# Patient Record
Sex: Female | Born: 1995 | Race: Black or African American | Hispanic: No | Marital: Single | Smoking: Former smoker
Health system: Southern US, Community
[De-identification: ages and names within clinical notes are randomized; demographics above are authoritative.]

## PROBLEM LIST (undated history)

## (undated) ENCOUNTER — Inpatient Hospital Stay (HOSPITAL_COMMUNITY): Payer: Self-pay

## (undated) ENCOUNTER — Ambulatory Visit

## (undated) ENCOUNTER — Emergency Department (HOSPITAL_COMMUNITY): Admission: EM | Payer: Medicaid Other

## (undated) DIAGNOSIS — G43909 Migraine, unspecified, not intractable, without status migrainosus: Secondary | ICD-10-CM

## (undated) HISTORY — PX: HERNIA REPAIR: SHX51

---
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
## 2016-01-01 LAB — GC/CHLAMYDIA PROBE AMP (~~LOC~~) NOT AT ARMC
Chlamydia: NEGATIVE
Neisseria Gonorrhea: NEGATIVE

## 2016-01-01 LAB — WET PREP, GENITAL: Trich, Wet Prep: NONE SEEN

## 2016-01-05 ENCOUNTER — Telehealth: Payer: Self-pay | Admitting: *Deleted

## 2016-01-05 NOTE — Telephone Encounter (Addendum)
Called pt and heard message stating that the person being called cannot accept calls at this time - unable to leave message. Per Dr.Anyanwu pt needs to be informed of wet prep result showing yeast. Rx has been sent to her pharmacy. She may repeat dose if symptoms persist after 3 days.   2/9  1625  Called pt and heard same message stating that the person called cannot accept calls at this time. Certified letter sent.

## 2016-01-07 ENCOUNTER — Encounter: Payer: Self-pay | Admitting: *Deleted

## 2016-06-22 ENCOUNTER — Encounter: Payer: Self-pay | Admitting: Pediatrics

## 2016-06-23 ENCOUNTER — Encounter: Payer: Self-pay | Admitting: Pediatrics

## 2016-07-06 ENCOUNTER — Ambulatory Visit (INDEPENDENT_AMBULATORY_CARE_PROVIDER_SITE_OTHER): Payer: Medicaid Other

## 2016-07-06 ENCOUNTER — Encounter: Payer: Self-pay | Admitting: Family Medicine

## 2016-07-06 DIAGNOSIS — Z3490 Encounter for supervision of normal pregnancy, unspecified, unspecified trimester: Secondary | ICD-10-CM

## 2016-07-06 DIAGNOSIS — Z3201 Encounter for pregnancy test, result positive: Secondary | ICD-10-CM

## 2016-07-06 LAB — POCT PREGNANCY, URINE: Preg Test, Ur: POSITIVE — AB

## 2016-07-06 NOTE — Progress Notes (Signed)
Patient presented to office today for a pregnancy test. Test confirms she is pregnant around five weeks. Patient has been given a pregnancy verification letter to help with applying for pregnancy medicaid. Patient is currently not taking any medications at this time.

## 2016-07-14 ENCOUNTER — Encounter (HOSPITAL_COMMUNITY): Payer: Self-pay | Admitting: *Deleted

## 2016-07-14 ENCOUNTER — Inpatient Hospital Stay (HOSPITAL_COMMUNITY)
Admission: AD | Admit: 2016-07-14 | Discharge: 2016-07-14 | Disposition: A | Discharge status: Home / Self Care | Payer: Medicaid Other | Source: Ambulatory Visit | Attending: Obstetrics & Gynecology | Admitting: Obstetrics & Gynecology

## 2016-07-14 ENCOUNTER — Inpatient Hospital Stay (HOSPITAL_COMMUNITY): Payer: Medicaid Other

## 2016-07-14 DIAGNOSIS — R1110 Vomiting, unspecified: Secondary | ICD-10-CM | POA: Diagnosis present

## 2016-07-14 DIAGNOSIS — Z87891 Personal history of nicotine dependence: Secondary | ICD-10-CM | POA: Diagnosis not present

## 2016-07-14 DIAGNOSIS — R109 Unspecified abdominal pain: Secondary | ICD-10-CM

## 2016-07-14 DIAGNOSIS — O219 Vomiting of pregnancy, unspecified: Secondary | ICD-10-CM | POA: Insufficient documentation

## 2016-07-14 DIAGNOSIS — O99281 Endocrine, nutritional and metabolic diseases complicating pregnancy, first trimester: Secondary | ICD-10-CM | POA: Insufficient documentation

## 2016-07-14 DIAGNOSIS — O26899 Other specified pregnancy related conditions, unspecified trimester: Secondary | ICD-10-CM

## 2016-07-14 DIAGNOSIS — E860 Dehydration: Secondary | ICD-10-CM | POA: Insufficient documentation

## 2016-07-14 DIAGNOSIS — R1030 Lower abdominal pain, unspecified: Secondary | ICD-10-CM | POA: Diagnosis not present

## 2016-07-14 DIAGNOSIS — Z3A01 Less than 8 weeks gestation of pregnancy: Secondary | ICD-10-CM | POA: Diagnosis not present

## 2016-07-14 LAB — URINALYSIS, ROUTINE W REFLEX MICROSCOPIC
GLUCOSE, UA: NEGATIVE mg/dL
Hgb urine dipstick: NEGATIVE
LEUKOCYTES UA: NEGATIVE
Nitrite: NEGATIVE
PROTEIN: 30 mg/dL — AB
Specific Gravity, Urine: >1.03 — ABNORMAL HIGH (ref 1.005–1.030)
pH: 6 (ref 5.0–8.0)

## 2016-07-14 LAB — WET PREP, GENITAL
Sperm: NONE SEEN
Trich, Wet Prep: NONE SEEN
Yeast Wet Prep HPF POC: NONE SEEN

## 2016-07-14 LAB — CBC
HCT: 32.2 % — ABNORMAL LOW (ref 36.0–46.0)
HEMOGLOBIN: 11.7 g/dL — AB (ref 12.0–15.0)
MCH: 33.4 pg (ref 26.0–34.0)
MCHC: 36.3 g/dL — ABNORMAL HIGH (ref 30.0–36.0)
MCV: 92 fL (ref 78.0–100.0)
PLATELETS: 268 10*3/uL (ref 150–400)
RBC: 3.5 MIL/uL — ABNORMAL LOW (ref 3.87–5.11)
RDW: 12.7 % (ref 11.5–15.5)
WBC: 4.8 10*3/uL (ref 4.0–10.5)

## 2016-07-14 LAB — URINE MICROSCOPIC-ADD ON: RBC / HPF: NONE SEEN RBC/hpf (ref 0–5)

## 2016-07-14 LAB — HCG, QUANTITATIVE, PREGNANCY: hCG, Beta Chain, Quant, S: 65953 m[IU]/mL — ABNORMAL HIGH (ref <5)

## 2016-07-14 MED ORDER — PROMETHAZINE HCL 25 MG/ML IJ SOLN
12.5000 mg | Freq: Once | INTRAMUSCULAR | Status: AC
  Administered 2016-07-14: 12.5 mg via INTRAVENOUS
  Filled 2016-07-14: qty 1

## 2016-07-14 MED ORDER — DEXTROSE IN LACTATED RINGERS 5 % IV SOLN
INTRAVENOUS | Status: DC
  Administered 2016-07-14: 20:00:00 via INTRAVENOUS

## 2016-07-14 MED ORDER — PROMETHAZINE HCL 25 MG PO TABS: 25.0000 mg | ORAL_TABLET | Freq: Four times a day (QID) | ORAL | 2 refills | Status: DC | PRN

## 2016-07-14 MED ORDER — DOXYLAMINE-PYRIDOXINE 10-10 MG PO TBEC: 2.0000 | DELAYED_RELEASE_TABLET | Freq: Every day | ORAL | 2 refills | Status: DC

## 2016-07-14 NOTE — MAU Provider Note (Signed)
Chief Complaint: Emesis During Pregnancy and Abdominal Pain   First Provider Initiated Contact with Patient 07/14/16 2016        SUBJECTIVE HPI: Elenor Quinonesubreona Rawlins is a 20 y.o. G1P0 at 6633w5d by LMP who presents to maternity admissions reporting vomiting for one week.   Also c/o lower abdominal pain for a week. . She denies vaginal bleeding, vaginal itching/burning, urinary symptoms, h/a, dizziness, n/v, or fever/chills.    Emesis   This is a new problem. The current episode started in the past 7 days. The problem occurs 5 to 10 times per day. The problem has been unchanged. There has been no fever. Associated symptoms include abdominal pain. Pertinent negatives include no chest pain, chills, coughing, diarrhea, dizziness, fever or headaches. She has tried nothing for the symptoms.  Abdominal Pain  This is a new problem. The current episode started in the past 7 days. The onset quality is gradual. The problem occurs intermittently. The problem has been waxing and waning. The pain is located in the suprapubic region. The pain is mild. The quality of the pain is cramping and dull. The abdominal pain does not radiate. Associated symptoms include vomiting. Pertinent negatives include no constipation, diarrhea, fever or headaches. Nothing aggravates the pain. The pain is relieved by nothing. She has tried nothing for the symptoms.    RN Note: Pt C/O vomiting, unable to hold anything down for the past week.  Vomiting 6-7 times a day. Denies diarrhea.   Also C/O lower abd pain for a week., no bleeding   Past Medical History:  Diagnosis Date  . Migraines    Past Surgical History:  Procedure Laterality Date  . HERNIA REPAIR     Social History   Social History  . Marital status: Single    Spouse name: N/A  . Number of children: N/A  . Years of education: N/A   Occupational History  . Not on file.   Social History Main Topics  . Smoking status: Former Smoker    Quit date: 06/30/2016  .  Smokeless tobacco: Never Used  . Alcohol use No  . Drug use: No  . Sexual activity: Yes    Birth control/ protection: None   Other Topics Concern  . Not on file   Social History Narrative  . No narrative on file   No current facility-administered medications on file prior to encounter.    No current outpatient prescriptions on file prior to encounter.   Allergies  Allergen Reactions  . Imitrex [Sumatriptan] Other (See Comments)    Chest pain, heaviness, migraine    I have reviewed patient's Past Medical Hx, Surgical Hx, Family Hx, Social Hx, medications and allergies.   ROS:  Review of Systems  Constitutional: Negative for chills and fever.  Respiratory: Negative for cough.   Cardiovascular: Negative for chest pain.  Gastrointestinal: Positive for abdominal pain and vomiting. Negative for constipation and diarrhea.  Neurological: Negative for dizziness and headaches.   Other systems negative  Physical Exam  Patient Vitals for the past 24 hrs:  BP Temp Temp src Pulse Resp Height Weight  07/14/16 1849 125/74 98.2 F (36.8 C) Oral 86 16 5\' 5"  (1.651 m) 60.3 kg (133 lb)   Physical Exam  Constitutional: Well-developed  female in no acute distress, but uncomfortable looking.  Cardiovascular: normal rate Respiratory: normal effort GI: Abd soft, non-tender, except for slight tenderness over suprapubic area. Pos BS x 4 MS: Extremities nontender, no edema, normal ROM Neurologic: Alert and oriented  x 4.  GU: Neg CVAT.  PELVIC EXAM: Cervix pink, visually closed, without lesion, scant white creamy discharge, vaginal walls and external genitalia normal Bimanual exam: Cervix 0/long/high, firm, anterior, neg CMT, uterus nontender, nonenlarged, adnexa without tenderness, enlargement, or mass    LAB RESULTS  IMAGING Preliminary US findings Single intrauterine pregnancy Fetal pole measured 5167w1d FHR 129 + yolk sac visible  MAU Management/MDM: Ordered usual first trimester  r/o ectopic labs.   Pelvic exam and cultures done Will check baseline Ultrasound to rule out ectopic.  Given 2 liters of IV fluids Given Phenergan for nausea with some relief US confirms live single IUP  This pain can represent a normal pregnancy with bleeding, spontaneous abortion or even an ectopic which can be life-threatening.  The process as listed above helps to determine which of these is present.    ASSESSMENT Single IUP at 3211w5d Nausea and vomiting of pregnancy Dehydration Lower abdominal pain of unclear etiology  PLAN Discharge home Urine to culture, doubt UTI Will Rx Diclegis and Phenergan for nausea  Encouraged to begin prenatal care Advance diet as tolerated    Pt stable at time of discharge. Encouraged to return here or to other Urgent Care/ED if she develops worsening of symptoms, increase in pain, fever, or other concerning symptoms.    Wynelle BourgeoisMarie Alphus Zeck CNM, MSN Certified Nurse-Midwife 07/14/2016  8:17 PM

## 2016-07-14 NOTE — MAU Note (Signed)
Pt C/O vomiting, unable to hold anything down for the past week.  Vomiting 6-7 times a day. Denies diarrhea.   Also C/O lower abd pain for a week., no bleeding.

## 2016-07-15 LAB — HIV ANTIBODY (ROUTINE TESTING W REFLEX): HIV Screen 4th Generation wRfx: NONREACTIVE

## 2016-07-15 LAB — GC/CHLAMYDIA PROBE AMP (~~LOC~~) NOT AT ARMC
Chlamydia: POSITIVE — AB
NEISSERIA GONORRHEA: NEGATIVE

## 2016-07-16 LAB — CULTURE, OB URINE

## 2016-07-21 ENCOUNTER — Telehealth (HOSPITAL_COMMUNITY): Payer: Self-pay | Admitting: *Deleted

## 2016-07-21 DIAGNOSIS — O98811 Other maternal infectious and parasitic diseases complicating pregnancy, first trimester: Principal | ICD-10-CM

## 2016-07-21 DIAGNOSIS — A749 Chlamydial infection, unspecified: Secondary | ICD-10-CM

## 2016-07-21 MED ORDER — AZITHROMYCIN 500 MG PO TABS: ORAL_TABLET | ORAL | 1 refills | Status: DC

## 2016-07-21 NOTE — Telephone Encounter (Signed)
Telephone call to patient regarding positive chlamydia culture, patient notified.  Rx sent to pharmacy with one refill for partners treatment per protocol.  Partner NKDA verified. Instructed patient to notify partner for treatment and to abstain from sex for seven days post treatment.  Report faxed to health department.

## 2016-07-22 ENCOUNTER — Other Ambulatory Visit: Payer: Self-pay | Admitting: *Deleted

## 2016-07-22 DIAGNOSIS — A749 Chlamydial infection, unspecified: Secondary | ICD-10-CM

## 2016-07-22 DIAGNOSIS — O98811 Other maternal infectious and parasitic diseases complicating pregnancy, first trimester: Principal | ICD-10-CM

## 2016-07-22 MED ORDER — AZITHROMYCIN 500 MG PO TABS: ORAL_TABLET | ORAL | 1 refills | Status: DC

## 2016-07-22 NOTE — Progress Notes (Signed)
Azithromycin was reordered due to provider not recognized by insurance.

## 2016-07-24 ENCOUNTER — Encounter: Payer: Self-pay | Admitting: Advanced Practice Midwife

## 2016-07-24 DIAGNOSIS — A749 Chlamydial infection, unspecified: Secondary | ICD-10-CM | POA: Insufficient documentation

## 2016-07-24 DIAGNOSIS — O98811 Other maternal infectious and parasitic diseases complicating pregnancy, first trimester: Secondary | ICD-10-CM

## 2016-08-10 ENCOUNTER — Encounter: Payer: Self-pay | Admitting: Obstetrics and Gynecology

## 2016-08-10 ENCOUNTER — Ambulatory Visit (INDEPENDENT_AMBULATORY_CARE_PROVIDER_SITE_OTHER): Payer: Medicaid Other | Admitting: Obstetrics and Gynecology

## 2016-08-10 VITALS — BP 111/74 | HR 90 | Temp 98.0°F | Wt 135.3 lb

## 2016-08-10 DIAGNOSIS — A749 Chlamydial infection, unspecified: Secondary | ICD-10-CM

## 2016-08-10 DIAGNOSIS — Z3401 Encounter for supervision of normal first pregnancy, first trimester: Secondary | ICD-10-CM | POA: Diagnosis not present

## 2016-08-10 DIAGNOSIS — O98811 Other maternal infectious and parasitic diseases complicating pregnancy, first trimester: Principal | ICD-10-CM

## 2016-08-10 DIAGNOSIS — Z3400 Encounter for supervision of normal first pregnancy, unspecified trimester: Secondary | ICD-10-CM | POA: Insufficient documentation

## 2016-08-10 DIAGNOSIS — O98311 Other infections with a predominantly sexual mode of transmission complicating pregnancy, first trimester: Secondary | ICD-10-CM | POA: Diagnosis not present

## 2016-08-10 NOTE — Patient Instructions (Signed)
First Trimester of Pregnancy The first trimester of pregnancy is from week 1 until the end of week 12 (months 1 through 3). A week after a sperm fertilizes an egg, the egg will implant on the wall of the uterus. This embryo will begin to develop into a baby. Genes from you and your partner are forming the baby. The female genes determine whether the baby is a boy or a girl. At 6-8 weeks, the eyes and face are formed, and the heartbeat can be seen on ultrasound. At the end of 12 weeks, all the baby's organs are formed.  Now that you are pregnant, you will want to do everything you can to have a healthy baby. Two of the most important things are to get good prenatal care and to follow your health care provider's instructions. Prenatal care is all the medical care you receive before the baby's birth. This care will help prevent, find, and treat any problems during the pregnancy and childbirth. BODY CHANGES Your body goes through many changes during pregnancy. The changes vary from woman to woman.   You may gain or lose a couple of pounds at first.  You may feel sick to your stomach (nauseous) and throw up (vomit). If the vomiting is uncontrollable, call your health care provider.  You may tire easily.  You may develop headaches that can be relieved by medicines approved by your health care provider.  You may urinate more often. Painful urination may mean you have a bladder infection.  You may develop heartburn as a result of your pregnancy.  You may develop constipation because certain hormones are causing the muscles that push waste through your intestines to slow down.  You may develop hemorrhoids or swollen, bulging veins (varicose veins).  Your breasts may begin to grow larger and become tender. Your nipples may stick out more, and the tissue that surrounds them (areola) may become darker.  Your gums may bleed and may be sensitive to brushing and flossing.  Dark spots or blotches  (chloasma, mask of pregnancy) may develop on your face. This will likely fade after the baby is born.  Your menstrual periods will stop.  You may have a loss of appetite.  You may develop cravings for certain kinds of food.  You may have changes in your emotions from day to day, such as being excited to be pregnant or being concerned that something may go wrong with the pregnancy and baby.  You may have more vivid and strange dreams.  You may have changes in your hair. These can include thickening of your hair, rapid growth, and changes in texture. Some women also have hair loss during or after pregnancy, or hair that feels dry or thin. Your hair will most likely return to normal after your baby is born. WHAT TO EXPECT AT YOUR PRENATAL VISITS During a routine prenatal visit:  You will be weighed to make sure you and the baby are growing normally.  Your blood pressure will be taken.  Your abdomen will be measured to track your baby's growth.  The fetal heartbeat will be listened to starting around week 10 or 12 of your pregnancy.  Test results from any previous visits will be discussed. Your health care provider may ask you:  How you are feeling.  If you are feeling the baby move.  If you have had any abnormal symptoms, such as leaking fluid, bleeding, severe headaches, or abdominal cramping.  If you are using any tobacco products,   including cigarettes, chewing tobacco, and electronic cigarettes.  If you have any questions. Other tests that may be performed during your first trimester include:  Blood tests to find your blood type and to check for the presence of any previous infections. They will also be used to check for low iron levels (anemia) and Rh antibodies. Later in the pregnancy, blood tests for diabetes will be done along with other tests if problems develop.  Urine tests to check for infections, diabetes, or protein in the urine.  An ultrasound to confirm the  proper growth and development of the baby.  An amniocentesis to check for possible genetic problems.  Fetal screens for spina bifida and Down syndrome.  You may need other tests to make sure you and the baby are doing well.  HIV (human immunodeficiency virus) testing. Routine prenatal testing includes screening for HIV, unless you choose not to have this test. HOME CARE INSTRUCTIONS  Medicines  Follow your health care provider's instructions regarding medicine use. Specific medicines may be either safe or unsafe to take during pregnancy.  Take your prenatal vitamins as directed.  If you develop constipation, try taking a stool softener if your health care provider approves. Diet  Eat regular, well-balanced meals. Choose a variety of foods, such as meat or vegetable-based protein, fish, milk and low-fat dairy products, vegetables, fruits, and whole grain breads and cereals. Your health care provider will help you determine the amount of weight gain that is right for you.  Avoid raw meat and uncooked cheese. These carry germs that can cause birth defects in the baby.  Eating four or five small meals rather than three large meals a day may help relieve nausea and vomiting. If you start to feel nauseous, eating a few soda crackers can be helpful. Drinking liquids between meals instead of during meals also seems to help nausea and vomiting.  If you develop constipation, eat more high-fiber foods, such as fresh vegetables or fruit and whole grains. Drink enough fluids to keep your urine clear or pale yellow. Activity and Exercise  Exercise only as directed by your health care provider. Exercising will help you:  Control your weight.  Stay in shape.  Be prepared for labor and delivery.  Experiencing pain or cramping in the lower abdomen or low back is a good sign that you should stop exercising. Check with your health care provider before continuing normal exercises.  Try to avoid  standing for long periods of time. Move your legs often if you must stand in one place for a long time.  Avoid heavy lifting.  Wear low-heeled shoes, and practice good posture.  You may continue to have sex unless your health care provider directs you otherwise. Relief of Pain or Discomfort  Wear a good support bra for breast tenderness.   Take warm sitz baths to soothe any pain or discomfort caused by hemorrhoids. Use hemorrhoid cream if your health care provider approves.   Rest with your legs elevated if you have leg cramps or low back pain.  If you develop varicose veins in your legs, wear support hose. Elevate your feet for 15 minutes, 3-4 times a day. Limit salt in your diet. Prenatal Care  Schedule your prenatal visits by the twelfth week of pregnancy. They are usually scheduled monthly at first, then more often in the last 2 months before delivery.  Write down your questions. Take them to your prenatal visits.  Keep all your prenatal visits as directed by your   health care provider. Safety  Wear your seat belt at all times when driving.  Make a list of emergency phone numbers, including numbers for family, friends, the hospital, and police and fire departments. General Tips  Ask your health care provider for a referral to a local prenatal education class. Begin classes no later than at the beginning of month 6 of your pregnancy.  Ask for help if you have counseling or nutritional needs during pregnancy. Your health care provider can offer advice or refer you to specialists for help with various needs.  Do not use hot tubs, steam rooms, or saunas.  Do not douche or use tampons or scented sanitary pads.  Do not cross your legs for long periods of time.  Avoid cat litter boxes and soil used by cats. These carry germs that can cause birth defects in the baby and possibly loss of the fetus by miscarriage or stillbirth.  Avoid all smoking, herbs, alcohol, and medicines  not prescribed by your health care provider. Chemicals in these affect the formation and growth of the baby.  Do not use any tobacco products, including cigarettes, chewing tobacco, and electronic cigarettes. If you need help quitting, ask your health care provider. You may receive counseling support and other resources to help you quit.  Schedule a dentist appointment. At home, brush your teeth with a soft toothbrush and be gentle when you floss. SEEK MEDICAL CARE IF:   You have dizziness.  You have mild pelvic cramps, pelvic pressure, or nagging pain in the abdominal area.  You have persistent nausea, vomiting, or diarrhea.  You have a bad smelling vaginal discharge.  You have pain with urination.  You notice increased swelling in your face, hands, legs, or ankles. SEEK IMMEDIATE MEDICAL CARE IF:   You have a fever.  You are leaking fluid from your vagina.  You have spotting or bleeding from your vagina.  You have severe abdominal cramping or pain.  You have rapid weight gain or loss.  You vomit blood or material that looks like coffee grounds.  You are exposed to German measles and have never had them.  You are exposed to fifth disease or chickenpox.  You develop a severe headache.  You have shortness of breath.  You have any kind of trauma, such as from a fall or a car accident.   This information is not intended to replace advice given to you by your health care provider. Make sure you discuss any questions you have with your health care provider.   Document Released: 11/08/2001 Document Revised: 12/05/2014 Document Reviewed: 09/24/2013 Elsevier Interactive Patient Education 2016 [employer]  Contraception Choices Contraception (birth control) is the use of any methods or devices to prevent pregnancy. Below are some methods to help avoid pregnancy. HORMONAL METHODS   Contraceptive implant. This is a thin, plastic tube containing progesterone hormone. It does  not contain estrogen hormone. Your health care provider inserts the tube in the inner part of the upper arm. The tube can remain in place for up to 3 years. After 3 years, the implant must be removed. The implant prevents the ovaries from releasing an egg (ovulation), thickens the cervical mucus to prevent sperm from entering the uterus, and thins the lining of the inside of the uterus.  Progesterone-only injections. These injections are given every 3 months by your health care provider to prevent pregnancy. This synthetic progesterone hormone stops the ovaries from releasing eggs. It also thickens cervical mucus and changes the   uterine lining. This makes it harder for sperm to survive in the uterus.  Birth control pills. These pills contain estrogen and progesterone hormone. They work by preventing the ovaries from releasing eggs (ovulation). They also cause the cervical mucus to thicken, preventing the sperm from entering the uterus. Birth control pills are prescribed by a health care provider.Birth control pills can also be used to treat heavy periods.  Minipill. This type of birth control pill contains only the progesterone hormone. They are taken every day of each month and must be prescribed by your health care provider.  Birth control patch. The patch contains hormones similar to those in birth control pills. It must be changed once a week and is prescribed by a health care provider.  Vaginal ring. The ring contains hormones similar to those in birth control pills. It is left in the vagina for 3 weeks, removed for 1 week, and then a new one is put back in place. The patient must be comfortable inserting and removing the ring from the vagina.A health care provider's prescription is necessary.  Emergency contraception. Emergency contraceptives prevent pregnancy after unprotected sexual intercourse. This pill can be taken right after sex or up to 5 days after unprotected sex. It is most effective  the sooner you take the pills after having sexual intercourse. Most emergency contraceptive pills are available without a prescription. Check with your pharmacist. Do not use emergency contraception as your only form of birth control. BARRIER METHODS   Female condom. This is a thin sheath (latex or rubber) that is worn over the penis during sexual intercourse. It can be used with spermicide to increase effectiveness.  Female condom. This is a soft, loose-fitting sheath that is put into the vagina before sexual intercourse.  Diaphragm. This is a soft, latex, dome-shaped barrier that must be fitted by a health care provider. It is inserted into the vagina, along with a spermicidal jelly. It is inserted before intercourse. The diaphragm should be left in the vagina for 6 to 8 hours after intercourse.  Cervical cap. This is a round, soft, latex or plastic cup that fits over the cervix and must be fitted by a health care provider. The cap can be left in place for up to 48 hours after intercourse.  Sponge. This is a soft, circular piece of polyurethane foam. The sponge has spermicide in it. It is inserted into the vagina after wetting it and before sexual intercourse.  Spermicides. These are chemicals that kill or block sperm from entering the cervix and uterus. They come in the form of creams, jellies, suppositories, foam, or tablets. They do not require a prescription. They are inserted into the vagina with an applicator before having sexual intercourse. The process must be repeated every time you have sexual intercourse. INTRAUTERINE CONTRACEPTION  Intrauterine device (IUD). This is a T-shaped device that is put in a woman's uterus during a menstrual period to prevent pregnancy. There are 2 types:  Copper IUD. This type of IUD is wrapped in copper wire and is placed inside the uterus. Copper makes the uterus and fallopian tubes produce a fluid that kills sperm. It can stay in place for 10  years.  Hormone IUD. This type of IUD contains the hormone progestin (synthetic progesterone). The hormone thickens the cervical mucus and prevents sperm from entering the uterus, and it also thins the uterine lining to prevent implantation of a fertilized egg. The hormone can weaken or kill the sperm that get into the   uterus. It can stay in place for 3-5 years, depending on which type of IUD is used. PERMANENT METHODS OF CONTRACEPTION  Female tubal ligation. This is when the woman's fallopian tubes are surgically sealed, tied, or blocked to prevent the egg from traveling to the uterus.  Hysteroscopic sterilization. This involves placing a small coil or insert into each fallopian tube. Your doctor uses a technique called hysteroscopy to do the procedure. The device causes scar tissue to form. This results in permanent blockage of the fallopian tubes, so the sperm cannot fertilize the egg. It takes about 3 months after the procedure for the tubes to become blocked. You must use another form of birth control for these 3 months.  Female sterilization. This is when the female has the tubes that carry sperm tied off (vasectomy).This blocks sperm from entering the vagina during sexual intercourse. After the procedure, the man can still ejaculate fluid (semen). NATURAL PLANNING METHODS  Natural family planning. This is not having sexual intercourse or using a barrier method (condom, diaphragm, cervical cap) on days the woman could become pregnant.  Calendar method. This is keeping track of the length of each menstrual cycle and identifying when you are fertile.  Ovulation method. This is avoiding sexual intercourse during ovulation.  Symptothermal method. This is avoiding sexual intercourse during ovulation, using a thermometer and ovulation symptoms.  Post-ovulation method. This is timing sexual intercourse after you have ovulated. Regardless of which type or method of contraception you choose, it is  important that you use condoms to protect against the transmission of sexually transmitted infections (STIs). Talk with your health care provider about which form of contraception is most appropriate for you.   This information is not intended to replace advice given to you by your health care provider. Make sure you discuss any questions you have with your health care provider.   Document Released: 11/14/2005 Document Revised: 11/19/2013 Document Reviewed: 05/09/2013 Elsevier Interactive Patient Education 2016 [employer]  Breastfeeding Deciding to breastfeed is one of the best choices you can make for you and your baby. A change in hormones during pregnancy causes your breast tissue to grow and increases the number and size of your milk ducts. These hormones also allow proteins, sugars, and fats from your blood supply to make breast milk in your milk-producing glands. Hormones prevent breast milk from being released before your baby is born as well as prompt milk flow after birth. Once breastfeeding has begun, thoughts of your baby, as well as his or her sucking or crying, can stimulate the release of milk from your milk-producing glands.  BENEFITS OF BREASTFEEDING For Your Baby  Your first milk (colostrum) helps your baby's digestive system function better.  There are antibodies in your milk that help your baby fight off infections.  Your baby has a lower incidence of asthma, allergies, and sudden infant death syndrome.  The nutrients in breast milk are better for your baby than infant formulas and are designed uniquely for your baby's needs.  Breast milk improves your baby's brain development.  Your baby is less likely to develop other conditions, such as childhood obesity, asthma, or type 2 diabetes mellitus. For You  Breastfeeding helps to create a very special bond between you and your baby.  Breastfeeding is convenient. Breast milk is always available at the correct temperature  and costs nothing.  Breastfeeding helps to burn calories and helps you lose the weight gained during pregnancy.  Breastfeeding makes your uterus contract to its   prepregnancy size faster and slows bleeding (lochia) after you give birth.   Breastfeeding helps to lower your risk of developing type 2 diabetes mellitus, osteoporosis, and breast or ovarian cancer later in life. SIGNS THAT YOUR BABY IS HUNGRY Early Signs of Hunger  Increased alertness or activity.  Stretching.  Movement of the head from side to side.  Movement of the head and opening of the mouth when the corner of the mouth or cheek is stroked (rooting).  Increased sucking sounds, smacking lips, cooing, sighing, or squeaking.  Hand-to-mouth movements.  Increased sucking of fingers or hands. Late Signs of Hunger  Fussing.  Intermittent crying. Extreme Signs of Hunger Signs of extreme hunger will require calming and consoling before your baby will be able to breastfeed successfully. Do not wait for the following signs of extreme hunger to occur before you initiate breastfeeding:  Restlessness.  A loud, strong cry.  Screaming. BREASTFEEDING BASICS Breastfeeding Initiation  Find a comfortable place to sit or lie down, with your neck and back well supported.  Place a pillow or rolled up blanket under your baby to bring him or her to the level of your breast (if you are seated). Nursing pillows are specially designed to help support your arms and your baby while you breastfeed.  Make sure that your baby's abdomen is facing your abdomen.  Gently massage your breast. With your fingertips, massage from your chest wall toward your nipple in a circular motion. This encourages milk flow. You may need to continue this action during the feeding if your milk flows slowly.  Support your breast with 4 fingers underneath and your thumb above your nipple. Make sure your fingers are well away from your nipple and your baby's  mouth.  Stroke your baby's lips gently with your finger or nipple.  When your baby's mouth is open wide enough, quickly bring your baby to your breast, placing your entire nipple and as much of the colored area around your nipple (areola) as possible into your baby's mouth.  More areola should be visible above your baby's upper lip than below the lower lip.  Your baby's tongue should be between his or her lower gum and your breast.  Ensure that your baby's mouth is correctly positioned around your nipple (latched). Your baby's lips should create a seal on your breast and be turned out (everted).  It is common for your baby to suck about 2-3 minutes in order to start the flow of breast milk. Latching Teaching your baby how to latch on to your breast properly is very important. An improper latch can cause nipple pain and decreased milk supply for you and poor weight gain in your baby. Also, if your baby is not latched onto your nipple properly, he or she may swallow some air during feeding. This can make your baby fussy. Burping your baby when you switch breasts during the feeding can help to get rid of the air. However, teaching your baby to latch on properly is still the best way to prevent fussiness from swallowing air while breastfeeding. Signs that your baby has successfully latched on to your nipple:  Silent tugging or silent sucking, without causing you pain.  Swallowing heard between every 3-4 sucks.  Muscle movement above and in front of his or her ears while sucking. Signs that your baby has not successfully latched on to nipple:  Sucking sounds or smacking sounds from your baby while breastfeeding.  Nipple pain. If you think your baby has   not latched on correctly, slip your finger into the corner of your baby's mouth to break the suction and place it between your baby's gums. Attempt breastfeeding initiation again. Signs of Successful Breastfeeding Signs from your baby:  A  gradual decrease in the number of sucks or complete cessation of sucking.  Falling asleep.  Relaxation of his or her body.  Retention of a small amount of milk in his or her mouth.  Letting go of your breast by himself or herself. Signs from you:  Breasts that have increased in firmness, weight, and size 1-3 hours after feeding.  Breasts that are softer immediately after breastfeeding.  Increased milk volume, as well as a change in milk consistency and color by the fifth day of breastfeeding.  Nipples that are not sore, cracked, or bleeding. Signs That Your Baby is Getting Enough Milk  Wetting at least 3 diapers in a 24-hour period. The urine should be clear and pale yellow by age 5 days.  At least 3 stools in a 24-hour period by age 5 days. The stool should be soft and yellow.  At least 3 stools in a 24-hour period by age 7 days. The stool should be seedy and yellow.  No loss of weight greater than 10% of birth weight during the first 3 days of age.  Average weight gain of 4-7 ounces (113-198 g) per week after age 4 days.  Consistent daily weight gain by age 5 days, without weight loss after the age of 2 weeks. After a feeding, your baby may spit up a small amount. This is common. BREASTFEEDING FREQUENCY AND DURATION Frequent feeding will help you make more milk and can prevent sore nipples and breast engorgement. Breastfeed when you feel the need to reduce the fullness of your breasts or when your baby shows signs of hunger. This is called "breastfeeding on demand." Avoid introducing a pacifier to your baby while you are working to establish breastfeeding (the first 4-6 weeks after your baby is born). After this time you may choose to use a pacifier. Research has shown that pacifier use during the first year of a baby's life decreases the risk of sudden infant death syndrome (SIDS). Allow your baby to feed on each breast as long as he or she wants. Breastfeed until your baby is  finished feeding. When your baby unlatches or falls asleep while feeding from the first breast, offer the second breast. Because newborns are often sleepy in the first few weeks of life, you may need to awaken your baby to get him or her to feed. Breastfeeding times will vary from baby to baby. However, the following rules can serve as a guide to help you ensure that your baby is properly fed:  Newborns (babies 4 weeks of age or younger) may breastfeed every 1-3 hours.  Newborns should not go longer than 3 hours during the day or 5 hours during the night without breastfeeding.  You should breastfeed your baby a minimum of 8 times in a 24-hour period until you begin to introduce solid foods to your baby at around 6 months of age. BREAST MILK PUMPING Pumping and storing breast milk allows you to ensure that your baby is exclusively fed your breast milk, even at times when you are unable to breastfeed. This is especially important if you are going back to work while you are still breastfeeding or when you are not able to be present during feedings. Your lactation consultant can give you guidelines on   how long it is safe to store breast milk. A breast pump is a machine that allows you to pump milk from your breast into a sterile bottle. The pumped breast milk can then be stored in a refrigerator or freezer. Some breast pumps are operated by hand, while others use electricity. Ask your lactation consultant which type will work best for you. Breast pumps can be purchased, but some hospitals and breastfeeding support groups lease breast pumps on a monthly basis. A lactation consultant can teach you how to hand express breast milk, if you prefer not to use a pump. CARING FOR YOUR BREASTS WHILE YOU BREASTFEED Nipples can become dry, cracked, and sore while breastfeeding. The following recommendations can help keep your breasts moisturized and healthy:  Avoid using soap on your nipples.  Wear a supportive bra.  Although not required, special nursing bras and tank tops are designed to allow access to your breasts for breastfeeding without taking off your entire bra or top. Avoid wearing underwire-style bras or extremely tight bras.  Air dry your nipples for 3-4minutes after each feeding.  Use only cotton bra pads to absorb leaked breast milk. Leaking of breast milk between feedings is normal.  Use lanolin on your nipples after breastfeeding. Lanolin helps to maintain your skin's normal moisture barrier. If you use pure lanolin, you do not need to wash it off before feeding your baby again. Pure lanolin is not toxic to your baby. You may also hand express a few drops of breast milk and gently massage that milk into your nipples and allow the milk to air dry. In the first few weeks after giving birth, some women experience extremely full breasts (engorgement). Engorgement can make your breasts feel heavy, warm, and tender to the touch. Engorgement peaks within 3-5 days after you give birth. The following recommendations can help ease engorgement:  Completely empty your breasts while breastfeeding or pumping. You may want to start by applying warm, moist heat (in the shower or with warm water-soaked hand towels) just before feeding or pumping. This increases circulation and helps the milk flow. If your baby does not completely empty your breasts while breastfeeding, pump any extra milk after he or she is finished.  Wear a snug bra (nursing or regular) or tank top for 1-2 days to signal your body to slightly decrease milk production.  Apply ice packs to your breasts, unless this is too uncomfortable for you.  Make sure that your baby is latched on and positioned properly while breastfeeding. If engorgement persists after 48 hours of following these recommendations, contact your health care provider or a lactation consultant. OVERALL HEALTH CARE RECOMMENDATIONS WHILE BREASTFEEDING  Eat healthy foods.  Alternate between meals and snacks, eating 3 of each per day. Because what you eat affects your breast milk, some of the foods may make your baby more irritable than usual. Avoid eating these foods if you are sure that they are negatively affecting your baby.  Drink milk, fruit juice, and water to satisfy your thirst (about 10 glasses a day).  Rest often, relax, and continue to take your prenatal vitamins to prevent fatigue, stress, and anemia.  Continue breast self-awareness checks.  Avoid chewing and smoking tobacco. Chemicals from cigarettes that pass into breast milk and exposure to secondhand smoke may harm your baby.  Avoid alcohol and drug use, including marijuana. Some medicines that may be harmful to your baby can pass through breast milk. It is important to ask your health care provider   before taking any medicine, including all over-the-counter and prescription medicine as well as vitamin and herbal supplements. It is possible to become pregnant while breastfeeding. If birth control is desired, ask your health care provider about options that will be safe for your baby. SEEK MEDICAL CARE IF:  You feel like you want to stop breastfeeding or have become frustrated with breastfeeding.  You have painful breasts or nipples.  Your nipples are cracked or bleeding.  Your breasts are red, tender, or warm.  You have a swollen area on either breast.  You have a fever or chills.  You have nausea or vomiting.  You have drainage other than breast milk from your nipples.  Your breasts do not become full before feedings by the fifth day after you give birth.  You feel sad and depressed.  Your baby is too sleepy to eat well.  Your baby is having trouble sleeping.   Your baby is wetting less than 3 diapers in a 24-hour period.  Your baby has less than 3 stools in a 24-hour period.  Your baby's skin or the white part of his or her eyes becomes yellow.   Your baby is not gaining  weight by 5 days of age. SEEK IMMEDIATE MEDICAL CARE IF:  Your baby is overly tired (lethargic) and does not want to wake up and feed.  Your baby develops an unexplained fever.   This information is not intended to replace advice given to you by your health care provider. Make sure you discuss any questions you have with your health care provider.   Document Released: 11/14/2005 Document Revised: 08/05/2015 Document Reviewed: 05/08/2013 Elsevier Interactive Patient Education 2016 [employer]  

## 2016-08-10 NOTE — Progress Notes (Signed)
  Subjective:    Adriana [name] is a G1P0 6033w4d being seen today for her first obstetrical visit.  Her obstetrical history is significant for first pregnancy and chlamydia infection in first trimester. Patient does intend to breast feed. Pregnancy history fully reviewed.  Patient reports some nausea which is becoming manageable. She has a prescription for phenergan already  Vitals:   08/10/16 0821  BP: 111/74  Pulse: 90  Temp: 98 F (36.7 C)  Weight: 135 lb 4.8 oz (61.4 kg)    HISTORY: OB History  Gravida Para Term Preterm AB Living  1            SAB TAB Ectopic Multiple Live Births               # Outcome Date GA Lbr Len/2nd Weight Sex Delivery Anes PTL Lv  1 Current              Past Medical History:  Diagnosis Date  . Migraines    Past Surgical History:  Procedure Laterality Date  . HERNIA REPAIR     No family history on file.   Exam    Uterus:   10- week size  Pelvic Exam:    Perineum: Normal Perineum   Vulva: normal   Vagina:  normal mucosa, normal discharge   pH:    Cervix: nulliparous appearance and cervix is closed and long   Adnexa: no mass, fullness, tenderness   Bony Pelvis: gynecoid  System: Breast:  normal appearance, no masses or tenderness   Skin: normal coloration and turgor, no rashes    Neurologic: oriented, no focal deficits   Extremities: normal strength, tone, and muscle mass   HEENT extra ocular movement intact   Mouth/Teeth mucous membranes moist, pharynx normal without lesions and dental hygiene good   Neck supple and no masses   Cardiovascular: regular rate and rhythm   Respiratory:  chest clear, no wheezing, crepitations, rhonchi, normal symmetric air entry   Abdomen: soft, non-tender; bowel sounds normal; no masses,  no organomegaly   Urinary:       Assessment:    Pregnancy: G1P0 Patient Active Problem List   Diagnosis Date Noted  . Supervision of normal first pregnancy, antepartum 08/10/2016  . Chlamydia infection  affecting pregnancy in first trimester, antepartum 07/24/2016  . Migraine headache with aura 04/16/2014        Plan:     Initial labs drawn. Prenatal vitamins. Problem list reviewed and updated. Genetic Screening discussed First Screen: ordered.  Ultrasound discussed; fetal survey: requested.  Follow up in 4 weeks. 50% of 30 min visit spent on counseling and coordination of care.     Adriana [name] 08/10/2016

## 2016-08-11 ENCOUNTER — Encounter: Payer: Self-pay | Admitting: Obstetrics and Gynecology

## 2016-08-11 DIAGNOSIS — O09899 Supervision of other high risk pregnancies, unspecified trimester: Secondary | ICD-10-CM | POA: Insufficient documentation

## 2016-08-11 DIAGNOSIS — Z283 Underimmunization status: Secondary | ICD-10-CM

## 2016-08-11 DIAGNOSIS — Z2839 Other underimmunization status: Secondary | ICD-10-CM | POA: Insufficient documentation

## 2016-08-11 LAB — GC/CHLAMYDIA PROBE AMP
Chlamydia trachomatis, NAA: NEGATIVE
NEISSERIA GONORRHOEAE BY PCR: NEGATIVE

## 2016-08-13 LAB — CULTURE, OB URINE

## 2016-08-13 LAB — URINE CULTURE, OB REFLEX

## 2016-08-16 ENCOUNTER — Encounter (HOSPITAL_COMMUNITY): Payer: Self-pay | Admitting: Obstetrics and Gynecology

## 2016-08-19 LAB — PRENATAL PROFILE I(LABCORP)
Antibody Screen: NEGATIVE
BASOS: 0 %
Basophils Absolute: 0 10*3/uL (ref 0.0–0.2)
EOS (ABSOLUTE): 0.1 10*3/uL (ref 0.0–0.4)
EOS: 1 %
HEMATOCRIT: 33.7 % — AB (ref 34.0–46.6)
HEP B S AG: NEGATIVE
Hemoglobin: 11.8 g/dL (ref 11.1–15.9)
Immature Grans (Abs): 0 10*3/uL (ref 0.0–0.1)
Immature Granulocytes: 0 %
Lymphocytes Absolute: 2.1 10*3/uL (ref 0.7–3.1)
Lymphs: 37 %
MCH: 33.7 pg — ABNORMAL HIGH (ref 26.6–33.0)
MCHC: 35 g/dL (ref 31.5–35.7)
MCV: 96 fL (ref 79–97)
MONOCYTES: 6 %
Monocytes Absolute: 0.4 10*3/uL (ref 0.1–0.9)
Neutrophils Absolute: 3.3 10*3/uL (ref 1.4–7.0)
Neutrophils: 56 %
Platelets: 288 10*3/uL (ref 150–379)
RBC: 3.5 x10E6/uL — ABNORMAL LOW (ref 3.77–5.28)
RDW: 13.9 % (ref 12.3–15.4)
RH TYPE: POSITIVE
RPR: NONREACTIVE
RUBELLA: 4.48 {index} (ref >0.99)
WBC: 5.8 10*3/uL (ref 3.4–10.8)

## 2016-08-19 LAB — HEMOGLOBINOPATHY EVALUATION
HGB C: 0 %
HGB S: 0 %
Hemoglobin A2 Quantitation: 2.2 % (ref 0.7–3.1)
Hemoglobin F Quantitation: 0 % (ref 0.0–2.0)
Hgb A: 97.8 % (ref 94.0–98.0)

## 2016-08-19 LAB — HIV ANTIBODY (ROUTINE TESTING W REFLEX): HIV SCREEN 4TH GENERATION: NONREACTIVE

## 2016-08-19 LAB — TOXASSURE SELECT 13 (MW), URINE

## 2016-08-19 LAB — CYSTIC FIBROSIS MUTATION 97: GENE DIS ANAL CARRIER INTERP BLD/T-IMP: NOT DETECTED

## 2016-08-19 LAB — VARICELLA ZOSTER ANTIBODY, IGG

## 2016-08-24 ENCOUNTER — Encounter (HOSPITAL_COMMUNITY): Payer: Self-pay

## 2016-08-24 ENCOUNTER — Ambulatory Visit (HOSPITAL_COMMUNITY)
Admission: RE | Admit: 2016-08-24 | Discharge: 2016-08-24 | Disposition: A | Discharge status: Home / Self Care | Payer: Medicaid Other | Source: Ambulatory Visit | Attending: Obstetrics and Gynecology | Admitting: Obstetrics and Gynecology

## 2016-08-24 ENCOUNTER — Other Ambulatory Visit: Payer: Self-pay | Admitting: Obstetrics and Gynecology

## 2016-08-24 DIAGNOSIS — Z3682 Encounter for antenatal screening for nuchal translucency: Secondary | ICD-10-CM

## 2016-08-24 DIAGNOSIS — Z36 Encounter for antenatal screening of mother: Secondary | ICD-10-CM | POA: Diagnosis not present

## 2016-08-24 DIAGNOSIS — Z3A12 12 weeks gestation of pregnancy: Secondary | ICD-10-CM | POA: Diagnosis not present

## 2016-08-24 DIAGNOSIS — Z3401 Encounter for supervision of normal first pregnancy, first trimester: Secondary | ICD-10-CM

## 2016-09-07 ENCOUNTER — Other Ambulatory Visit (HOSPITAL_COMMUNITY)
Admission: RE | Admit: 2016-09-07 | Discharge: 2016-09-07 | Disposition: A | Discharge status: Home / Self Care | Payer: Medicaid Other | Source: Ambulatory Visit | Attending: Obstetrics and Gynecology | Admitting: Obstetrics and Gynecology

## 2016-09-07 ENCOUNTER — Ambulatory Visit (INDEPENDENT_AMBULATORY_CARE_PROVIDER_SITE_OTHER): Payer: Medicaid Other | Admitting: Obstetrics and Gynecology

## 2016-09-07 VITALS — BP 122/73 | HR 101 | Wt 135.0 lb

## 2016-09-07 DIAGNOSIS — O98312 Other infections with a predominantly sexual mode of transmission complicating pregnancy, second trimester: Secondary | ICD-10-CM

## 2016-09-07 DIAGNOSIS — Z23 Encounter for immunization: Secondary | ICD-10-CM | POA: Diagnosis not present

## 2016-09-07 DIAGNOSIS — A749 Chlamydial infection, unspecified: Secondary | ICD-10-CM

## 2016-09-07 DIAGNOSIS — O98811 Other maternal infectious and parasitic diseases complicating pregnancy, first trimester: Secondary | ICD-10-CM

## 2016-09-07 DIAGNOSIS — Z3482 Encounter for supervision of other normal pregnancy, second trimester: Secondary | ICD-10-CM

## 2016-09-07 DIAGNOSIS — Z113 Encounter for screening for infections with a predominantly sexual mode of transmission: Secondary | ICD-10-CM | POA: Insufficient documentation

## 2016-09-07 DIAGNOSIS — O09899 Supervision of other high risk pregnancies, unspecified trimester: Secondary | ICD-10-CM

## 2016-09-07 DIAGNOSIS — Z283 Underimmunization status: Secondary | ICD-10-CM

## 2016-09-07 DIAGNOSIS — Z3400 Encounter for supervision of normal first pregnancy, unspecified trimester: Secondary | ICD-10-CM

## 2016-09-07 NOTE — Addendum Note (Signed)
Addended by: Hermina StaggersERVIN, MICHAEL L on: 09/07/2016 09:12 AM   Modules accepted: Orders

## 2016-09-07 NOTE — Progress Notes (Signed)
Subjective:  Adriana [name] is a 20 y.o. G1P0 at 295w4d being seen today for ongoing prenatal care.  She is currently monitored for the following issues for this low-risk pregnancy and has Migraine headache with aura; Chlamydia infection affecting pregnancy in first trimester, antepartum; Supervision of normal first pregnancy, antepartum; and Susceptible to varicella (non-immune), currently pregnant on her problem list.  Patient reports no complaints.  Contractions: Not present. Vag. Bleeding: None.  Movement: Absent. Denies leaking of fluid.   The following portions of the patient's history were reviewed and updated as appropriate: allergies, current medications, past family history, past medical history, past social history, past surgical history and problem list. Problem list updated.  Objective:   Vitals:   09/07/16 0828  BP: 122/73  Pulse: (!) 101  Weight: 135 lb (61.2 kg)    Fetal Status: Fetal Heart Rate (bpm): 158   Movement: Absent     General:  Alert, oriented and cooperative. Patient is in no acute distress.  Skin: Skin is warm and dry. No rash noted.   Cardiovascular: Normal heart rate noted  Respiratory: Normal respiratory effort, no problems with respiration noted  Abdomen: Soft, gravid, appropriate for gestational age. Pain/Pressure: Present     Pelvic:  Cervical exam deferred        Extremities: Normal range of motion.  Edema: None  Mental Status: Normal mood and affect. Normal behavior. Normal judgment and thought content.   Urinalysis:      Assessment and Plan:  Pregnancy: G1P0 at 635w4d  1. Susceptible to varicella (non-immune), currently pregnant Vaccine PP  2. Supervision of normal first pregnancy, antepartum Flu vaccine today Anatomy U/S ordered   3. Chlamydia infection affecting pregnancy in first trimester, antepartum - GC/Chlamydia Probe Amp  Preterm labor symptoms and general obstetric precautions including but not limited to vaginal bleeding,  contractions, leaking of fluid and fetal movement were reviewed in detail with the patient. Please refer to After Visit Summary for other counseling recommendations.  No Follow-up on file.   Hermina StaggersMichael L Taite Baldassari, MD

## 2016-09-08 LAB — GC/CHLAMYDIA PROBE AMP (~~LOC~~) NOT AT ARMC
Chlamydia: NEGATIVE
NEISSERIA GONORRHEA: NEGATIVE

## 2016-10-05 ENCOUNTER — Ambulatory Visit (INDEPENDENT_AMBULATORY_CARE_PROVIDER_SITE_OTHER): Payer: Medicaid Other | Admitting: Obstetrics & Gynecology

## 2016-10-05 VITALS — BP 112/76 | HR 86 | Temp 98.3°F | Wt 145.6 lb

## 2016-10-05 DIAGNOSIS — Z3402 Encounter for supervision of normal first pregnancy, second trimester: Secondary | ICD-10-CM

## 2016-10-05 DIAGNOSIS — Z3400 Encounter for supervision of normal first pregnancy, unspecified trimester: Secondary | ICD-10-CM

## 2016-10-05 DIAGNOSIS — G43101 Migraine with aura, not intractable, with status migrainosus: Secondary | ICD-10-CM

## 2016-10-05 MED ORDER — FAMOTIDINE 40 MG PO TABS: 40.0000 mg | ORAL_TABLET | Freq: Every day | ORAL | 4 refills | Status: DC

## 2016-10-05 MED ORDER — AMITRIPTYLINE HCL 10 MG PO TABS: 10.0000 mg | ORAL_TABLET | Freq: Every day | ORAL | 2 refills | Status: DC

## 2016-10-05 NOTE — Progress Notes (Signed)
   PRENATAL VISIT NOTE  Subjective:  Adriana [name] is a 20 y.o. G1P0 at 829w4d being seen today for ongoing prenatal care.  She is currently monitored for the following issues for this low-risk pregnancy and has Migraine headache with aura; Chlamydia infection affecting pregnancy in first trimester, antepartum; Supervision of normal first pregnancy, antepartum; and Susceptible to varicella (non-immune), currently pregnant on her problem list.  Patient reports headache and heartburn.  Contractions: Not present. Vag. Bleeding: None.  Movement: Present. Denies leaking of fluid.   The following portions of the patient's history were reviewed and updated as appropriate: allergies, current medications, past family history, past medical history, past social history, past surgical history and problem list. Problem list updated.  Objective:   Vitals:   10/05/16 0844  BP: 112/76  Pulse: 86  Temp: 98.3 F (36.8 C)  Weight: 145 lb 9.6 oz (66 kg)    Fetal Status: Fetal Heart Rate (bpm): 143   Movement: Present     General:  Alert, oriented and cooperative. Patient is in no acute distress.  Skin: Skin is warm and dry. No rash noted.   Cardiovascular: Normal heart rate noted  Respiratory: Normal respiratory effort, no problems with respiration noted  Abdomen: Soft, gravid, appropriate for gestational age. Pain/Pressure: Absent     Pelvic:  Cervical exam deferred        Extremities: Normal range of motion.  Edema: None  Mental Status: Normal mood and affect. Normal behavior. Normal judgment and thought content.   Assessment and Plan:  Pregnancy: G1P0 at 2429w4d  1. Supervision of normal first pregnancy, antepartum Reflux sx - famotidine (PEPCID) 40 MG tablet; Take 1 tablet (40 mg total) by mouth daily.  Dispense: 30 tablet; Refill: 4 - amitriptyline (ELAVIL) 10 MG tablet; Take 1 tablet (10 mg total) by mouth at bedtime.  Dispense: 30 tablet; Refill: 2  2. Migraine with aura , not  intractable 3/week, has photophobia, will try prevention  - amitriptyline (ELAVIL) 10 MG tablet; Take 1 tablet (10 mg total) by mouth at bedtime.  Dispense: 30 tablet; Refill: 2  Preterm labor symptoms and general obstetric precautions including but not limited to vaginal bleeding, contractions, leaking of fluid and fetal movement were reviewed in detail with the patient. Please refer to After Visit Summary for other counseling recommendations.  Return in about 4 weeks (around 11/02/2016). [facility]  Adam PhenixJames G Hopelynn Gartland, MD

## 2016-10-05 NOTE — Progress Notes (Signed)
Patient says that she feels good, pt reports feeling fetal movement.

## 2016-10-07 ENCOUNTER — Ambulatory Visit (HOSPITAL_COMMUNITY)
Admission: RE | Admit: 2016-10-07 | Discharge: 2016-10-07 | Disposition: A | Discharge status: Home / Self Care | Payer: Medicaid Other | Source: Ambulatory Visit | Attending: Obstetrics and Gynecology | Admitting: Obstetrics and Gynecology

## 2016-10-07 DIAGNOSIS — Z363 Encounter for antenatal screening for malformations: Secondary | ICD-10-CM | POA: Insufficient documentation

## 2016-10-07 DIAGNOSIS — Z3A18 18 weeks gestation of pregnancy: Secondary | ICD-10-CM | POA: Insufficient documentation

## 2016-10-07 DIAGNOSIS — Z3400 Encounter for supervision of normal first pregnancy, unspecified trimester: Secondary | ICD-10-CM

## 2016-10-10 ENCOUNTER — Other Ambulatory Visit: Payer: Self-pay | Admitting: *Deleted

## 2016-10-10 DIAGNOSIS — Z0489 Encounter for examination and observation for other specified reasons: Secondary | ICD-10-CM

## 2016-10-10 DIAGNOSIS — IMO0002 Reserved for concepts with insufficient information to code with codable children: Secondary | ICD-10-CM

## 2016-10-21 ENCOUNTER — Inpatient Hospital Stay (HOSPITAL_COMMUNITY)
Admission: AD | Admit: 2016-10-21 | Discharge: 2016-10-21 | Disposition: A | Discharge status: Home / Self Care | Payer: Medicaid Other | Source: Ambulatory Visit | Attending: Obstetrics & Gynecology | Admitting: Obstetrics & Gynecology

## 2016-10-21 ENCOUNTER — Encounter (HOSPITAL_COMMUNITY): Payer: Self-pay | Admitting: *Deleted

## 2016-10-21 DIAGNOSIS — O09899 Supervision of other high risk pregnancies, unspecified trimester: Secondary | ICD-10-CM

## 2016-10-21 DIAGNOSIS — R109 Unspecified abdominal pain: Secondary | ICD-10-CM | POA: Insufficient documentation

## 2016-10-21 DIAGNOSIS — N760 Acute vaginitis: Secondary | ICD-10-CM | POA: Diagnosis not present

## 2016-10-21 DIAGNOSIS — Z3400 Encounter for supervision of normal first pregnancy, unspecified trimester: Secondary | ICD-10-CM

## 2016-10-21 DIAGNOSIS — A749 Chlamydial infection, unspecified: Secondary | ICD-10-CM

## 2016-10-21 DIAGNOSIS — B9689 Other specified bacterial agents as the cause of diseases classified elsewhere: Secondary | ICD-10-CM | POA: Diagnosis not present

## 2016-10-21 DIAGNOSIS — G43909 Migraine, unspecified, not intractable, without status migrainosus: Secondary | ICD-10-CM | POA: Diagnosis not present

## 2016-10-21 DIAGNOSIS — Z2839 Other underimmunization status: Secondary | ICD-10-CM

## 2016-10-21 DIAGNOSIS — Z283 Underimmunization status: Secondary | ICD-10-CM

## 2016-10-21 DIAGNOSIS — O98811 Other maternal infectious and parasitic diseases complicating pregnancy, first trimester: Secondary | ICD-10-CM

## 2016-10-21 LAB — URINALYSIS, ROUTINE W REFLEX MICROSCOPIC
Bilirubin Urine: NEGATIVE
GLUCOSE, UA: NEGATIVE mg/dL
Hgb urine dipstick: NEGATIVE
Ketones, ur: NEGATIVE mg/dL
LEUKOCYTES UA: NEGATIVE
Nitrite: NEGATIVE
PH: 7 (ref 5.0–8.0)
Protein, ur: NEGATIVE mg/dL
SPECIFIC GRAVITY, URINE: 1.015 (ref 1.005–1.030)

## 2016-10-21 LAB — WET PREP, GENITAL
Sperm: NONE SEEN
TRICH WET PREP: NONE SEEN
Yeast Wet Prep HPF POC: NONE SEEN

## 2016-10-21 MED ORDER — METRONIDAZOLE 500 MG PO TABS: 500.0000 mg | ORAL_TABLET | Freq: Three times a day (TID) | ORAL | 0 refills | Status: AC

## 2016-10-21 MED ORDER — METRONIDAZOLE 500 MG PO TABS: 500.0000 mg | ORAL_TABLET | Freq: Three times a day (TID) | ORAL | 0 refills | Status: DC

## 2016-10-21 NOTE — Discharge Instructions (Signed)

## 2016-10-21 NOTE — MAU Note (Signed)
Pt C/O L mid side pain since last night, hurts for 2-3 hours at a time, unable to sleep.  Vomited yesterday but not today, denies diarrhea.  No bleeding.

## 2016-10-21 NOTE — MAU Provider Note (Signed)
History     CSN: [id number]  Arrival date and time: 10/21/16 1759   None     Chief Complaint  Patient presents with  . Abdominal Pain   Abdominal Pain  This is a new problem. The current episode started yesterday. The onset quality is sudden. The problem occurs intermittently. The most recent episode lasted 2 hours. The problem has been unchanged. The pain is located in the epigastric region. Associated symptoms include nausea. Pertinent negatives include no constipation, diarrhea or melena.    OB History    Gravida Para Term Preterm AB Living   1             SAB TAB Ectopic Multiple Live Births                  Past Medical History:  Diagnosis Date  . Migraines     Past Surgical History:  Procedure Laterality Date  . HERNIA REPAIR      History reviewed. No pertinent family history.  Social History  Substance Use Topics  . Smoking status: Former Smoker    Quit date: 06/30/2016  . Smokeless tobacco: Never Used  . Alcohol use No    Allergies:  Allergies  Allergen Reactions  . Imitrex [Sumatriptan] Other (See Comments)    Chest pain, heaviness, migraine  . Latex Rash    Prescriptions Prior to Admission  Medication Sig Dispense Refill Last Dose  . acetaminophen (TYLENOL) 325 MG tablet Take 650 mg by mouth every 6 (six) hours as needed for mild pain or headache.   10/21/2016 at Unknown time  . Prenatal Vit-Fe Fumarate-FA (PRENATAL MULTIVITAMIN) TABS tablet Take 1 tablet by mouth daily at 12 noon.   10/21/2016 at Unknown time  . amitriptyline (ELAVIL) 10 MG tablet Take 1 tablet (10 mg total) by mouth at bedtime. (Patient not taking: Reported on 10/21/2016) 30 tablet 2 Not Taking at Unknown time  . Doxylamine-Pyridoxine (DICLEGIS) 10-10 MG TBEC Take 2 tablets by mouth at bedtime. (Patient not taking: Reported on 10/05/2016) 100 tablet 2 Not Taking  . famotidine (PEPCID) 40 MG tablet Take 1 tablet (40 mg total) by mouth daily. (Patient not taking: Reported on  10/21/2016) 30 tablet 4 Not Taking at Unknown time  . promethazine (PHENERGAN) 25 MG tablet Take 1 tablet (25 mg total) by mouth every 6 (six) hours as needed for nausea or vomiting. (Patient not taking: Reported on 10/05/2016) 30 tablet 2 Not Taking    Review of Systems  Constitutional: Negative.   HENT: Negative.   Eyes: Negative.   Respiratory: Negative.   Cardiovascular: Negative.   Gastrointestinal: Positive for abdominal pain and nausea. Negative for blood in stool, constipation, diarrhea, heartburn and melena.       Patient ate a large meal yesterday and then threw up. Today she ate macaroni and cheese, mashed potatoes and a ham sandwich and has not thrown up.   Genitourinary: Negative.   Musculoskeletal: Negative.   Skin: Negative.   Neurological: Negative.   Endo/Heme/Allergies: Negative.    Physical Exam   Blood pressure 120/65, pulse 79, temperature 98 F (36.7 C), temperature source Oral, resp. rate 18, height 5\' 6"  (1.676 m), weight 67.1 kg (148 lb), last menstrual period 05/28/2016.  Physical Exam  Constitutional: She is oriented to person, place, and time. She appears well-developed and well-nourished.  Neck: Normal range of motion.  Respiratory: Effort normal. No respiratory distress.  GI: Soft. Bowel sounds are normal. She exhibits no distension and no mass.  There is no tenderness. There is no rebound and no guarding.  Genitourinary: Vagina normal and uterus normal.  Genitourinary Comments: External labia normal with no lesions, no discharge. Cervix without lesions, no erythema. No CMT.   Musculoskeletal: Normal range of motion.  Neurological: She is alert and oriented to person, place, and time.    MAU Course  Procedures  MDM -Wet prep-Clue cells present -GC CT pending -speculum and pelvic-unremarkable  Assessment and Plan   Patient Adriana [name] is a 20 year old G1P0 here with left-sided upper abdominal pain. She denies headache, blurry vision or  epigastric pain. Had two bowel movements this morning; no nausea and vomiting or lack of appetite today. Patient is resting comfortably in bed talking with [name] in no visible distress. Patient to be discharged home with comfort measures and presription for flagyl. Patient verbalized understanding of when to return to MAU (abdominla pain that gets worse, bleeding, loss of fluid). Discussed importance of eating small regular meals with  high protein and low fat.  Charlesetta GaribaldiKathryn Lorraine Jenean Escandon CNM 10/21/2016, 8:37 PM

## 2016-10-26 LAB — GC/CHLAMYDIA PROBE AMP (~~LOC~~) NOT AT ARMC
Chlamydia: NEGATIVE
NEISSERIA GONORRHEA: NEGATIVE

## 2016-11-02 ENCOUNTER — Ambulatory Visit (INDEPENDENT_AMBULATORY_CARE_PROVIDER_SITE_OTHER): Payer: Medicaid Other | Admitting: Obstetrics & Gynecology

## 2016-11-02 VITALS — BP 112/72 | HR 76 | Wt 147.8 lb

## 2016-11-02 DIAGNOSIS — Z3400 Encounter for supervision of normal first pregnancy, unspecified trimester: Secondary | ICD-10-CM

## 2016-11-02 DIAGNOSIS — Z3402 Encounter for supervision of normal first pregnancy, second trimester: Secondary | ICD-10-CM

## 2016-11-02 NOTE — Progress Notes (Signed)
   PRENATAL VISIT NOTE  Subjective:  Adriana [name] is a 20 y.o. G1P0 at 1650w4d being seen today for ongoing prenatal care.  She is currently monitored for the following issues for this low-risk pregnancy and has Migraine headache with aura; Chlamydia infection affecting pregnancy in first trimester, antepartum; Supervision of normal first pregnancy, antepartum; and Susceptible to varicella (non-immune), currently pregnant on her problem list.  Patient reports sharp pains RQ at night.  Contractions: Not present. Vag. Bleeding: None.  Movement: Present. Denies leaking of fluid.   The following portions of the patient's history were reviewed and updated as appropriate: allergies, current medications, past family history, past medical history, past social history, past surgical history and problem list. Problem list updated.  Objective:   Vitals:   11/02/16 0902  BP: 112/72  Pulse: 76  Weight: 147 lb 12.8 oz (67 kg)    Fetal Status: Fetal Heart Rate (bpm): 157   Movement: Present     General:  Alert, oriented and cooperative. Patient is in no acute distress.  Skin: Skin is warm and dry. No rash noted.   Cardiovascular: Normal heart rate noted  Respiratory: Normal respiratory effort, no problems with respiration noted  Abdomen: Soft, gravid, appropriate for gestational age. Pain/Pressure: Absent     Pelvic:  Cervical exam deferred        Extremities: Normal range of motion.  Edema: None  Mental Status: Normal mood and affect. Normal behavior. Normal judgment and thought content.   Assessment and Plan:  Pregnancy: G1P0 at 7450w4d  1. Supervision of normal first pregnancy, antepartum abd NT, c/w Rd ligament pain - US MFM OB FOLLOW UP; Future  Preterm labor symptoms and general obstetric precautions including but not limited to vaginal bleeding, contractions, leaking of fluid and fetal movement were reviewed in detail with the patient. Please refer to After Visit Summary for other  counseling recommendations.  Return in about 4 weeks (around 11/30/2016) for 2 hr GTT.   Adam PhenixJames G Monserath Neff, MD

## 2016-11-02 NOTE — Progress Notes (Signed)
Pain on R side- sharp- bad at night

## 2016-11-11 ENCOUNTER — Other Ambulatory Visit: Payer: Self-pay | Admitting: Obstetrics & Gynecology

## 2016-11-11 ENCOUNTER — Ambulatory Visit (HOSPITAL_COMMUNITY)
Admission: RE | Admit: 2016-11-11 | Discharge: 2016-11-11 | Disposition: A | Discharge status: Home / Self Care | Payer: Medicaid Other | Source: Ambulatory Visit | Attending: Obstetrics & Gynecology | Admitting: Obstetrics & Gynecology

## 2016-11-11 DIAGNOSIS — Z3A23 23 weeks gestation of pregnancy: Secondary | ICD-10-CM

## 2016-11-11 DIAGNOSIS — Z3400 Encounter for supervision of normal first pregnancy, unspecified trimester: Secondary | ICD-10-CM

## 2016-11-11 DIAGNOSIS — Z362 Encounter for other antenatal screening follow-up: Secondary | ICD-10-CM

## 2016-11-11 DIAGNOSIS — Z3402 Encounter for supervision of normal first pregnancy, second trimester: Secondary | ICD-10-CM | POA: Diagnosis not present

## 2016-11-14 ENCOUNTER — Telehealth: Payer: Self-pay

## 2016-11-14 DIAGNOSIS — B379 Candidiasis, unspecified: Secondary | ICD-10-CM

## 2016-11-14 MED ORDER — TERCONAZOLE 0.8 % VA CREA: 1.0000 | TOPICAL_CREAM | Freq: Every day | VAGINAL | 0 refills | Status: DC

## 2016-11-14 NOTE — Telephone Encounter (Signed)
Patient called in wanting rx for yeast infection, sent rx per provider approval.

## 2016-11-28 NOTE — L&D Delivery Note (Signed)
Vaginal Delivery Note  21 y.o. G1P0 at 5572w5d delivered a viable female infant at 00:53 in cephalic, ROA position. No nuchal cord but with single true knot. Left anterior shoulder delivered with ease. Sixty sec delayed cord clamping. Cord clamped x2 and cut. Placenta delivered spontaneously intact, with 3VC. Fundus firm on exam with massage and pitocin.  Anesthesia: Epidural Laceration: None Suture repair: N/A EBL: 50 mL  Baby: Apgars: 8, 9 Weight: Pending Cord pH: Not sent  Good hemostasis noted. Mom to postpartum.  Baby to Couplet care / Skin to Skin.  Wendee Beaversavid J McMullen, DO, PGY-1 02/09/2017, 1:11 AM   Patient is a G1 at 3772w5d who was admitted w/ PPROM, significant hx of 1st tri chlamydia, hx migranes, but otherwise uncomplicated prenatal course.  She progressed with augmentation via cytotec and Pit.  I was gloved and present for delivery in its entirety.  Second stage of labor progressed to SVD.  Mild decels during second stage noted.  Complications: none  Lacerations: none  EBL: 50cc  SHAW, KIMBERLY, CNM 2:26 AM  02/09/2017

## 2016-11-30 ENCOUNTER — Other Ambulatory Visit: Payer: Medicaid Other

## 2016-11-30 ENCOUNTER — Ambulatory Visit (INDEPENDENT_AMBULATORY_CARE_PROVIDER_SITE_OTHER): Payer: Medicaid Other | Admitting: Obstetrics and Gynecology

## 2016-11-30 VITALS — BP 103/72 | HR 88 | Wt 160.0 lb

## 2016-11-30 DIAGNOSIS — A749 Chlamydial infection, unspecified: Secondary | ICD-10-CM

## 2016-11-30 DIAGNOSIS — Z2839 Other underimmunization status: Secondary | ICD-10-CM

## 2016-11-30 DIAGNOSIS — Z3400 Encounter for supervision of normal first pregnancy, unspecified trimester: Secondary | ICD-10-CM

## 2016-11-30 DIAGNOSIS — O09899 Supervision of other high risk pregnancies, unspecified trimester: Secondary | ICD-10-CM

## 2016-11-30 DIAGNOSIS — Z283 Underimmunization status: Secondary | ICD-10-CM

## 2016-11-30 DIAGNOSIS — O98311 Other infections with a predominantly sexual mode of transmission complicating pregnancy, first trimester: Secondary | ICD-10-CM

## 2016-11-30 DIAGNOSIS — O98811 Other maternal infectious and parasitic diseases complicating pregnancy, first trimester: Principal | ICD-10-CM

## 2016-11-30 MED ORDER — TETANUS-DIPHTH-ACELL PERTUSSIS 5-2.5-18.5 LF-MCG/0.5 IM SUSP
0.5000 mL | Freq: Once | INTRAMUSCULAR | Status: AC
  Administered 2016-11-30: 0.5 mL via INTRAMUSCULAR

## 2016-11-30 NOTE — Patient Instructions (Signed)
Third Trimester of Pregnancy The third trimester is from week 29 through week 40 (months 7 through 9). The third trimester is a time when the unborn baby (fetus) is growing rapidly. At the end of the ninth month, the fetus is about 20 inches in length and weighs 6-10 pounds. Body changes during your third trimester Your body goes through many changes during pregnancy. The changes vary from woman to woman. During the third trimester:  Your weight will continue to increase. You can expect to gain 25-35 pounds (11-16 kg) by the end of the pregnancy.  You may begin to get stretch marks on your hips, abdomen, and breasts.  You may urinate more often because the fetus is moving lower into your pelvis and pressing on your bladder.  You may develop or continue to have heartburn. This is caused by increased hormones that slow down muscles in the digestive tract.  You may develop or continue to have constipation because increased hormones slow digestion and cause the muscles that push waste through your intestines to relax.  You may develop hemorrhoids. These are swollen veins (varicose veins) in the rectum that can itch or be painful.  You may develop swollen, bulging veins (varicose veins) in your legs.  You may have increased body aches in the pelvis, back, or thighs. This is due to weight gain and increased hormones that are relaxing your joints.  You may have changes in your hair. These can include thickening of your hair, rapid growth, and changes in texture. Some women also have hair loss during or after pregnancy, or hair that feels dry or thin. Your hair will most likely return to normal after your baby is born.  Your breasts will continue to grow and they will continue to become tender. A yellow fluid (colostrum) may leak from your breasts. This is the first milk you are producing for your baby.  Your belly button may stick out.  You may notice more swelling in your hands, face, or  ankles.  You may have increased tingling or numbness in your hands, arms, and legs. The skin on your belly may also feel numb.  You may feel short of breath because of your expanding uterus.  You may have more problems sleeping. This can be caused by the size of your belly, increased need to urinate, and an increase in your body's metabolism.  You may notice the fetus "dropping," or moving lower in your abdomen.  You may have increased vaginal discharge.  Your cervix becomes thin and soft (effaced) near your due date. What to expect at prenatal visits You will have prenatal exams every 2 weeks until week 36. Then you will have weekly prenatal exams. During a routine prenatal visit:  You will be weighed to make sure you and the fetus are growing normally.  Your blood pressure will be taken.  Your abdomen will be measured to track your baby's growth.  The fetal heartbeat will be listened to.  Any test results from the previous visit will be discussed.  You may have a cervical check near your due date to see if you have effaced. At around 36 weeks, your health care provider will check your cervix. At the same time, your health care provider will also perform a test on the secretions of the vaginal tissue. This test is to determine if a type of bacteria, Group B streptococcus, is present. Your health care provider will explain this further. Your health care provider may ask you:    What your birth plan is.  How you are feeling.  If you are feeling the baby move.  If you have had any abnormal symptoms, such as leaking fluid, bleeding, severe headaches, or abdominal cramping.  If you are using any tobacco products, including cigarettes, chewing tobacco, and electronic cigarettes.  If you have any questions. Other tests or screenings that may be performed during your third trimester include:  Blood tests that check for low iron levels (anemia).  Fetal testing to check the health,  activity level, and growth of the fetus. Testing is done if you have certain medical conditions or if there are problems during the pregnancy.  Nonstress test (NST). This test checks the health of your baby to make sure there are no signs of problems, such as the baby not getting enough oxygen. During this test, a belt is placed around your belly. The baby is made to move, and its heart rate is monitored during movement. What is false labor? False labor is a condition in which you feel small, irregular tightenings of the muscles in the womb (contractions) that eventually go away. These are called Braxton Hicks contractions. Contractions may last for hours, days, or even weeks before true labor sets in. If contractions come at regular intervals, become more frequent, increase in intensity, or become painful, you should see your health care provider. What are the signs of labor?  Abdominal cramps.  Regular contractions that start at 10 minutes apart and become stronger and more frequent with time.  Contractions that start on the top of the uterus and spread down to the lower abdomen and back.  Increased pelvic pressure and dull back pain.  A watery or bloody mucus discharge that comes from the vagina.  Leaking of amniotic fluid. This is also known as your "water breaking." It could be a slow trickle or a gush. Let your doctor know if it has a color or strange odor. If you have any of these signs, call your health care provider right away, even if it is before your due date. Follow these instructions at home: Eating and drinking  Continue to eat regular, healthy meals.  Do not eat:  Raw meat or meat spreads.  Unpasteurized milk or cheese.  Unpasteurized juice.  Store-made salad.  Refrigerated smoked seafood.  Hot dogs or deli meat, unless they are piping hot.  More than 6 ounces of albacore tuna a week.  Shark, swordfish, king mackerel, or tile fish.  Store-made salads.  Raw  sprouts, such as mung bean or alfalfa sprouts.  Take prenatal vitamins as told by your health care provider.  Take 1000 mg of calcium daily as told by your health care provider.  If you develop constipation:  Take over-the-counter or prescription medicines.  Drink enough fluid to keep your urine clear or pale yellow.  Eat foods that are high in fiber, such as fresh fruits and vegetables, whole grains, and beans.  Limit foods that are high in fat and processed sugars, such as fried and sweet foods. Activity  Exercise only as directed by your health care provider. Healthy pregnant women should aim for 2 hours and 30 minutes of moderate exercise per week. If you experience any pain or discomfort while exercising, stop.  Avoid heavy lifting.  Do not exercise in extreme heat or humidity, or at high altitudes.  Wear low-heel, comfortable shoes.  Practice good posture.  Do not travel far distances unless it is absolutely necessary and only with the approval   of your health care provider.  Wear your seat belt at all times while in a car, on a bus, or on a plane.  Take frequent breaks and rest with your legs elevated if you have leg cramps or low back pain.  Do not use hot tubs, steam rooms, or saunas.  You may continue to have sex unless your health care provider tells you otherwise. Lifestyle  Do not use any products that contain nicotine or tobacco, such as cigarettes and e-cigarettes. If you need help quitting, ask your health care provider.  Do not drink alcohol.  Do not use any medicinal herbs or unprescribed drugs. These chemicals affect the formation and growth of the baby.  If you develop varicose veins:  Wear support pantyhose or compression stockings as told by your healthcare provider.  Elevate your feet for 15 minutes, 3-4 times a day.  Wear a supportive maternity bra to help with breast tenderness. General instructions  Take over-the-counter and prescription  medicines only as told by your health care provider. There are medicines that are either safe or unsafe to take during pregnancy.  Take warm sitz baths to soothe any pain or discomfort caused by hemorrhoids. Use hemorrhoid cream or witch hazel if your health care provider approves.  Avoid cat litter boxes and soil used by cats. These carry germs that can cause birth defects in the baby. If you have a cat, ask someone to clean the litter box for you.  To prepare for the arrival of your baby:  Take prenatal classes to understand, practice, and ask questions about the labor and delivery.  Make a trial run to the hospital.  Visit the hospital and tour the maternity area.  Arrange for maternity or paternity leave through employers.  Arrange for family and friends to take care of pets while you are in the hospital.  Purchase a rear-facing car seat and make sure you know how to install it in your car.  Pack your hospital bag.  Prepare the baby's nursery. Make sure to remove all pillows and stuffed animals from the baby's crib to prevent suffocation.  Visit your dentist if you have not gone during your pregnancy. Use a soft toothbrush to brush your teeth and be gentle when you floss.  Keep all prenatal follow-up visits as told by your health care provider. This is important. Contact a health care provider if:  You are unsure if you are in labor or if your water has broken.  You become dizzy.  You have mild pelvic cramps, pelvic pressure, or nagging pain in your abdominal area.  You have lower back pain.  You have persistent nausea, vomiting, or diarrhea.  You have an unusual or bad smelling vaginal discharge.  You have pain when you urinate. Get help right away if:  You have a fever.  You are leaking fluid from your vagina.  You have spotting or bleeding from your vagina.  You have severe abdominal pain or cramping.  You have rapid weight loss or weight gain.  You have  shortness of breath with chest pain.  You notice sudden or extreme swelling of your face, hands, ankles, feet, or legs.  Your baby makes fewer than 10 movements in 2 hours.  You have severe headaches that do not go away with medicine.  You have vision changes. Summary  The third trimester is from week 29 through week 40, months 7 through 9. The third trimester is a time when the unborn baby (fetus)   is growing rapidly.  During the third trimester, your discomfort may increase as you and your baby continue to gain weight. You may have abdominal, leg, and back pain, sleeping problems, and an increased need to urinate.  During the third trimester your breasts will keep growing and they will continue to become tender. A yellow fluid (colostrum) may leak from your breasts. This is the first milk you are producing for your baby.  False labor is a condition in which you feel small, irregular tightenings of the muscles in the womb (contractions) that eventually go away. These are called Braxton Hicks contractions. Contractions may last for hours, days, or even weeks before true labor sets in.  Signs of labor can include: abdominal cramps; regular contractions that start at 10 minutes apart and become stronger and more frequent with time; watery or bloody mucus discharge that comes from the vagina; increased pelvic pressure and dull back pain; and leaking of amniotic fluid. This information is not intended to replace advice given to you by your health care provider. Make sure you discuss any questions you have with your health care provider. Document Released: 11/08/2001 Document Revised: 04/21/2016 Document Reviewed: 01/15/2013 [employer] Interactive Patient Education  2017 [employer]  

## 2016-11-30 NOTE — Progress Notes (Signed)
   PRENATAL VISIT NOTE  Subjective:  Adriana [name] is a 21 y.o. G1P0 at 2185w4d being seen today for ongoing prenatal care.  She is currently monitored for the following issues for this low-risk pregnancy and has Migraine headache with aura; Chlamydia infection affecting pregnancy in first trimester, antepartum; Supervision of normal first pregnancy, antepartum; and Susceptible to varicella (non-immune), currently pregnant on her problem list.  Patient reports no complaints.  Contractions: Not present. Vag. Bleeding: None.  Movement: Present. Denies leaking of fluid.   The following portions of the patient's history were reviewed and updated as appropriate: allergies, current medications, past family history, past medical history, past social history, past surgical history and problem list. Problem list updated.  Objective:   Vitals:   11/30/16 0816  BP: 103/72  Pulse: 88  Weight: 160 lb (72.6 kg)    Fetal Status: Fetal Heart Rate (bpm): 152 Fundal Height: 26 cm Movement: Present     General:  Alert, oriented and cooperative. Patient is in no acute distress.  Skin: Skin is warm and dry. No rash noted.   Cardiovascular: Normal heart rate noted  Respiratory: Normal respiratory effort, no problems with respiration noted  Abdomen: Soft, gravid, appropriate for gestational age. Pain/Pressure: Absent     Pelvic:  Cervical exam deferred        Extremities: Normal range of motion.  Edema: None  Mental Status: Normal mood and affect. Normal behavior. Normal judgment and thought content.   Assessment and Plan:  Pregnancy: G1P0 at 7085w4d  1. Supervision of normal first pregnancy, antepartum Patient is doing well without complaints Third trimester labs and glucola today Patient desires Tdap - CBC - RPR - HIV antibody - Glucose Tolerance, 2 Hours w/1 Hour  2. Chlamydia infection affecting pregnancy in first trimester, antepartum TOC negative  3. Susceptible to varicella (non-immune),  currently pregnant Will offer pp  Preterm labor symptoms and general obstetric precautions including but not limited to vaginal bleeding, contractions, leaking of fluid and fetal movement were reviewed in detail with the patient. Please refer to After Visit Summary for other counseling recommendations.  Return in about 2 weeks (around 12/14/2016).   Catalina AntiguaPeggy Charnice Zwilling, MD

## 2016-11-30 NOTE — Addendum Note (Signed)
Addended by: Natale MilchSTALLING, Elisabeth Strom D on: 11/30/2016 09:08 AM   Modules accepted: Orders

## 2016-12-01 LAB — CBC
Hematocrit: 31.1 % — ABNORMAL LOW (ref 34.0–46.6)
Hemoglobin: 10.3 g/dL — ABNORMAL LOW (ref 11.1–15.9)
MCH: 34.2 pg — ABNORMAL HIGH (ref 26.6–33.0)
MCHC: 33.1 g/dL (ref 31.5–35.7)
MCV: 103 fL — ABNORMAL HIGH (ref 79–97)
PLATELETS: 270 10*3/uL (ref 150–379)
RBC: 3.01 x10E6/uL — ABNORMAL LOW (ref 3.77–5.28)
RDW: 13.8 % (ref 12.3–15.4)
WBC: 7.6 10*3/uL (ref 3.4–10.8)

## 2016-12-01 LAB — GLUCOSE TOLERANCE, 2 HOURS W/ 1HR
GLUCOSE, 2 HOUR: 91 mg/dL (ref 65–152)
Glucose, 1 hour: 80 mg/dL (ref 65–179)
Glucose, Fasting: 73 mg/dL (ref 65–91)

## 2016-12-01 LAB — RPR: RPR: NONREACTIVE

## 2016-12-01 LAB — HIV ANTIBODY (ROUTINE TESTING W REFLEX): HIV SCREEN 4TH GENERATION: NONREACTIVE

## 2016-12-15 ENCOUNTER — Encounter: Payer: Medicaid Other | Admitting: Family Medicine

## 2016-12-20 ENCOUNTER — Ambulatory Visit (INDEPENDENT_AMBULATORY_CARE_PROVIDER_SITE_OTHER): Payer: Medicaid Other | Admitting: Certified Nurse Midwife

## 2016-12-20 ENCOUNTER — Other Ambulatory Visit (HOSPITAL_COMMUNITY)
Admission: RE | Admit: 2016-12-20 | Discharge: 2016-12-20 | Disposition: A | Discharge status: Home / Self Care | Payer: Medicaid Other | Source: Ambulatory Visit | Attending: Certified Nurse Midwife | Admitting: Certified Nurse Midwife

## 2016-12-20 VITALS — BP 114/67 | HR 94 | Temp 98.4°F | Wt 168.2 lb

## 2016-12-20 DIAGNOSIS — Z3A29 29 weeks gestation of pregnancy: Secondary | ICD-10-CM | POA: Insufficient documentation

## 2016-12-20 DIAGNOSIS — Z3403 Encounter for supervision of normal first pregnancy, third trimester: Secondary | ICD-10-CM

## 2016-12-20 DIAGNOSIS — N898 Other specified noninflammatory disorders of vagina: Secondary | ICD-10-CM | POA: Insufficient documentation

## 2016-12-20 DIAGNOSIS — O09899 Supervision of other high risk pregnancies, unspecified trimester: Secondary | ICD-10-CM

## 2016-12-20 DIAGNOSIS — O26893 Other specified pregnancy related conditions, third trimester: Secondary | ICD-10-CM | POA: Insufficient documentation

## 2016-12-20 DIAGNOSIS — Z3400 Encounter for supervision of normal first pregnancy, unspecified trimester: Secondary | ICD-10-CM

## 2016-12-20 DIAGNOSIS — Z283 Underimmunization status: Secondary | ICD-10-CM

## 2016-12-20 NOTE — Patient Instructions (Addendum)
Third Trimester of Pregnancy The third trimester is from week 29 through week 40 (months 7 through 9). The third trimester is a time when the unborn baby (fetus) is growing rapidly. At the end of the ninth month, the fetus is about 20 inches in length and weighs 6-10 pounds. Body changes during your third trimester Your body goes through many changes during pregnancy. The changes vary from woman to woman. During the third trimester:  Your weight will continue to increase. You can expect to gain 25-35 pounds (11-16 kg) by the end of the pregnancy.  You may begin to get stretch marks on your hips, abdomen, and breasts.  You may urinate more often because the fetus is moving lower into your pelvis and pressing on your bladder.  You may develop or continue to have heartburn. This is caused by increased hormones that slow down muscles in the digestive tract.  You may develop or continue to have constipation because increased hormones slow digestion and cause the muscles that push waste through your intestines to relax.  You may develop hemorrhoids. These are swollen veins (varicose veins) in the rectum that can itch or be painful.  You may develop swollen, bulging veins (varicose veins) in your legs.  You may have increased body aches in the pelvis, back, or thighs. This is due to weight gain and increased hormones that are relaxing your joints.  You may have changes in your hair. These can include thickening of your hair, rapid growth, and changes in texture. Some women also have hair loss during or after pregnancy, or hair that feels dry or thin. Your hair will most likely return to normal after your baby is born.  Your breasts will continue to grow and they will continue to become tender. A yellow fluid (colostrum) may leak from your breasts. This is the first milk you are producing for your baby.  Your belly button may stick out.  You may notice more swelling in your hands, face, or  ankles.  You may have increased tingling or numbness in your hands, arms, and legs. The skin on your belly may also feel numb.  You may feel short of breath because of your expanding uterus.  You may have more problems sleeping. This can be caused by the size of your belly, increased need to urinate, and an increase in your body's metabolism.  You may notice the fetus "dropping," or moving lower in your abdomen.  You may have increased vaginal discharge.  Your cervix becomes thin and soft (effaced) near your due date. What to expect at prenatal visits You will have prenatal exams every 2 weeks until week 36. Then you will have weekly prenatal exams. During a routine prenatal visit:  You will be weighed to make sure you and the fetus are growing normally.  Your blood pressure will be taken.  Your abdomen will be measured to track your baby's growth.  The fetal heartbeat will be listened to.  Any test results from the previous visit will be discussed.  You may have a cervical check near your due date to see if you have effaced. At around 36 weeks, your health care provider will check your cervix. At the same time, your health care provider will also perform a test on the secretions of the vaginal tissue. This test is to determine if a type of bacteria, Group B streptococcus, is present. Your health care provider will explain this further. Your health care provider may ask you:    What your birth plan is.  How you are feeling.  If you are feeling the baby move.  If you have had any abnormal symptoms, such as leaking fluid, bleeding, severe headaches, or abdominal cramping.  If you are using any tobacco products, including cigarettes, chewing tobacco, and electronic cigarettes.  If you have any questions. Other tests or screenings that may be performed during your third trimester include:  Blood tests that check for low iron levels (anemia).  Fetal testing to check the health,  activity level, and growth of the fetus. Testing is done if you have certain medical conditions or if there are problems during the pregnancy.  Nonstress test (NST). This test checks the health of your baby to make sure there are no signs of problems, such as the baby not getting enough oxygen. During this test, a belt is placed around your belly. The baby is made to move, and its heart rate is monitored during movement. What is false labor? False labor is a condition in which you feel small, irregular tightenings of the muscles in the womb (contractions) that eventually go away. These are called Braxton Hicks contractions. Contractions may last for hours, days, or even weeks before true labor sets in. If contractions come at regular intervals, become more frequent, increase in intensity, or become painful, you should see your health care provider. What are the signs of labor?  Abdominal cramps.  Regular contractions that start at 10 minutes apart and become stronger and more frequent with time.  Contractions that start on the top of the uterus and spread down to the lower abdomen and back.  Increased pelvic pressure and dull back pain.  A watery or bloody mucus discharge that comes from the vagina.  Leaking of amniotic fluid. This is also known as your "water breaking." It could be a slow trickle or a gush. Let your doctor know if it has a color or strange odor. If you have any of these signs, call your health care provider right away, even if it is before your due date. Follow these instructions at home: Eating and drinking  Continue to eat regular, healthy meals.  Do not eat:  Raw meat or meat spreads.  Unpasteurized milk or cheese.  Unpasteurized juice.  Store-made salad.  Refrigerated smoked seafood.  Hot dogs or deli meat, unless they are piping hot.  More than 6 ounces of albacore tuna a week.  Shark, swordfish, king mackerel, or tile fish.  Store-made salads.  Raw  sprouts, such as mung bean or alfalfa sprouts.  Take prenatal vitamins as told by your health care provider.  Take 1000 mg of calcium daily as told by your health care provider.  If you develop constipation:  Take over-the-counter or prescription medicines.  Drink enough fluid to keep your urine clear or pale yellow.  Eat foods that are high in fiber, such as fresh fruits and vegetables, whole grains, and beans.  Limit foods that are high in fat and processed sugars, such as fried and sweet foods. Activity  Exercise only as directed by your health care provider. Healthy pregnant women should aim for 2 hours and 30 minutes of moderate exercise per week. If you experience any pain or discomfort while exercising, stop.  Avoid heavy lifting.  Do not exercise in extreme heat or humidity, or at high altitudes.  Wear low-heel, comfortable shoes.  Practice good posture.  Do not travel far distances unless it is absolutely necessary and only with the approval   of your health care provider.  Wear your seat belt at all times while in a car, on a bus, or on a plane.  Take frequent breaks and rest with your legs elevated if you have leg cramps or low back pain.  Do not use hot tubs, steam rooms, or saunas.  You may continue to have sex unless your health care provider tells you otherwise. Lifestyle  Do not use any products that contain nicotine or tobacco, such as cigarettes and e-cigarettes. If you need help quitting, ask your health care provider.  Do not drink alcohol.  Do not use any medicinal herbs or unprescribed drugs. These chemicals affect the formation and growth of the baby.  If you develop varicose veins:  Wear support pantyhose or compression stockings as told by your healthcare provider.  Elevate your feet for 15 minutes, 3-4 times a day.  Wear a supportive maternity bra to help with breast tenderness. General instructions  Take over-the-counter and prescription  medicines only as told by your health care provider. There are medicines that are either safe or unsafe to take during pregnancy.  Take warm sitz baths to soothe any pain or discomfort caused by hemorrhoids. Use hemorrhoid cream or witch hazel if your health care provider approves.  Avoid cat litter boxes and soil used by cats. These carry germs that can cause birth defects in the baby. If you have a cat, ask someone to clean the litter box for you.  To prepare for the arrival of your baby:  Take prenatal classes to understand, practice, and ask questions about the labor and delivery.  Make a trial run to the hospital.  Visit the hospital and tour the maternity area.  Arrange for maternity or paternity leave through employers.  Arrange for family and friends to take care of pets while you are in the hospital.  Purchase a rear-facing car seat and make sure you know how to install it in your car.  Pack your hospital bag.  Prepare the baby's nursery. Make sure to remove all pillows and stuffed animals from the baby's crib to prevent suffocation.  Visit your dentist if you have not gone during your pregnancy. Use a soft toothbrush to brush your teeth and be gentle when you floss.  Keep all prenatal follow-up visits as told by your health care provider. This is important. Contact a health care provider if:  You are unsure if you are in labor or if your water has broken.  You become dizzy.  You have mild pelvic cramps, pelvic pressure, or nagging pain in your abdominal area.  You have lower back pain.  You have persistent nausea, vomiting, or diarrhea.  You have an unusual or bad smelling vaginal discharge.  You have pain when you urinate. Get help right away if:  You have a fever.  You are leaking fluid from your vagina.  You have spotting or bleeding from your vagina.  You have severe abdominal pain or cramping.  You have rapid weight loss or weight gain.  You have  shortness of breath with chest pain.  You notice sudden or extreme swelling of your face, hands, ankles, feet, or legs.  Your baby makes fewer than 10 movements in 2 hours.  You have severe headaches that do not go away with medicine.  You have vision changes. Summary  The third trimester is from week 29 through week 40, months 7 through 9. The third trimester is a time when the unborn baby (fetus)   is growing rapidly.  During the third trimester, your discomfort may increase as you and your baby continue to gain weight. You may have abdominal, leg, and back pain, sleeping problems, and an increased need to urinate.  During the third trimester your breasts will keep growing and they will continue to become tender. A yellow fluid (colostrum) may leak from your breasts. This is the first milk you are producing for your baby.  False labor is a condition in which you feel small, irregular tightenings of the muscles in the womb (contractions) that eventually go away. These are called Braxton Hicks contractions. Contractions may last for hours, days, or even weeks before true labor sets in.  Signs of labor can include: abdominal cramps; regular contractions that start at 10 minutes apart and become stronger and more frequent with time; watery or bloody mucus discharge that comes from the vagina; increased pelvic pressure and dull back pain; and leaking of amniotic fluid. This information is not intended to replace advice given to you by your health care provider. Make sure you discuss any questions you have with your health care provider. Document Released: 11/08/2001 Document Revised: 04/21/2016 Document Reviewed: 01/15/2013 Elsevier Interactive Patient Education  2017 [employer]  

## 2016-12-20 NOTE — Progress Notes (Signed)
Patient c/o vaginal discharge for about a week.

## 2016-12-20 NOTE — Progress Notes (Signed)
   PRENATAL VISIT NOTE  Subjective:  Adriana [name] is a 21 y.o. G1P0 at 3174w3d being seen today for ongoing prenatal care.  She is currently monitored for the following issues for this low-risk pregnancy and has Migraine headache with aura; Chlamydia infection affecting pregnancy in first trimester, antepartum; Supervision of normal first pregnancy, antepartum; and Susceptible to varicella (non-immune), currently pregnant on her problem list.  Patient reports no bleeding, no contractions, no cramping, no leaking and vaginal discharge X1 week, denies itching, odor, or color to the discharge, hx of Chlamydia earlier in pregnancy.  Contractions: Not present. Vag. Bleeding: None.  Movement: Absent. Denies leaking of fluid. [name] present for exam.   The following portions of the patient's history were reviewed and updated as appropriate: allergies, current medications, past family history, past medical history, past social history, past surgical history and problem list. Problem list updated.  Objective:   Vitals:   12/20/16 0826  BP: 114/67  Pulse: 94  Temp: 98.4 F (36.9 C)  Weight: 168 lb 3.2 oz (76.3 kg)    Fetal Status: Fetal Heart Rate (bpm): 138 Fundal Height: 29 cm Movement: Absent     General:  Alert, oriented and cooperative. Patient is in no acute distress.  Skin: Skin is warm and dry. No rash noted.   Cardiovascular: Normal heart rate noted  Respiratory: Normal respiratory effort, no problems with respiration noted  Abdomen: Soft, gravid, appropriate for gestational age. Pain/Pressure: Present     Pelvic:  Cervical exam deferred        Extremities: Normal range of motion.  Edema: Trace  Mental Status: Normal mood and affect. Normal behavior. Normal judgment and thought content.   Assessment and Plan:  Pregnancy: G1P0 at 7174w3d  1. Supervision of normal first pregnancy, antepartum      Doing well.   2. Susceptible to varicella (non-immune), currently pregnant     Varicella  PP  3. Vaginal discharge during pregnancy in third trimester - Cervicovaginal ancillary only  Preterm labor symptoms and general obstetric precautions including but not limited to vaginal bleeding, contractions, leaking of fluid and fetal movement were reviewed in detail with the patient. Please refer to After Visit Summary for other counseling recommendations.  Return in about 2 weeks (around 01/03/2017) for ROB.   Roe Coombsachelle A Venson Ferencz, CNM

## 2016-12-21 LAB — CERVICOVAGINAL ANCILLARY ONLY
BACTERIAL VAGINITIS: NEGATIVE
Candida vaginitis: NEGATIVE
Chlamydia: NEGATIVE
NEISSERIA GONORRHEA: NEGATIVE
TRICH (WINDOWPATH): NEGATIVE

## 2017-01-04 ENCOUNTER — Ambulatory Visit (INDEPENDENT_AMBULATORY_CARE_PROVIDER_SITE_OTHER): Payer: Medicaid Other | Admitting: Certified Nurse Midwife

## 2017-01-04 VITALS — BP 112/77 | HR 85 | Wt 175.0 lb

## 2017-01-04 DIAGNOSIS — O98313 Other infections with a predominantly sexual mode of transmission complicating pregnancy, third trimester: Secondary | ICD-10-CM

## 2017-01-04 DIAGNOSIS — O99013 Anemia complicating pregnancy, third trimester: Secondary | ICD-10-CM

## 2017-01-04 DIAGNOSIS — D649 Anemia, unspecified: Secondary | ICD-10-CM

## 2017-01-04 DIAGNOSIS — Z3400 Encounter for supervision of normal first pregnancy, unspecified trimester: Secondary | ICD-10-CM

## 2017-01-04 DIAGNOSIS — A749 Chlamydial infection, unspecified: Secondary | ICD-10-CM

## 2017-01-04 DIAGNOSIS — G43101 Migraine with aura, not intractable, with status migrainosus: Secondary | ICD-10-CM

## 2017-01-04 DIAGNOSIS — O98811 Other maternal infectious and parasitic diseases complicating pregnancy, first trimester: Secondary | ICD-10-CM

## 2017-01-04 DIAGNOSIS — Z283 Underimmunization status: Secondary | ICD-10-CM

## 2017-01-04 DIAGNOSIS — O09899 Supervision of other high risk pregnancies, unspecified trimester: Secondary | ICD-10-CM

## 2017-01-04 NOTE — Progress Notes (Signed)
   PRENATAL VISIT NOTE  Subjective:  Adriana [name] is a 21 y.o. G1P0 at 7034w4d being seen today for ongoing prenatal care.  She is currently monitored for the following issues for this low-risk pregnancy and has Migraine headache with aura; Chlamydia infection affecting pregnancy in first trimester, antepartum; Supervision of normal first pregnancy, antepartum; Susceptible to varicella (non-immune), currently pregnant; and Anemia affecting pregnancy in third trimester on her problem list.  Patient reports no complaints.  Contractions: Not present. Vag. Bleeding: None.  Movement: Present. Denies leaking of fluid.   The following portions of the patient's history were reviewed and updated as appropriate: allergies, current medications, past family history, past medical history, past social history, past surgical history and problem list. Problem list updated.  Objective:   Vitals:   01/04/17 1006  BP: 112/77  Pulse: 85  Weight: 175 lb (79.4 kg)    Fetal Status: Fetal Heart Rate (bpm): 145 Fundal Height: 31 cm Movement: Present     General:  Alert, oriented and cooperative. Patient is in no acute distress.  Skin: Skin is warm and dry. No rash noted.   Cardiovascular: Normal heart rate noted  Respiratory: Normal respiratory effort, no problems with respiration noted  Abdomen: Soft, gravid, appropriate for gestational age. Pain/Pressure: Present     Pelvic:  Cervical exam deferred        Extremities: Normal range of motion.     Mental Status: Normal mood and affect. Normal behavior. Normal judgment and thought content.   Assessment and Plan:  Pregnancy: G1P0 at 5234w4d  1. Supervision of normal first pregnancy, antepartum     Doing well  2. Migraine with aura and with status migrainosus, not intractable      No complaints  3. Susceptible to varicella (non-immune), currently pregnant     Varicella postpartum  4. Chlamydia infection affecting pregnancy in first trimester,  antepartum     TOC negative  5. Anemia affecting pregnancy in third trimester     OTC iron supplementation with colace.   Preterm labor symptoms and general obstetric precautions including but not limited to vaginal bleeding, contractions, leaking of fluid and fetal movement were reviewed in detail with the patient. Please refer to After Visit Summary for other counseling recommendations.  Return in about 2 weeks (around 01/18/2017) for ROB.   Roe Coombsachelle A Denney, CNM

## 2017-01-18 ENCOUNTER — Encounter: Payer: Medicaid Other | Admitting: Certified Nurse Midwife

## 2017-01-25 ENCOUNTER — Ambulatory Visit (INDEPENDENT_AMBULATORY_CARE_PROVIDER_SITE_OTHER): Payer: Medicaid Other | Admitting: Certified Nurse Midwife

## 2017-01-25 VITALS — BP 112/75 | HR 93 | Wt 181.0 lb

## 2017-01-25 DIAGNOSIS — J029 Acute pharyngitis, unspecified: Secondary | ICD-10-CM

## 2017-01-25 DIAGNOSIS — O99013 Anemia complicating pregnancy, third trimester: Secondary | ICD-10-CM

## 2017-01-25 DIAGNOSIS — Z283 Underimmunization status: Secondary | ICD-10-CM

## 2017-01-25 DIAGNOSIS — D649 Anemia, unspecified: Secondary | ICD-10-CM

## 2017-01-25 DIAGNOSIS — Z3400 Encounter for supervision of normal first pregnancy, unspecified trimester: Secondary | ICD-10-CM

## 2017-01-25 DIAGNOSIS — O09899 Supervision of other high risk pregnancies, unspecified trimester: Secondary | ICD-10-CM

## 2017-01-25 DIAGNOSIS — Z2839 Other underimmunization status: Secondary | ICD-10-CM

## 2017-01-25 LAB — POCT RAPID STREP A (OFFICE): Rapid Strep A Screen: NEGATIVE

## 2017-01-25 MED ORDER — AMOXICILLIN-POT CLAVULANATE 875-125 MG PO TABS: 1.0000 | ORAL_TABLET | Freq: Two times a day (BID) | ORAL | 0 refills | Status: DC

## 2017-01-25 NOTE — Progress Notes (Signed)
   PRENATAL VISIT NOTE  Subjective:  Adriana [name] is a 21 y.o. G1P0 at 8551w4d being seen today for ongoing prenatal care.  She is currently monitored for the following issues for this low-risk pregnancy and has Migraine headache with aura; Chlamydia infection affecting pregnancy in first trimester, antepartum; Supervision of normal first pregnancy, antepartum; Susceptible to varicella (non-immune), currently pregnant; and Anemia affecting pregnancy in third trimester on her problem list.  Patient reports no bleeding, no contractions, no cramping, no leaking and sore throat, has had a hx of tonsil pustules\.  Contractions: Not present. Vag. Bleeding: None.  Movement: Present. Denies leaking of fluid.   The following portions of the patient's history were reviewed and updated as appropriate: allergies, current medications, past family history, past medical history, past social history, past surgical history and problem list. Problem list updated.  Objective:   Vitals:   01/25/17 0957  BP: 112/75  Pulse: 93  Weight: 181 lb (82.1 kg)    Fetal Status: Fetal Heart Rate (bpm): 130 Fundal Height: 33 cm Movement: Present     General:  Alert, oriented and cooperative. Patient is in no acute distress.  Skin: Skin is warm and dry. No rash noted.   Cardiovascular: Normal heart rate noted  Respiratory: Normal respiratory effort, no problems with respiration noted  Abdomen: Soft, gravid, appropriate for gestational age. Pain/Pressure: Absent     Pelvic:  Cervical exam deferred        Extremities: Normal range of motion.     Mental Status: Normal mood and affect. Normal behavior. Normal judgment and thought content.    Rapid strep test negative.  Assessment and Plan:  Pregnancy: G1P0 at 7151w4d  1. Susceptible to varicella (non-immune), currently pregnant     Varicella postpartum  2. Supervision of normal first pregnancy, antepartum        3. Anemia affecting pregnancy in third trimester  Taking OTC iron  4. Pharyngitis, unspecified etiology      Strep test - amoxicillin-clavulanate (AUGMENTIN) 875-125 MG tablet; Take 1 tablet by mouth 2 (two) times daily.  Dispense: 14 tablet; Refill: 0  Preterm labor symptoms and general obstetric precautions including but not limited to vaginal bleeding, contractions, leaking of fluid and fetal movement were reviewed in detail with the patient. Please refer to After Visit Summary for other counseling recommendations.  Return in about 1 week (around 02/01/2017) for ROB, GBS.   Roe Coombsachelle A Cristopher Ciccarelli, CNM

## 2017-01-27 ENCOUNTER — Inpatient Hospital Stay (HOSPITAL_COMMUNITY)
Admission: AD | Admit: 2017-01-27 | Discharge: 2017-01-27 | Disposition: A | Discharge status: Home / Self Care | Payer: Medicaid Other | Source: Ambulatory Visit | Attending: Obstetrics and Gynecology | Admitting: Obstetrics and Gynecology

## 2017-01-27 ENCOUNTER — Encounter (HOSPITAL_COMMUNITY): Payer: Self-pay

## 2017-01-27 DIAGNOSIS — O4703 False labor before 37 completed weeks of gestation, third trimester: Secondary | ICD-10-CM | POA: Insufficient documentation

## 2017-01-27 DIAGNOSIS — O98811 Other maternal infectious and parasitic diseases complicating pregnancy, first trimester: Secondary | ICD-10-CM

## 2017-01-27 DIAGNOSIS — Z3A34 34 weeks gestation of pregnancy: Secondary | ICD-10-CM | POA: Diagnosis not present

## 2017-01-27 DIAGNOSIS — O479 False labor, unspecified: Secondary | ICD-10-CM

## 2017-01-27 DIAGNOSIS — Z3400 Encounter for supervision of normal first pregnancy, unspecified trimester: Secondary | ICD-10-CM

## 2017-01-27 DIAGNOSIS — A749 Chlamydial infection, unspecified: Secondary | ICD-10-CM

## 2017-01-27 DIAGNOSIS — Z87891 Personal history of nicotine dependence: Secondary | ICD-10-CM | POA: Diagnosis not present

## 2017-01-27 DIAGNOSIS — R109 Unspecified abdominal pain: Secondary | ICD-10-CM | POA: Diagnosis present

## 2017-01-27 DIAGNOSIS — Z79899 Other long term (current) drug therapy: Secondary | ICD-10-CM | POA: Diagnosis not present

## 2017-01-27 DIAGNOSIS — Z2839 Other underimmunization status: Secondary | ICD-10-CM

## 2017-01-27 DIAGNOSIS — Z283 Underimmunization status: Secondary | ICD-10-CM

## 2017-01-27 DIAGNOSIS — O09899 Supervision of other high risk pregnancies, unspecified trimester: Secondary | ICD-10-CM

## 2017-01-27 LAB — WET PREP, GENITAL
Clue Cells Wet Prep HPF POC: NONE SEEN
Sperm: NONE SEEN
TRICH WET PREP: NONE SEEN
YEAST WET PREP: NONE SEEN

## 2017-01-27 LAB — URINALYSIS, ROUTINE W REFLEX MICROSCOPIC
BILIRUBIN URINE: NEGATIVE
Glucose, UA: 50 mg/dL — AB
Hgb urine dipstick: NEGATIVE
KETONES UR: NEGATIVE mg/dL
Leukocytes, UA: NEGATIVE
NITRITE: NEGATIVE
Protein, ur: NEGATIVE mg/dL
SPECIFIC GRAVITY, URINE: 1.011 (ref 1.005–1.030)
pH: 7 (ref 5.0–8.0)

## 2017-01-27 NOTE — MAU Provider Note (Signed)
  History     CSN: [id number]  Arrival date and time: 01/27/17 1351   None     Chief Complaint  Patient presents with  . Abdominal Pain   HPI 21 yo G1P0 at 2858w6d presenting today for the evaluation of left sided pain. Patient reports onset of the pain yesterday evening and it worsened over the course of the day. She describes the pain as [place] but worsens with immobilization. She states the pain originates on the side of her abdomen and radiates towards her pelvis as pressure. She denies any dysuria. She states that walking helps relieve her pain    Past Medical History:  Diagnosis Date  . Migraines     Past Surgical History:  Procedure Laterality Date  . HERNIA REPAIR      History reviewed. No pertinent family history.  Social History  Substance Use Topics  . Smoking status: Former Smoker    Quit date: 06/30/2016  . Smokeless tobacco: Never Used  . Alcohol use No    Allergies:  Allergies  Allergen Reactions  . Imitrex [Sumatriptan] Other (See Comments)    Chest pain, heaviness, migraine  . Latex Rash    Prescriptions Prior to Admission  Medication Sig Dispense Refill Last Dose  . amoxicillin-clavulanate (AUGMENTIN) 875-125 MG tablet Take 1 tablet by mouth 2 (two) times daily. 14 tablet 0 01/27/2017 at Unknown time  . IRON PO Take 1 tablet by mouth daily. OTC, pt is not sure of strength   Past Week at Unknown time  . Prenatal Vit-Fe Fumarate-FA (PRENATAL MULTIVITAMIN) TABS tablet Take 1 tablet by mouth daily at 12 noon.   01/26/2017 at Unknown time    Review of Systems  See pertinent in HPI Physical Exam    Physical Exam GENERAL: Well-developed, well-nourished female in no acute distress.  ABDOMEN: Soft, nontender, gravid PELVIC: Normal external female genitalia. Vagina is pink and rugated.  Normal discharge. Normal appearing cervix.  Cervix: closed, thick and long FHT: baseline 130, mod variability, +accels, no decels Toco: irregular  contractions EXTREMITIES: No cyanosis, clubbing, or edema, 2+ distal pulses.  MAU Course  Procedures  MDM UA- neg Wet prep- Neg  Assessment and Plan  21 yo G1P0 at 1958w6d with braxton hicks contractions - Reassurance provided - Preterm labor precautions reviewed - Follow up as scheduled for prenatal care on 3/8 - RTC prn  Lashae Wollenberg 01/27/2017, 3:21 PM

## 2017-01-27 NOTE — MAU Note (Addendum)
Pt has been feeling abdominal pain since last night. Constant pain on her left side and also cramping in lower abdomen. No bleeding or LOF.

## 2017-01-27 NOTE — MAU Note (Signed)
Urine in lab 

## 2017-01-27 NOTE — Discharge Instructions (Signed)

## 2017-01-30 ENCOUNTER — Inpatient Hospital Stay (HOSPITAL_COMMUNITY)
Admission: AD | Admit: 2017-01-30 | Discharge: 2017-01-30 | Disposition: A | Discharge status: Home / Self Care | Payer: Medicaid Other | Source: Ambulatory Visit | Attending: Family Medicine | Admitting: Family Medicine

## 2017-01-30 ENCOUNTER — Encounter (HOSPITAL_COMMUNITY): Payer: Self-pay | Admitting: *Deleted

## 2017-01-30 DIAGNOSIS — Z3A35 35 weeks gestation of pregnancy: Secondary | ICD-10-CM | POA: Diagnosis not present

## 2017-01-30 DIAGNOSIS — M545 Low back pain, unspecified: Secondary | ICD-10-CM

## 2017-01-30 DIAGNOSIS — Z87891 Personal history of nicotine dependence: Secondary | ICD-10-CM | POA: Insufficient documentation

## 2017-01-30 DIAGNOSIS — O26893 Other specified pregnancy related conditions, third trimester: Secondary | ICD-10-CM | POA: Diagnosis not present

## 2017-01-30 LAB — URINALYSIS, ROUTINE W REFLEX MICROSCOPIC
BILIRUBIN URINE: NEGATIVE
GLUCOSE, UA: NEGATIVE mg/dL
Hgb urine dipstick: NEGATIVE
KETONES UR: 5 mg/dL — AB
Leukocytes, UA: NEGATIVE
Nitrite: NEGATIVE
PH: 6 (ref 5.0–8.0)
Protein, ur: NEGATIVE mg/dL
SPECIFIC GRAVITY, URINE: 1.017 (ref 1.005–1.030)

## 2017-01-30 LAB — GC/CHLAMYDIA PROBE AMP (~~LOC~~) NOT AT ARMC
CHLAMYDIA, DNA PROBE: NEGATIVE
Neisseria Gonorrhea: NEGATIVE

## 2017-01-30 MED ORDER — CYCLOBENZAPRINE HCL 5 MG PO TABS: 5.0000 mg | ORAL_TABLET | Freq: Three times a day (TID) | ORAL | 0 refills | Status: DC | PRN

## 2017-01-30 MED ORDER — CYCLOBENZAPRINE HCL 5 MG PO TABS
5.0000 mg | ORAL_TABLET | Freq: Once | ORAL | Status: AC
  Administered 2017-01-30: 5 mg via ORAL
  Filled 2017-01-30: qty 1

## 2017-01-30 NOTE — MAU Provider Note (Signed)
Chief Complaint: Back Pain (Left sided)   First Provider Initiated Contact with Patient 01/30/17 2101     SUBJECTIVE HPI: Adriana [name] is a [age] y.o. G1P0 at 718w2d who presents to Maternity Admissions reporting left sided back pain of 4 days durations. Denies h/o kidney stones, change in urine color or polyuria, denies fevers, chills, nausea, vomiting, SOB.  Location: Left lumbar Quality: sharp Severity: 5/10 in pain scale Duration: constant Context: pregnant Timing: onset 4 days ago Modifying factors: none Associated signs and symptoms: none  Denies contractions, leakage of fluid or vaginal bleeding. Good fetal movement.   Pregnancy Course:   Past Medical History:  Diagnosis Date  . Migraines    OB History  Gravida Para Term Preterm AB Living  1            SAB TAB Ectopic Multiple Live Births               # Outcome Date GA Lbr Len/2nd Weight Sex Delivery Anes PTL Lv  1 Current              Past Surgical History:  Procedure Laterality Date  . HERNIA REPAIR     History reviewed. No pertinent family history. Social History  Substance Use Topics  . Smoking status: Former Smoker    Quit date: 06/30/2016  . Smokeless tobacco: Never Used  . Alcohol use No   Allergies  Allergen Reactions  . Imitrex [Sumatriptan] Other (See Comments)    Chest pain, heaviness, migraine  . Latex Rash   Prescriptions Prior to Admission  Medication Sig Dispense Refill Last Dose  . acetaminophen (TYLENOL) 500 MG chewable tablet Chew 1,000 mg by mouth every 6 (six) hours as needed for pain.   01/30/2017 at 1700  . IRON PO Take 1 tablet by mouth daily.    Past Week at Unknown time  . Prenatal Vit-Fe Fumarate-FA (PRENATAL MULTIVITAMIN) TABS tablet Take 1 tablet by mouth daily at 12 noon.   01/29/2017 at Unknown time  . amoxicillin-clavulanate (AUGMENTIN) 875-125 MG tablet Take 1 tablet by mouth 2 (two) times daily. (Patient not taking: Reported on 01/30/2017) 14 tablet 0 Not Taking at Unknown time     I have reviewed patient's Past Medical Hx, Surgical Hx, Family Hx, Social Hx, medications and allergies.   ROS:  Review of Systems  Constitutional: Negative for appetite change, chills, diaphoresis, fatigue and fever.  HENT: Negative for congestion, rhinorrhea, sinus pain and sore throat.   Respiratory: Negative for cough, shortness of breath and wheezing.   Cardiovascular: Negative for chest pain, palpitations and leg swelling.  Gastrointestinal: Negative for abdominal pain, constipation, diarrhea, nausea and vomiting.  Genitourinary: Negative for dysuria, frequency, hematuria, urgency, vaginal bleeding, vaginal discharge and vaginal pain.  Musculoskeletal: Positive for back pain. Negative for arthralgias, myalgias, neck pain and neck stiffness.  Neurological: Negative for weakness and headaches.    Physical Exam   Patient Vitals for the past 24 hrs:  BP Temp Temp src Pulse Resp Height Weight  01/30/17 2228 119/68 - - 80 16 - -  01/30/17 2039 118/74 98 F (36.7 C) Oral 94 16 5\' 6"  (1.676 m) 182 lb (82.6 kg)   Constitutional: Well-developed, well-nourished female in no acute distress.  Cardiovascular: normal rate Respiratory: normal effort GI: Abd soft, non-tender, gravid appropriate for gestational age. Pos BS x 4 MS: Extremities nontender, no edema, normal ROM Back: left lumbar pain Neurologic: Alert and oriented x 4.  GU: Neg CVAT.  Pelvic: NEFG, physiologic  discharge, no blood, cervix clean. No CMT  Dilation: Closed Effacement (%): 40 Cervical Position: Posterior Exam by:: K. WEissRN  FHT:  Baseline 135 , moderate variability, accelerations present, no decelerations Contractions: none   Labs: Results for orders placed or performed during the hospital encounter of 01/30/17 (from the past 24 hour(s))  Urinalysis, Routine w reflex microscopic     Status: Abnormal   Collection Time: 01/30/17  8:18 PM  Result Value Ref Range   Color, Urine YELLOW YELLOW   APPearance  HAZY (A) CLEAR   Specific Gravity, Urine 1.017 1.005 - 1.030   pH 6.0 5.0 - 8.0   Glucose, UA NEGATIVE NEGATIVE mg/dL   Hgb urine dipstick NEGATIVE NEGATIVE   Bilirubin Urine NEGATIVE NEGATIVE   Ketones, ur 5 (A) NEGATIVE mg/dL   Protein, ur NEGATIVE NEGATIVE mg/dL   Nitrite NEGATIVE NEGATIVE   Leukocytes, UA NEGATIVE NEGATIVE    Imaging:  No results found.  MAU Course: Orders Placed This Encounter  Procedures  . Urinalysis, Routine w reflex microscopic  . Discharge patient   Meds ordered this encounter  Medications  . acetaminophen (TYLENOL) 500 MG chewable tablet    Sig: Chew 1,000 mg by mouth every 6 (six) hours as needed for pain.  . cyclobenzaprine (FLEXERIL) tablet 5 mg  . cyclobenzaprine (FLEXERIL) 5 MG tablet    Sig: Take 1 tablet (5 mg total) by mouth every 8 (eight) hours as needed for muscle spasms.    Dispense:  20 tablet    Refill:  0    Order Specific Question:   Supervising Provider    Answer:   Reva Bores [[id number]]    Assessment: 1. Acute left-sided low back pain without sciatica   Given flexeril w/ relief of pain. UA unremarkable.  Plan: Pain c/w MSK etiology. No signs of nephrolithiasis. --Discharge home in stable condition --Labor precautions and fetal kick counts reviewed Follow-up Information    [facility] Follow up.   Contact information: [address] [place] [address] [phone]          Allergies as of 01/30/2017      Reactions   Imitrex [sumatriptan] Other (See Comments)   Chest pain, heaviness, migraine   Latex Rash      Medication List    TAKE these medications   acetaminophen 500 MG chewable tablet Commonly known as:  TYLENOL Chew 1,000 mg by mouth every 6 (six) hours as needed for pain.   amoxicillin-clavulanate 875-125 MG tablet Commonly known as:  AUGMENTIN Take 1 tablet by mouth 2 (two) times daily.   cyclobenzaprine 5 MG tablet Commonly known as:  FLEXERIL Take 1  tablet (5 mg total) by mouth every 8 (eight) hours as needed for muscle spasms.   IRON PO Take 1 tablet by mouth daily.   prenatal multivitamin Tabs tablet Take 1 tablet by mouth daily at 12 noon.       Wendee Beavers, DO, PGY-1 01/30/2017, 10:33 PM  The patient was seen and examined by me also Agree with note NST reactive and reassuring UCs as listed Cervical exams as listed in note Ready for discharge Will have her followup in clinic as scheduled Aviva Signs, CNM

## 2017-01-30 NOTE — Discharge Instructions (Signed)
Back Pain, Adult  Back pain is very common. The pain often gets better over time. The cause of back pain is usually not dangerous. Most people can learn to manage their back pain on their own.  Follow these instructions at home:  Watch your back pain for any changes. The following actions may help to lessen any pain you are feeling:  · Stay active. Start with short walks on flat ground if you can. Try to walk farther each day.  · Exercise regularly as told by your doctor. Exercise helps your back heal faster. It also helps avoid future injury by keeping your muscles strong and flexible.  · Do not sit, drive, or stand in one place for more than 30 minutes.  · Do not stay in bed. Resting more than 1-2 days can slow down your recovery.  · Be careful when you bend or lift an object. Use good form when lifting:  ? Bend at your knees.  ? Keep the object close to your body.  ? Do not twist.  · Sleep on a firm mattress. Lie on your side, and bend your knees. If you lie on your back, put a pillow under your knees.  · Take medicines only as told by your doctor.  · Put ice on the injured area.  ? Put ice in a plastic bag.  ? Place a towel between your skin and the bag.  ? Leave the ice on for 20 minutes, 2-3 times a day for the first 2-3 days. After that, you can switch between ice and heat packs.  · Avoid feeling anxious or stressed. Find good ways to deal with stress, such as exercise.  · Maintain a healthy weight. Extra weight puts stress on your back.    Contact a doctor if:  · You have pain that does not go away with rest or medicine.  · You have worsening pain that goes down into your legs or buttocks.  · You have pain that does not get better in one week.  · You have pain at night.  · You lose weight.  · You have a fever or chills.  Get help right away if:  · You cannot control when you poop (bowel movement) or pee (urinate).  · Your arms or legs feel weak.  · Your arms or legs lose feeling (numbness).  · You feel sick  to your stomach (nauseous) or throw up (vomit).  · You have belly (abdominal) pain.  · You feel like you may pass out (faint).  This information is not intended to replace advice given to you by your health care provider. Make sure you discuss any questions you have with your health care provider.  Document Released: 05/02/2008 Document Revised: 04/21/2016 Document Reviewed: 03/18/2014  Elsevier Interactive Patient Education © 2017 [employer]

## 2017-01-30 NOTE — MAU Note (Signed)
Pt was here this past Friday with c/o left flank pain, pt was sent home and states the pain in left flank remains the same. Reports taking amoxicillin for a tonsil stone and stopped taking antibiotic before the Rx was finished. Pt has been taking 1gm of tylenol as needed since Saturday. She says it provides some relief then the pain returns about one hr later.  Pos fetal movements.

## 2017-02-01 ENCOUNTER — Inpatient Hospital Stay (HOSPITAL_COMMUNITY)
Admission: AD | Admit: 2017-02-01 | Discharge: 2017-02-01 | Disposition: A | Discharge status: Home / Self Care | Payer: Medicaid Other | Source: Ambulatory Visit | Attending: Obstetrics & Gynecology | Admitting: Obstetrics & Gynecology

## 2017-02-01 ENCOUNTER — Encounter (HOSPITAL_COMMUNITY): Payer: Self-pay | Admitting: *Deleted

## 2017-02-01 DIAGNOSIS — Z87891 Personal history of nicotine dependence: Secondary | ICD-10-CM | POA: Insufficient documentation

## 2017-02-01 DIAGNOSIS — K5900 Constipation, unspecified: Secondary | ICD-10-CM | POA: Insufficient documentation

## 2017-02-01 DIAGNOSIS — Z3A35 35 weeks gestation of pregnancy: Secondary | ICD-10-CM | POA: Diagnosis not present

## 2017-02-01 DIAGNOSIS — R109 Unspecified abdominal pain: Secondary | ICD-10-CM | POA: Diagnosis present

## 2017-02-01 DIAGNOSIS — O26893 Other specified pregnancy related conditions, third trimester: Secondary | ICD-10-CM | POA: Insufficient documentation

## 2017-02-01 DIAGNOSIS — O99613 Diseases of the digestive system complicating pregnancy, third trimester: Secondary | ICD-10-CM

## 2017-02-01 DIAGNOSIS — O9989 Other specified diseases and conditions complicating pregnancy, childbirth and the puerperium: Secondary | ICD-10-CM | POA: Diagnosis not present

## 2017-02-01 LAB — CBC
HCT: 31.6 % — ABNORMAL LOW (ref 36.0–46.0)
Hemoglobin: 10.9 g/dL — ABNORMAL LOW (ref 12.0–15.0)
MCH: 34 pg (ref 26.0–34.0)
MCHC: 34.5 g/dL (ref 30.0–36.0)
MCV: 98.4 fL (ref 78.0–100.0)
PLATELETS: 249 10*3/uL (ref 150–400)
RBC: 3.21 MIL/uL — AB (ref 3.87–5.11)
RDW: 13.9 % (ref 11.5–15.5)
WBC: 7.1 10*3/uL (ref 4.0–10.5)

## 2017-02-01 LAB — URINALYSIS, ROUTINE W REFLEX MICROSCOPIC
BILIRUBIN URINE: NEGATIVE
Glucose, UA: NEGATIVE mg/dL
Hgb urine dipstick: NEGATIVE
Ketones, ur: NEGATIVE mg/dL
Leukocytes, UA: NEGATIVE
NITRITE: NEGATIVE
PROTEIN: NEGATIVE mg/dL
SPECIFIC GRAVITY, URINE: 1.005 (ref 1.005–1.030)
pH: 7 (ref 5.0–8.0)

## 2017-02-01 MED ORDER — DOCUSATE SODIUM 100 MG PO CAPS: 100.0000 mg | ORAL_CAPSULE | Freq: Two times a day (BID) | ORAL | 0 refills | Status: DC

## 2017-02-01 NOTE — Discharge Instructions (Signed)

## 2017-02-01 NOTE — MAU Provider Note (Signed)
History     CSN: [id number]  Arrival date and time: 02/01/17 [id number]   First Provider Initiated Contact with Patient 02/01/17 1915      Chief Complaint  Patient presents with  . Abdominal Pain   HPI Adriana [name] is a 21 y.o. G1P0 at 3942w4d who presents with abdominal pain. Symptoms began last Thursday. This is her third visit to MAU for same complaints. Reports constant pain in her left flank & LLQ. Describes as sharp & aching pain. Rates pain 5/10. Has not taken pain medication. Last BM was 1 week ago. Took miralax & smooth move yesterday which resulted in minimal BM. Denies dysuria, hematuria, fever/chills, n/v, vaginal bleeding, or LOF. Positive fetal movement.   OB History    Gravida Para Term Preterm AB Living   1             SAB TAB Ectopic Multiple Live Births                  Past Medical History:  Diagnosis Date  . Migraines     Past Surgical History:  Procedure Laterality Date  . HERNIA REPAIR      History reviewed. No pertinent family history.  Social History  Substance Use Topics  . Smoking status: Former Smoker    Quit date: 06/30/2016  . Smokeless tobacco: Never Used  . Alcohol use No    Allergies:  Allergies  Allergen Reactions  . Imitrex [Sumatriptan] Other (See Comments)    Chest pain, heaviness, migraine  . Latex Rash    Prescriptions Prior to Admission  Medication Sig Dispense Refill Last Dose  . acetaminophen (TYLENOL) 500 MG chewable tablet Chew 1,000 mg by mouth every 6 (six) hours as needed for pain.   01/30/2017 at 1700  . amoxicillin-clavulanate (AUGMENTIN) 875-125 MG tablet Take 1 tablet by mouth 2 (two) times daily. (Patient not taking: Reported on 01/30/2017) 14 tablet 0 Not Taking at Unknown time  . cyclobenzaprine (FLEXERIL) 5 MG tablet Take 1 tablet (5 mg total) by mouth every 8 (eight) hours as needed for muscle spasms. 20 tablet 0   . IRON PO Take 1 tablet by mouth daily.    Past Week at Unknown time  . Prenatal Vit-Fe Fumarate-FA  (PRENATAL MULTIVITAMIN) TABS tablet Take 1 tablet by mouth daily at 12 noon.   01/29/2017 at Unknown time    Review of Systems  Constitutional: Negative for chills and fever.  Gastrointestinal: Positive for abdominal pain and constipation. Negative for abdominal distention, blood in stool, diarrhea, nausea, rectal pain and vomiting.  Genitourinary: Positive for flank pain. Negative for dysuria, hematuria, vaginal bleeding and vaginal discharge.   Physical Exam   Blood pressure 121/72, pulse 92, temperature 98.2 F (36.8 C), temperature source Oral, resp. rate 17, weight 183 lb (83 kg), last menstrual period 05/28/2016, SpO2 100 %.  Physical Exam  Nursing note and vitals reviewed. Constitutional: She is oriented to person, place, and time. She appears well-developed and well-nourished. No distress.  HENT:  Head: Normocephalic and atraumatic.  Eyes: Conjunctivae are normal. Right eye exhibits no discharge. Left eye exhibits no discharge. No scleral icterus.  Neck: Normal range of motion.  Cardiovascular: Normal rate, regular rhythm and normal heart sounds.   No murmur heard. Respiratory: Effort normal and breath sounds normal. No respiratory distress. She has no wheezes.  GI: Soft. Bowel sounds are normal. There is no tenderness. There is no rebound, no guarding and no CVA tenderness.  Neurological: She is alert  and oriented to person, place, and time.  Skin: Skin is warm and dry. She is not diaphoretic.  Psychiatric: She has a normal mood and affect. Her behavior is normal. Judgment and thought content normal.   Dilation: Closed Effacement (%): 50 Cervical Position: Posterior Station: -3 Exam by:: Judeth Horn NP  Fetal Tracing:  Baseline: 140 Variability: moderate Accelerations: 15x15 Decelerations: none  Toco: irr ctx MAU Course  Procedures Results for orders placed or performed during the hospital encounter of 02/01/17 (from the past 24 hour(s))  Urinalysis, Routine w  reflex microscopic     Status: Abnormal   Collection Time: 02/01/17  6:24 PM  Result Value Ref Range   Color, Urine STRAW (A) YELLOW   APPearance CLEAR CLEAR   Specific Gravity, Urine 1.005 1.005 - 1.030   pH 7.0 5.0 - 8.0   Glucose, UA NEGATIVE NEGATIVE mg/dL   Hgb urine dipstick NEGATIVE NEGATIVE   Bilirubin Urine NEGATIVE NEGATIVE   Ketones, ur NEGATIVE NEGATIVE mg/dL   Protein, ur NEGATIVE NEGATIVE mg/dL   Nitrite NEGATIVE NEGATIVE   Leukocytes, UA NEGATIVE NEGATIVE  CBC     Status: Abnormal   Collection Time: 02/01/17  6:50 PM  Result Value Ref Range   WBC 7.1 4.0 - 10.5 K/uL   RBC 3.21 (L) 3.87 - 5.11 MIL/uL   Hemoglobin 10.9 (L) 12.0 - 15.0 g/dL   HCT 21.3 (L) 08.6 - 57.8 %   MCV 98.4 78.0 - 100.0 fL   MCH 34.0 26.0 - 34.0 pg   MCHC 34.5 30.0 - 36.0 g/dL   RDW 46.9 62.9 - 52.8 %   Platelets 249 150 - 400 K/uL    MDM Category 1 tracing Cervix closed VSS CBC -- no leukocytosis U/a -- no evidence of infection or hematuria Soap suds enema given which resulted in large bowel movement & improvement of symptoms Assessment and Plan  A: 1. Constipation during pregnancy in third trimester    P: Discharge home Rx colace Discussed reasons to return to MAU Keep follow up appointment with OB/PCP --tomorrow Discussed management of constipation  Judeth Horn 02/01/2017, 7:15 PM

## 2017-02-01 NOTE — MAU Note (Signed)
Having severe abd pains, lower abd and  On left side.is the same pain she has been having, but it is getting worse.  Denies urinary complaints.  Has been constipated.  Went yesterday, after she took something

## 2017-02-02 ENCOUNTER — Encounter: Payer: Self-pay | Admitting: Certified Nurse Midwife

## 2017-02-02 ENCOUNTER — Other Ambulatory Visit (HOSPITAL_COMMUNITY)
Admission: RE | Admit: 2017-02-02 | Discharge: 2017-02-02 | Disposition: A | Discharge status: Home / Self Care | Payer: Medicaid Other | Source: Ambulatory Visit | Attending: Certified Nurse Midwife | Admitting: Certified Nurse Midwife

## 2017-02-02 ENCOUNTER — Ambulatory Visit (INDEPENDENT_AMBULATORY_CARE_PROVIDER_SITE_OTHER): Payer: Medicaid Other | Admitting: Certified Nurse Midwife

## 2017-02-02 VITALS — BP 127/88 | HR 105 | Wt 183.2 lb

## 2017-02-02 DIAGNOSIS — O26899 Other specified pregnancy related conditions, unspecified trimester: Secondary | ICD-10-CM

## 2017-02-02 DIAGNOSIS — Z3403 Encounter for supervision of normal first pregnancy, third trimester: Secondary | ICD-10-CM | POA: Insufficient documentation

## 2017-02-02 DIAGNOSIS — O26893 Other specified pregnancy related conditions, third trimester: Secondary | ICD-10-CM

## 2017-02-02 DIAGNOSIS — Z3400 Encounter for supervision of normal first pregnancy, unspecified trimester: Secondary | ICD-10-CM

## 2017-02-02 DIAGNOSIS — Z3A35 35 weeks gestation of pregnancy: Secondary | ICD-10-CM | POA: Insufficient documentation

## 2017-02-02 DIAGNOSIS — R102 Pelvic and perineal pain: Secondary | ICD-10-CM

## 2017-02-02 LAB — POCT URINALYSIS DIPSTICK
Bilirubin, UA: NEGATIVE
Glucose, UA: NEGATIVE
Ketones, UA: NEGATIVE
Leukocytes, UA: NEGATIVE
NITRITE UA: NEGATIVE
PH UA: 6.5
PROTEIN UA: NEGATIVE
RBC UA: NEGATIVE
Spec Grav, UA: <=1.005
Urobilinogen, UA: NEGATIVE

## 2017-02-02 LAB — OB RESULTS CONSOLE GBS: GBS: NEGATIVE

## 2017-02-02 NOTE — Progress Notes (Signed)
Patient reports mild contractions. She has been to the hospital for side pain on the Left.

## 2017-02-02 NOTE — Progress Notes (Signed)
   PRENATAL VISIT NOTE  Subjective:  Adriana [name] is a 21 y.o. G1P0 at 2668w5d being seen today for ongoing prenatal care.  She is currently monitored for the following issues for this low-risk pregnancy and has Migraine headache with aura; Chlamydia infection affecting pregnancy in first trimester, antepartum; Supervision of normal first pregnancy, antepartum; Susceptible to varicella (non-immune), currently pregnant; and Anemia affecting pregnancy in third trimester on her problem list.  Patient reports backache, no bleeding, no cramping, no leaking, occasional contractions and left flank pain noted, has been seen 3 times in MAU for this problem, denies any fever, cough, congestion, sore throat, states that tylenol does not help\.  Contractions: Irregular. Vag. Bleeding: None.  Movement: Present. Denies leaking of fluid. Here for exam with grandmother.   The following portions of the patient's history were reviewed and updated as appropriate: allergies, current medications, past family history, past medical history, past social history, past surgical history and problem list. Problem list updated.  Objective:   Vitals:   02/02/17 1432  BP: 127/88  Pulse: (!) 105  Weight: 183 lb 3.2 oz (83.1 kg)  Temp: 98.0 oral.   Fetal Status: Fetal Heart Rate (bpm): 147 Fundal Height: 35 cm Movement: Present  Presentation: Vertex  General:  Alert, oriented and cooperative. Patient is in no acute distress.  Skin: Skin is warm and dry. No rash noted.   Cardiovascular: Normal heart rate noted  Respiratory: Normal respiratory effort, no problems with respiration noted  Abdomen: Soft, gravid, appropriate for gestational age. Pain/Pressure: Absent     Pelvic:  Cervical exam performed Dilation: Closed Effacement (%): 50 Station: -3  Extremities: Normal range of motion.  Edema: None  Mental Status: Normal mood and affect. Normal behavior. Normal judgment and thought content.   Assessment and Plan:    Pregnancy: G1P0 at 3168w5d  1. Supervision of normal first pregnancy, antepartum      Left flank back pain, CVA tenderness noted.   - Strep Gp B NAA - Cervicovaginal ancillary only  2. Pelvic pain affecting pregnancy, antepartum     +CVA tenderness with no known cause.   - POCT urinalysis dipstick - Urine culture  Preterm labor symptoms and general obstetric precautions including but not limited to vaginal bleeding, contractions, leaking of fluid and fetal movement were reviewed in detail with the patient. Please refer to After Visit Summary for other counseling recommendations.  Return in about 1 week (around 02/09/2017) for ROB.   Roe Coombsachelle A Coren Crownover, CNM

## 2017-02-03 LAB — CERVICOVAGINAL ANCILLARY ONLY
Bacterial vaginitis: NEGATIVE
CHLAMYDIA, DNA PROBE: NEGATIVE
Candida vaginitis: NEGATIVE
NEISSERIA GONORRHEA: NEGATIVE
TRICH (WINDOWPATH): NEGATIVE

## 2017-02-04 LAB — STREP GP B NAA: Strep Gp B NAA: NEGATIVE

## 2017-02-04 LAB — URINE CULTURE: Organism ID, Bacteria: NO GROWTH

## 2017-02-06 ENCOUNTER — Other Ambulatory Visit: Payer: Self-pay | Admitting: Certified Nurse Midwife

## 2017-02-06 DIAGNOSIS — Z3400 Encounter for supervision of normal first pregnancy, unspecified trimester: Secondary | ICD-10-CM

## 2017-02-08 ENCOUNTER — Inpatient Hospital Stay (HOSPITAL_COMMUNITY)
Admission: AD | Admit: 2017-02-08 | Discharge: 2017-02-11 | DRG: 775 | Disposition: A | Discharge status: Home / Self Care | Payer: Medicaid Other | Source: Ambulatory Visit | Attending: Obstetrics & Gynecology | Admitting: Obstetrics & Gynecology

## 2017-02-08 ENCOUNTER — Encounter (HOSPITAL_COMMUNITY): Payer: Self-pay | Admitting: *Deleted

## 2017-02-08 ENCOUNTER — Inpatient Hospital Stay (HOSPITAL_COMMUNITY): Payer: Medicaid Other | Admitting: Anesthesiology

## 2017-02-08 DIAGNOSIS — Z3400 Encounter for supervision of normal first pregnancy, unspecified trimester: Secondary | ICD-10-CM

## 2017-02-08 DIAGNOSIS — O9902 Anemia complicating childbirth: Secondary | ICD-10-CM | POA: Diagnosis present

## 2017-02-08 DIAGNOSIS — O42013 Preterm premature rupture of membranes, onset of labor within 24 hours of rupture, third trimester: Principal | ICD-10-CM | POA: Diagnosis present

## 2017-02-08 DIAGNOSIS — Z3A36 36 weeks gestation of pregnancy: Secondary | ICD-10-CM | POA: Diagnosis not present

## 2017-02-08 DIAGNOSIS — D649 Anemia, unspecified: Secondary | ICD-10-CM | POA: Diagnosis present

## 2017-02-08 DIAGNOSIS — Z2839 Other underimmunization status: Secondary | ICD-10-CM

## 2017-02-08 DIAGNOSIS — O98811 Other maternal infectious and parasitic diseases complicating pregnancy, first trimester: Secondary | ICD-10-CM

## 2017-02-08 DIAGNOSIS — O09899 Supervision of other high risk pregnancies, unspecified trimester: Secondary | ICD-10-CM

## 2017-02-08 DIAGNOSIS — Z87891 Personal history of nicotine dependence: Secondary | ICD-10-CM | POA: Diagnosis not present

## 2017-02-08 DIAGNOSIS — O42919 Preterm premature rupture of membranes, unspecified as to length of time between rupture and onset of labor, unspecified trimester: Secondary | ICD-10-CM | POA: Diagnosis present

## 2017-02-08 DIAGNOSIS — Z283 Underimmunization status: Secondary | ICD-10-CM

## 2017-02-08 DIAGNOSIS — A749 Chlamydial infection, unspecified: Secondary | ICD-10-CM

## 2017-02-08 LAB — CBC
HEMATOCRIT: 30.1 % — AB (ref 36.0–46.0)
Hemoglobin: 10.4 g/dL — ABNORMAL LOW (ref 12.0–15.0)
MCH: 33.7 pg (ref 26.0–34.0)
MCHC: 34.6 g/dL (ref 30.0–36.0)
MCV: 97.4 fL (ref 78.0–100.0)
Platelets: 281 10*3/uL (ref 150–400)
RBC: 3.09 MIL/uL — AB (ref 3.87–5.11)
RDW: 13.7 % (ref 11.5–15.5)
WBC: 8.6 10*3/uL (ref 4.0–10.5)

## 2017-02-08 LAB — TYPE AND SCREEN
ABO/RH(D): A POS
Antibody Screen: NEGATIVE

## 2017-02-08 LAB — ABO/RH: ABO/RH(D): A POS

## 2017-02-08 LAB — POCT FERN TEST: POCT Fern Test: POSITIVE

## 2017-02-08 MED ORDER — FLEET ENEMA 7-19 GM/118ML RE ENEM
1.0000 | ENEMA | RECTAL | Status: DC | PRN
Start: 2017-02-08 — End: 2017-02-09

## 2017-02-08 MED ORDER — LIDOCAINE HCL (PF) 1 % IJ SOLN
INTRAMUSCULAR | Status: DC | PRN
  Administered 2017-02-08 (×2): 6 mL via EPIDURAL

## 2017-02-08 MED ORDER — OXYCODONE-ACETAMINOPHEN 5-325 MG PO TABS: 2.0000 | ORAL_TABLET | ORAL | Status: DC | PRN

## 2017-02-08 MED ORDER — PENICILLIN G POTASSIUM 5000000 UNITS IJ SOLR
5.0000 10*6.[IU] | Freq: Once | INTRAVENOUS | Status: AC
  Administered 2017-02-08: 5 10*6.[IU] via INTRAVENOUS
  Filled 2017-02-08: qty 5

## 2017-02-08 MED ORDER — PHENYLEPHRINE 40 MCG/ML (10ML) SYRINGE FOR IV PUSH (FOR BLOOD PRESSURE SUPPORT)
80.0000 ug | PREFILLED_SYRINGE | INTRAVENOUS | Status: DC | PRN
  Filled 2017-02-08: qty 5
  Filled 2017-02-08: qty 10

## 2017-02-08 MED ORDER — PENICILLIN G POT IN DEXTROSE 60000 UNIT/ML IV SOLN
3.0000 10*6.[IU] | INTRAVENOUS | Status: DC
  Administered 2017-02-08: 3 10*6.[IU] via INTRAVENOUS
  Filled 2017-02-08 (×3): qty 50

## 2017-02-08 MED ORDER — LIDOCAINE HCL (PF) 1 % IJ SOLN
30.0000 mL | INTRAMUSCULAR | Status: DC | PRN
  Filled 2017-02-08: qty 30

## 2017-02-08 MED ORDER — TERBUTALINE SULFATE 1 MG/ML IJ SOLN
0.2500 mg | Freq: Once | INTRAMUSCULAR | Status: DC | PRN
  Filled 2017-02-08: qty 1

## 2017-02-08 MED ORDER — DIPHENHYDRAMINE HCL 50 MG/ML IJ SOLN: 12.5000 mg | INTRAMUSCULAR | Status: DC | PRN

## 2017-02-08 MED ORDER — LACTATED RINGERS IV SOLN: 500.0000 mL | INTRAVENOUS | Status: DC | PRN

## 2017-02-08 MED ORDER — OXYTOCIN 40 UNITS IN LACTATED RINGERS INFUSION - SIMPLE MED
1.0000 m[IU]/min | INTRAVENOUS | Status: DC
  Administered 2017-02-08: 2 m[IU]/min via INTRAVENOUS
  Filled 2017-02-08: qty 1000

## 2017-02-08 MED ORDER — OXYTOCIN BOLUS FROM INFUSION
500.0000 mL | Freq: Once | INTRAVENOUS | Status: AC
  Administered 2017-02-09: 500 mL via INTRAVENOUS

## 2017-02-08 MED ORDER — FENTANYL CITRATE (PF) 100 MCG/2ML IJ SOLN
50.0000 ug | INTRAMUSCULAR | Status: DC | PRN
  Administered 2017-02-08: 50 ug via INTRAVENOUS
  Administered 2017-02-08: 100 ug via INTRAVENOUS
  Filled 2017-02-08 (×2): qty 2

## 2017-02-08 MED ORDER — LACTATED RINGERS IV SOLN: 500.0000 mL | Freq: Once | INTRAVENOUS | Status: DC

## 2017-02-08 MED ORDER — OXYTOCIN 40 UNITS IN LACTATED RINGERS INFUSION - SIMPLE MED: 2.5000 [IU]/h | INTRAVENOUS | Status: DC

## 2017-02-08 MED ORDER — ONDANSETRON HCL 4 MG/2ML IJ SOLN: 4.0000 mg | Freq: Four times a day (QID) | INTRAMUSCULAR | Status: DC | PRN

## 2017-02-08 MED ORDER — EPHEDRINE 5 MG/ML INJ
10.0000 mg | INTRAVENOUS | Status: DC | PRN
  Filled 2017-02-08: qty 2

## 2017-02-08 MED ORDER — LACTATED RINGERS IV SOLN
INTRAVENOUS | Status: DC
  Administered 2017-02-08 (×3): via INTRAVENOUS

## 2017-02-08 MED ORDER — PHENYLEPHRINE 40 MCG/ML (10ML) SYRINGE FOR IV PUSH (FOR BLOOD PRESSURE SUPPORT)
80.0000 ug | PREFILLED_SYRINGE | INTRAVENOUS | Status: DC | PRN
  Filled 2017-02-08: qty 5

## 2017-02-08 MED ORDER — SOD CITRATE-CITRIC ACID 500-334 MG/5ML PO SOLN: 30.0000 mL | ORAL | Status: DC | PRN

## 2017-02-08 MED ORDER — BETAMETHASONE SOD PHOS & ACET 6 (3-3) MG/ML IJ SUSP
12.0000 mg | INTRAMUSCULAR | Status: DC
  Administered 2017-02-08: 12 mg via INTRAMUSCULAR
  Filled 2017-02-08 (×2): qty 2

## 2017-02-08 MED ORDER — OXYCODONE-ACETAMINOPHEN 5-325 MG PO TABS: 1.0000 | ORAL_TABLET | ORAL | Status: DC | PRN

## 2017-02-08 MED ORDER — FENTANYL 2.5 MCG/ML BUPIVACAINE 1/10 % EPIDURAL INFUSION (WH - ANES)
14.0000 mL/h | INTRAMUSCULAR | Status: DC | PRN
  Administered 2017-02-08: 14 mL/h via EPIDURAL
  Filled 2017-02-08: qty 100

## 2017-02-08 MED ORDER — MISOPROSTOL 200 MCG PO TABS
50.0000 ug | ORAL_TABLET | ORAL | Status: DC | PRN
  Administered 2017-02-08 (×2): 50 ug via ORAL
  Filled 2017-02-08 (×2): qty 0.5

## 2017-02-08 MED ORDER — ACETAMINOPHEN 325 MG PO TABS: 650.0000 mg | ORAL_TABLET | ORAL | Status: DC | PRN

## 2017-02-08 NOTE — Progress Notes (Signed)
Adriana Aweubreona Brownis a 21 y.o.G1P0 at 8441w4d here for IOL for PPROM  S Starting to feel contractions  O BP 120/71   Pulse 89   Temp 98.6 F (37 C) (Oral)   Resp 18   Ht 5\' 7"  (1.702 m)   Wt 182 lb (82.6 kg)   LMP 05/28/2016 (Approximate)   BMI 28.51 kg/m  Fetal monitoring 130 baseline, moderate variability, +acels and no decels  A tolerating IOL well  P Expect SVD  Adair [name] MD PGY1

## 2017-02-08 NOTE — H&P (Signed)
LABOR AND DELIVERY ADMISSION HISTORY AND PHYSICAL NOTE  Adriana [name] is a 21 y.o. female G1P0 with IUP at [redacted]w[redacted]d by LMP and [place] presenting for PPROM. Pt states she went to lay down in her bed around 01:30 and felt urge to use urinate and noticed her mucous plug cam out. She then began to have leakage of fluid which persisted through the early morning prompting visit to MAU. She denies h/o contractions, or vaginal bleeding. She reports positive fetal movement.   Prenatal History/Complications: Chlamydia affecting first trimester (TOC 09/07/2016) Migraine headache with aura Non-immune varicella zoster Anemia affecting third trimester  Past Medical History: Past Medical History:  Diagnosis Date  . Migraines     Past Surgical History: Past Surgical History:  Procedure Laterality Date  . HERNIA REPAIR      Obstetrical History: OB History    Gravida Para Term Preterm AB Living   1             SAB TAB Ectopic Multiple Live Births                  Social History: Social History   Social History  . Marital status: Single    Spouse name: N/A  . Number of children: N/A  . Years of education: N/A   Social History Main Topics  . Smoking status: Former Smoker    Quit date: 06/30/2016  . Smokeless tobacco: Never Used  . Alcohol use No  . Drug use: No  . Sexual activity: Yes    Birth control/ protection: None   Other Topics Concern  . None   Social History Narrative  . None    Family History: History reviewed. No pertinent family history.  Allergies: Allergies  Allergen Reactions  . Imitrex [Sumatriptan] Other (See Comments)    Chest pain, heaviness, migraine  . Latex Rash    Prescriptions Prior to Admission  Medication Sig Dispense Refill Last Dose  . cyclobenzaprine (FLEXERIL) 5 MG tablet Take 1 tablet (5 mg total) by mouth every 8 (eight) hours as needed for muscle spasms. 20 tablet 0 Past Week at Unknown time  . docusate sodium (COLACE) 100 MG capsule Take 1  capsule (100 mg total) by mouth 2 (two) times daily. 30 capsule 0 02/07/2017 at Unknown time  . Prenatal Vit-Fe Fumarate-FA (PRENATAL MULTIVITAMIN) TABS tablet Take 1 tablet by mouth daily at 12 noon.   02/07/2017 at Unknown time  . acetaminophen (TYLENOL) 500 MG chewable tablet Chew 1,000 mg by mouth every 6 (six) hours as needed for pain.   Taking  . IRON PO Take 1 tablet by mouth daily.    Taking     Review of Systems   All systems reviewed and negative except as stated in HPI  Blood pressure 132/79, pulse 109, temperature 98 F (36.7 C), temperature source Oral, resp. rate 20, last menstrual period 05/28/2016. General appearance: alert, cooperative and no distress Lungs: clear to auscultation bilaterally Heart: regular rate and rhythm Abdomen: soft, non-tender; bowel sounds normal Extremities: No calf swelling or tenderness Presentation: cephalic Fetal monitoring: FHR 135 bpm, moderate variability, accelerations present, no decelerations Uterine activity: none Dilation: 1 Effacement (%): Thick   Prenatal labs: ABO, Rh: A/Positive/-- (09/13 1344) Antibody: Negative (09/13 1344) Rubella: !Error! RPR: Non Reactive (01/03 1028)  HBsAg: Negative (09/13 1344)  HIV: Non Reactive (01/03 1028)  GBS: Negative (03/08 1517)  2 hr Glucola: Normal (73/80/91) Genetic screening:  Declined Anatomy US: Normal  Prenatal Transfer Tool  Maternal  Diabetes: No Genetic Screening: Declined Maternal Ultrasounds/Referrals: Normal Fetal Ultrasounds or other Referrals:  None Maternal Substance Abuse:  No Significant Maternal Medications:  None Significant Maternal Lab Results: None  No results found for this or any previous visit (from the past 24 hour(s)).  Patient Active Problem List   Diagnosis Date Noted  . Anemia affecting pregnancy in third trimester 01/04/2017  . Susceptible to varicella (non-immune), currently pregnant 08/11/2016  . Supervision of normal first pregnancy, antepartum  08/10/2016  . Chlamydia infection affecting pregnancy in first trimester, antepartum 07/24/2016  . Migraine headache with aura 04/16/2014    Assessment: Adriana [name] is a 21 y.o. G1P0 at 7714w4d here for PPROM.  #Labor: expectant management for now #Pain: No pain meds at present #FWB: Category I #ID:  GBS negative #MOF: Breast #MOC:Depo #Circ:  N/A  Wendee Beaversavid J McMullen, DO, PGY-1 02/08/2017, 2:46 AM   I spoke with and examined patient and agree with resident/PA/SNM's note and plan of care.  G1P0 6914w4d admitted for PPROM clear fluid @ 0130, not feeling any uc's, although she is having them irregularly. No vb. Good fm.  Cat 1 FHR Will see if she begins to labor on her own, if not, will induce BMZ q24hr x 2 d/t <37wks Cheral MarkerKimberly R. Jesseka Drinkard, CNM, [facility]NP-BC 02/08/2017 3:57 AM

## 2017-02-08 NOTE — Progress Notes (Signed)
Adriana [name] is a 21 y.o. G1P0 at 340w4d   Subjective: Patient comfortable. No contractions still.  Objective: BP (!) 112/55   Pulse 89   Temp 98 F (36.7 C) (Oral)   Resp 16   Ht 5\' 7"  (1.702 m)   Wt 182 lb (82.6 kg)   LMP 05/28/2016 (Approximate)   BMI 28.51 kg/m  No intake/output data recorded. No intake/output data recorded.  FHT:  FHR: 130 bpm, variability: moderate,  accelerations:  Present,  decelerations:  Absent UC:   none SVE:   Dilation: 1 Effacement (%): Thick Exam by:: Dr. Abelardo DieselMcmullen  Labs: Lab Results  Component Value Date   WBC 8.6 02/08/2017   HGB 10.4 (L) 02/08/2017   HCT 30.1 (L) 02/08/2017   MCV 97.4 02/08/2017   PLT 281 02/08/2017    Assessment / Plan: Induction of labor due to PPROM, will need to start cytotec.  Labor: Initiating cytotec Preeclampsia:  None Fetal Wellbeing:  Category I Pain Control:  Labor support without medications I/D:  GBS negative Anticipated MOD:  NSVD  Wendee Beaversavid J Johan Antonacci, DO, PGY-1 02/08/2017, 7:32 AM

## 2017-02-08 NOTE — MAU Note (Signed)
PT  SAYS  AT 0130- WENT  TO B-ROOM-   SHE  SAW   CLEAR  MUCUS   COME  OUT -  FLUID   KEEPS  COMING  OUT  . PNC-  FAMINA-  VE  - CLOSED    02-02-2017.   DENIES HSV AND  MRSA.  GBS- UNSURE

## 2017-02-08 NOTE — Progress Notes (Signed)
Adriana Aweubreona Brownis a 21 y.o.G1P0 at 4941w4d here for IOL for PPROM  S: Uncomfortable. Feeling pain.   O: BP (!) 144/87   Pulse (!) 103   Temp 98.3 F (36.8 C) (Oral)   Resp 17   Ht 5\' 7"  (1.702 m)   Wt 182 lb (82.6 kg)   LMP 05/28/2016 (Approximate)   BMI 28.51 kg/m  Fetal monitoring: baseline 140, moderate variability, + acels, no decels Contractions more regular, q3-574min SVE 2/80/0  A: Progressing  P: Expect SVD  Adair [name] MD PGY1

## 2017-02-08 NOTE — Anesthesia Preprocedure Evaluation (Signed)
Anesthesia Evaluation  Patient identified by MRN, date of birth, ID band Patient awake    Reviewed: Allergy & Precautions, H&P , NPO status , Patient's Chart, lab work & pertinent test results  Airway Mallampati: I  TM Distance: >3 FB Neck ROM: full    Dental no notable dental hx.    Pulmonary former smoker,    Pulmonary exam normal       Cardiovascular negative cardio ROS Normal cardiovascular exam    Neuro/Psych negative psych ROS   GI/Hepatic negative GI ROS, Neg liver ROS,   Endo/Other  negative endocrine ROS  Renal/GU negative Renal ROS     Musculoskeletal   Abdominal Normal abdominal exam  (+)   Peds  Hematology   Anesthesia Other Findings   Reproductive/Obstetrics (+) Pregnancy                             Anesthesia Physical Anesthesia Plan  ASA: II  Anesthesia Plan: Epidural   Post-op Pain Management:    Induction:   Airway Management Planned:   Additional Equipment:   Intra-op Plan:   Post-operative Plan:   Informed Consent: I have reviewed the patients History and Physical, chart, labs and discussed the procedure including the risks, benefits and alternatives for the proposed anesthesia with the patient or authorized representative who has indicated his/her understanding and acceptance.     Plan Discussed with:   Anesthesia Plan Comments:         Anesthesia Quick Evaluation  

## 2017-02-08 NOTE — Progress Notes (Signed)
Adriana [name] is a 21 y.o. G1P0 at 10949w4d   Subjective: Patient received epidural. Pain is now better controlled.  Objective: BP 133/80   Pulse (!) 101   Temp 98.3 F (36.8 C) (Oral)   Resp 16   Ht 5\' 7"  (1.702 m)   Wt 182 lb (82.6 kg)   LMP 05/28/2016 (Approximate)   BMI 28.51 kg/m  No intake/output data recorded. No intake/output data recorded.  FHT:  FHR: 135 bpm, variability: moderate,  accelerations:  Present,  decelerations:  Absent UC:   regular, every 2 minutes SVE:   Dilation: 2 Effacement (%): 80 Station: -1, 0 Exam by:: Dr. Silvio ClaymanAmaral  Labs: Lab Results  Component Value Date   WBC 8.6 02/08/2017   HGB 10.4 (L) 02/08/2017   HCT 30.1 (L) 02/08/2017   MCV 97.4 02/08/2017   PLT 281 02/08/2017    Assessment / Plan: Induction of labor due to PPROM, progressing well on pitocin, will titrate to achieve adequate labor  Labor: Progressing normally Preeclampsia:  None Fetal Wellbeing:  Category I Pain Control:  Epidural I/D:  GBS negative Anticipated MOD:  NSVD  Wendee Beaversavid J Richmond Coldren, DO, PGY-1 02/08/2017, 8:35 PM

## 2017-02-08 NOTE — Anesthesia Procedure Notes (Signed)
Epidural Patient location during procedure: OB Start time: 02/08/2017 8:16 PM End time: 02/08/2017 8:18 PM  Staffing Anesthesiologist: Leilani AbleHATCHETT, Barkley Kratochvil Performed: anesthesiologist   Preanesthetic Checklist Completed: patient identified, surgical consent, pre-op evaluation, timeout performed, IV checked, risks and benefits discussed and monitors and equipment checked  Epidural Patient position: sitting Prep: site prepped and draped and DuraPrep Patient monitoring: continuous pulse ox and blood pressure Approach: midline Location: L3-L4 Injection technique: LOR air  Needle:  Needle type: Tuohy  Needle gauge: 17 G Needle length: 9 cm and 9 Needle insertion depth: 5 cm cm Catheter type: closed end flexible Catheter size: 19 Gauge Catheter at skin depth: 10 cm Test dose: negative and Other  Assessment Sensory level: T9 Events: blood not aspirated, injection not painful, no injection resistance, negative IV test and no paresthesia  Additional Notes Reason for block:procedure for pain

## 2017-02-08 NOTE — Anesthesia Pain Management Evaluation Note (Signed)
  CRNA Pain Management Visit Note  Patient: Adriana [name], 21 y.o., female  "Hello I am a [facility] the [facility]. We have an anesthesia team available at all times to provide care throughout the hospital, including epidural management and anesthesia for C-section. I don't know your plan for the delivery whether it a natural birth, water birth, IV sedation, nitrous supplementation, doula or epidural, but we want to meet your pain goals."   1.Was your pain managed to your expectations on prior hospitalizations?   No prior hospitalizations  2.What is your expectation for pain management during this hospitalization?     Epidural  3.How can we help you reach that goal? Epidural when I am at a point in labor to get one.  Record the patient's initial score and the patient's pain goal.   Pain: 1  Pain Goal: 5 [facility] wants you to be able to say your pain was always managed very well.  Ashlin Hidalgo 02/08/2017

## 2017-02-08 NOTE — Progress Notes (Signed)
Adriana Aweubreona Brownis a 21 y.o.G1P0 at 3880w4d here for IOL for PPROM S: Comfortable, sitting at the ball  O: BP 122/74   Pulse 92   Temp 97.6 F (36.4 C) (Axillary)   Resp 18   Ht 5\' 7"  (1.702 m)   Wt 182 lb (82.6 kg)   LMP 05/28/2016 (Approximate)   BMI 28.51 kg/m  SVE: due in one hour. Previous was 1/thick/posterior Fetal monitoring baseline 130, mod variability, +acels, no decels  A expecting next cervical check  P expect SVD  Adriana [name] MD PGY1

## 2017-02-08 NOTE — Progress Notes (Signed)
Adriana [name] is a 21 y.o. G1P0 at 5925w4d here for IOL for PPROM  S: Comfortable, just had breakfast.  O BP 123/77   Pulse 84   Temp 97.9 F (36.6 C) (Oral)   Resp 17   Ht 5\' 7"  (1.702 m)   Wt 182 lb (82.6 kg)   LMP 05/28/2016 (Approximate)   BMI 28.51 kg/m  Fetal monitoring: baseline 130, moderate variability, acels +, no decels SVE: 1/thick/posterior Uc: none at the moment  A: IOL for PPROM  P Cytotec 50mcg PO now Expect SVD  Adair [name] MD PGY1

## 2017-02-08 NOTE — Progress Notes (Signed)
Adriana Aweubreona Brownis a 21 y.o.G1P0 at 1973w4d here for IOL for PPROM S: Comfortable  O: BP 122/74   Pulse 92   Temp 97.6 F (36.4 C) (Axillary)   Resp 18   Ht 5\' 7"  (1.702 m)   Wt 182 lb (82.6 kg)   LMP 05/28/2016 (Approximate)   BMI 28.51 kg/m  SVE: 1/80/-1, vertex, head well placed Fetal monitoring baseline 130, mod variability, +acels, no decels  A progressing  P cytotec 2nd dose, expect SVD  Adair [name] MD PGY1

## 2017-02-09 ENCOUNTER — Encounter (HOSPITAL_COMMUNITY): Payer: Self-pay

## 2017-02-09 DIAGNOSIS — Z3A36 36 weeks gestation of pregnancy: Secondary | ICD-10-CM

## 2017-02-09 DIAGNOSIS — O42013 Preterm premature rupture of membranes, onset of labor within 24 hours of rupture, third trimester: Secondary | ICD-10-CM

## 2017-02-09 LAB — RPR: RPR: NONREACTIVE

## 2017-02-09 MED ORDER — COCONUT OIL OIL: 1.0000 "application " | TOPICAL_OIL | Status: DC | PRN

## 2017-02-09 MED ORDER — ACETAMINOPHEN 325 MG PO TABS: 650.0000 mg | ORAL_TABLET | ORAL | Status: DC | PRN

## 2017-02-09 MED ORDER — DIBUCAINE 1 % RE OINT
1.0000 "application " | TOPICAL_OINTMENT | RECTAL | Status: DC | PRN
  Administered 2017-02-09: 1 via RECTAL
  Filled 2017-02-09: qty 28

## 2017-02-09 MED ORDER — ONDANSETRON HCL 4 MG/2ML IJ SOLN: 4.0000 mg | INTRAMUSCULAR | Status: DC | PRN

## 2017-02-09 MED ORDER — WITCH HAZEL-GLYCERIN EX PADS
1.0000 "application " | MEDICATED_PAD | CUTANEOUS | Status: DC | PRN
  Administered 2017-02-09: 1 via TOPICAL

## 2017-02-09 MED ORDER — SIMETHICONE 80 MG PO CHEW: 80.0000 mg | CHEWABLE_TABLET | ORAL | Status: DC | PRN

## 2017-02-09 MED ORDER — IBUPROFEN 600 MG PO TABS
600.0000 mg | ORAL_TABLET | Freq: Four times a day (QID) | ORAL | Status: DC
  Administered 2017-02-09 – 2017-02-11 (×10): 600 mg via ORAL
  Filled 2017-02-09 (×10): qty 1

## 2017-02-09 MED ORDER — DIPHENHYDRAMINE HCL 25 MG PO CAPS: 25.0000 mg | ORAL_CAPSULE | Freq: Four times a day (QID) | ORAL | Status: DC | PRN

## 2017-02-09 MED ORDER — PRENATAL MULTIVITAMIN CH
1.0000 | ORAL_TABLET | Freq: Every day | ORAL | Status: DC
  Administered 2017-02-09 – 2017-02-11 (×3): 1 via ORAL
  Filled 2017-02-09 (×3): qty 1

## 2017-02-09 MED ORDER — SENNOSIDES-DOCUSATE SODIUM 8.6-50 MG PO TABS
2.0000 | ORAL_TABLET | ORAL | Status: DC
  Administered 2017-02-09 – 2017-02-11 (×2): 2 via ORAL
  Filled 2017-02-09 (×2): qty 2

## 2017-02-09 MED ORDER — OXYCODONE HCL 5 MG PO TABS: 5.0000 mg | ORAL_TABLET | ORAL | Status: DC | PRN

## 2017-02-09 MED ORDER — TETANUS-DIPHTH-ACELL PERTUSSIS 5-2.5-18.5 LF-MCG/0.5 IM SUSP: 0.5000 mL | Freq: Once | INTRAMUSCULAR | Status: DC

## 2017-02-09 MED ORDER — ZOLPIDEM TARTRATE 5 MG PO TABS: 5.0000 mg | ORAL_TABLET | Freq: Every evening | ORAL | Status: DC | PRN

## 2017-02-09 MED ORDER — BENZOCAINE-MENTHOL 20-0.5 % EX AERO
1.0000 "application " | INHALATION_SPRAY | CUTANEOUS | Status: DC | PRN
  Administered 2017-02-09: 1 via TOPICAL
  Filled 2017-02-09: qty 56

## 2017-02-09 MED ORDER — ONDANSETRON HCL 4 MG PO TABS: 4.0000 mg | ORAL_TABLET | ORAL | Status: DC | PRN

## 2017-02-09 NOTE — Lactation Note (Signed)
This note was copied from a baby's chart. Lactation Consultation Note: Mother paged for latch assistance. Mother has infant skin to skin not showing any feeding cue. Attempt wake up exercises, suck training, with mothers finger and feeding infant 0.5 ml of colostrum by spoon. Infant placed in cross cradle hold. Infant still not showing cues. Advised mother to continue to do skin to skin. Encouragement given to mother. Advised to practice breast massage and hand expression. Mother to page for latch assist the next feeding attempt.   Patient Name: [name] BJYNW'GToday's Date: 02/09/2017 Reason for consult: Follow-up assessment   Maternal Data Has patient been taught Hand Expression?: Yes Does the patient have breastfeeding experience prior to this delivery?: No  Feeding Feeding Type: Breast Fed Length of feed: 20 min  LATCH Score/Interventions Latch: Grasps breast easily, tongue down, lips flanged, rhythmical sucking. (Simultaneous filing. User may not have seen previous data.) Intervention(s): Breast compression;Breast massage;Assist with latch;Adjust position (Simultaneous filing. User may not have seen previous data.)  Audible Swallowing: Spontaneous and intermittent (Simultaneous filing. User may not have seen previous data.) Intervention(s): Hand expression;Skin to skin (Simultaneous filing. User may not have seen previous data.) Intervention(s): Skin to skin;Hand expression;Alternate breast massage (Simultaneous filing. User may not have seen previous data.)  Type of Nipple: Everted at rest and after stimulation (Simultaneous filing. User may not have seen previous data.)  Comfort (Breast/Nipple): Soft / non-tender (Simultaneous filing. User may not have seen previous data.)     Hold (Positioning): Assistance needed to correctly position infant at breast and maintain latch. (Simultaneous filing. User may not have seen previous data.) Intervention(s): Breastfeeding basics  reviewed;Support Pillows;Position options;Skin to skin (Simultaneous filing. User may not have seen previous data.)  LATCH Score: 9 (Simultaneous filing. User may not have seen previous data.)  Lactation Tools Discussed/Used     Consult Status Consult Status: Follow-up Date: 02/10/17 Follow-up type: In-patient    Stevan BornKendrick, Eward Rutigliano [facility] 02/09/2017, 2:32 PM

## 2017-02-09 NOTE — Lactation Note (Addendum)
This note was copied from a baby's chart. Lactation Consultation Note  Baby 16 hours old.  4192w5d. Latched upon entering on L side.  Sucks and swallows observed for more than 20 min. Demonstrated how to re-latch for depth and compress breast during feeding. R side has blister on tip.  Demonstrated how to hand express and gave baby drops on spoon between sides. Suggest applying ebm for soreness. Baby re-latched on R side after approx 15 min on L side. Encouraged feeding baby q 3hr and pumping after feeding with DEBP. Suggest mother hand express as much as possible and give to baby on spoon.  Mother was able to independently express drops. Discussed milk storage and LPI feeding behavior.  Patient Name: Adriana [name] Today's Date: 02/09/2017 Reason for consult: Late preterm infant   Maternal Data    Feeding Feeding Type: Breast Fed  LATCH Score/Interventions Latch: Grasps breast easily, tongue down, lips flanged, rhythmical sucking. Intervention(s): Skin to skin Intervention(s): Adjust position;Assist with latch;Breast massage;Breast compression  Audible Swallowing: A few with stimulation Intervention(s): Skin to skin;Hand expression Intervention(s): Skin to skin;Hand expression  Type of Nipple: Everted at rest and after stimulation Intervention(s): Double electric pump  Comfort (Breast/Nipple): Filling, red/small blisters or bruises, mild/mod discomfort  Problem noted: Mild/Moderate discomfort Interventions (Mild/moderate discomfort): Hand expression (depth and ebm)  Hold (Positioning): Assistance needed to correctly position infant at breast and maintain latch. Intervention(s): Breastfeeding basics reviewed;Support Pillows;Skin to skin;Position options  LATCH Score: 7  Lactation Tools Discussed/Used Pump Review: Milk Storage   Consult Status Consult Status: Follow-up Date: 02/10/17 Follow-up type: In-patient    Dahlia ByesBerkelhammer, Earley Grobe [facility] 02/09/2017, 5:00  PM

## 2017-02-09 NOTE — Anesthesia Postprocedure Evaluation (Signed)
Anesthesia Post Note  Patient: Adriana [name]  Procedure(s) Performed: * No procedures listed *  Patient location during evaluation: Mother Baby Anesthesia Type: Epidural Level of consciousness: awake and alert and oriented Pain management: satisfactory to patient Vital Signs Assessment: post-procedure vital signs reviewed and stable Respiratory status: spontaneous breathing and nonlabored ventilation Cardiovascular status: stable Postop Assessment: no headache, no backache, no signs of nausea or vomiting, adequate PO intake and patient able to bend at knees (patient up walking) Anesthetic complications: no        Last Vitals:  Vitals:   02/09/17 0240 02/09/17 0340  BP: 122/63 120/73  Pulse: 88 95  Resp: 18 18  Temp: 36.7 C 36.6 C    Last Pain:  Vitals:   02/09/17 0605  TempSrc:   PainSc: 0-No pain   Pain Goal:                 Madison HickmanGREGORY,Shekia Kuper

## 2017-02-09 NOTE — Progress Notes (Signed)
Post Partum Day #0 Subjective: no complaints, up ad lib, voiding, tolerating PO and reports breast feeding difficulty, lactation consult ordered.  Objective: Blood pressure 120/73, pulse 95, temperature 97.8 F (36.6 C), temperature source Oral, resp. rate 18, height 5\' 7"  (1.702 m), weight 182 lb (82.6 kg), last menstrual period 05/28/2016, unknown if currently breastfeeding.  Physical Exam:  General: alert, cooperative and no distress Lochia: appropriate Uterine Fundus: firm Incision: none DVT Evaluation: No evidence of DVT seen on physical exam. No cords or calf tenderness. No significant calf/ankle edema.   Recent Labs  02/08/17 0255  HGB 10.4*  HCT 30.1*    Assessment/Plan: Breastfeeding, Lactation consult and Contraception depo injections   LOS: 1 day   Roe CoombsRachelle A Lidya Mccalister, CNM 02/09/2017, 7:29 AM

## 2017-02-09 NOTE — Lactation Note (Signed)
This note was copied from a baby's chart. Lactation Consultation Note  Patient Name: Girl Elenor Quinonesubreona Manring ZOXWR'UToday's Date: 02/09/2017 Reason for consult: Initial assessment;Late preterm infant   4143w5d first baby, 6211 hrs old.   Mom had baby in cradle hold, pulling breast away from baby's nose.  Assisted with repositioning baby to cross cradle hold, chin and cheeks in closer than nose.  Baby latched deeply. Taught Mom to use alternate breast compression, and swallowing identified.  Encouraged STS and cue based feedings, goal is 8-12 feedings per 24 hrs.   Later Preterm Infant handout given, encouraged Mom to read.  Explained to expect baby to possibly become more sleepy on day 2 and 3.   DEBP set up at bedside.  Mom post BF pumping.  Encouraged hand expression, Mom wanting review.  Demonstration done, and encouraged going from breast to breast to remove milk between feedings.  Talked about breast massage as well. To call for assistance as needed, and Lactation to follow up daily.   Consult Status Consult Status: Follow-up Date: 02/10/17 Follow-up type: In-patient    Judee ClaraSmith, Marlean Mortell E 02/09/2017, 11:58 AM

## 2017-02-10 ENCOUNTER — Encounter: Payer: Medicaid Other | Admitting: Certified Nurse Midwife

## 2017-02-10 MED ORDER — BREAST PUMP MISC: 0 refills | Status: DC

## 2017-02-10 NOTE — Progress Notes (Signed)
Post Partum Day#1 s/p NSVD  Subjective: no complaints, up ad lib, voiding and tolerating PO  Objective: Blood pressure 117/65, pulse 72, temperature 98.2 F (36.8 C), temperature source Oral, resp. rate 20, height 5\' 7"  (1.702 m), weight 82.6 kg (182 lb), last menstrual period 05/28/2016, SpO2 99 %, unknown if currently breastfeeding.  Physical Exam:  General: alert Lochia: appropriate Uterine Fundus: firm and NT at U-2 DVT Evaluation: No evidence of DVT seen on physical exam.   Recent Labs  02/08/17 0255  HGB 10.4*  HCT 30.1*    Assessment/Plan: Plan for discharge tomorrow   LOS: 2 days   Allie BossierMyra C Tanai Bouler 02/10/2017, 6:45 AM

## 2017-02-10 NOTE — Lactation Note (Signed)
This note was copied from a baby's chart. Lactation Consultation Note  Patient Name: Girl Elenor Quinonesubreona Strahm EAVWU'JToday's Date: 02/10/2017 Reason for consult: Follow-up assessment Baby at 41 hr of life. Upon entry mom reports she bottle fed 10 ml of expressed milk. She reports bilateral nipple soreness. The L nipple appears normal, the R nipple has what appears to be a healing blister. Given comfort gels. Mom stated the pump is pinching. She is using the #24 flanges and pumping on level 3. Moved her up to #27 flange and instructed on coconut oil. Discussed LPT baby behavior, feeding frequency, pumping, supplementing volume guidelines, baby belly size, voids, wt loss, breast changes, and nipple care. Parents are aware of lactation services.  Mom will offer the breast q3hr on demand, post pump, and offer expressed milk per volume guidelines. If there is not enough expressed milk parents will offer formula.     Maternal Data    Feeding Feeding Type: Breast Fed Length of feed: 30 min  LATCH Score/Interventions Latch: Grasps breast easily, tongue down, lips flanged, rhythmical sucking. Intervention(s): Waking techniques;Teach feeding cues Intervention(s): Adjust position;Assist with latch  Audible Swallowing: Spontaneous and intermittent Intervention(s): Hand expression Intervention(s): Skin to skin  Type of Nipple: Everted at rest and after stimulation  Comfort (Breast/Nipple): Filling, red/small blisters or bruises, mild/mod discomfort  Problem noted: Mild/Moderate discomfort;Cracked, bleeding, blisters, bruises Interventions  (Cracked/bleeding/bruising/blister): Expressed breast milk to nipple Interventions (Mild/moderate discomfort): Comfort gels (coconut oil)  Hold (Positioning): Assistance needed to correctly position infant at breast and maintain latch. Intervention(s): Position options;Support Pillows  LATCH Score: 8  Lactation Tools Discussed/Used     Consult Status Consult  Status: Follow-up Date: 02/11/17 Follow-up type: In-patient    Rulon Eisenmengerlizabeth E Rowyn Spilde 02/10/2017, 5:58 PM

## 2017-02-11 MED ORDER — IBUPROFEN 600 MG PO TABS: 600.0000 mg | ORAL_TABLET | Freq: Four times a day (QID) | ORAL | 0 refills | Status: DC

## 2017-02-11 NOTE — Discharge Instructions (Signed)

## 2017-02-11 NOTE — Lactation Note (Addendum)
This note was copied from a baby's chart. Lactation Consultation Note  Baby 3057 hours old.  Mother has been breastfeeding, pumping and supplementing. Nipple blisters have mostly healed. Mother easily expressed flow of breastmilk. She the latched baby in football hold.   Sucks and swallows observed. Discussed post pumping approx 4-5 times a day and give volume back to baby. Mother pumped approx 9 ml this morning which she will give to baby after feeding. Discussed milk storage. Mom encouraged to feed baby 8-12 times/24 hours and with feeding cues.  Reviewed engorgement care and monitoring voids/stools. Provided mother w/ DEBP rental pump  Patient Name: [name] [id number]'BToday's Date: 02/11/2017     Maternal Data    Feeding    LATCH Score/Interventions                      Lactation Tools Discussed/Used     Consult Status      Adriana [name], Adriana [name] 02/11/2017, 10:14 AM

## 2017-02-11 NOTE — Discharge Summary (Signed)
OB Discharge Summary  Patient Name: Adriana [name] DOB: 2004/08/27 MRN: [id number]  Date of admission: 02/08/2017 Delivering MD: Wendee Beavers   Date of discharge: 02/11/2017  Admitting diagnosis: 36 WEEKS ROM Intrauterine pregnancy: [redacted]w[redacted]d     Secondary diagnosis:Active Problems:   Preterm premature rupture of membranes (PPROM) with unknown onset of labor     Discharge diagnosis: Term Pregnancy Delivered, anemia Hbg 10.3                                                                Post partum procedures:none  Augmentation: Cytotec and BMZ due to PPROM  Complications: None  Hospital course:  21 y.o. yo G1P0101 at [redacted]w[redacted]d was admitted with PPROM on 02/08/2017. Patient had an uncomplicated labor course as follows:  Membrane Rupture Time/Date: 1:30 AM ,02/08/2017   Intrapartum Procedures: Episiotomy: None [1]                                         Lacerations:  None [1]  Patient had a delivery of a Viable infant. 02/09/2017  Information for the patient's newborn:  Yevonne, [employer]ok[employer] Girl Reginae [[id number]]       Pateint had an uncomplicated postpartum course.  She is ambulating, tolerating a regular diet, passing flatus, and urinating well. Patient is discharged home in stable condition on 02/11/17.   Physical exam  Vitals:   02/09/17 1740 02/10/17 0642 02/10/17 1836 02/11/17 0542  BP: 111/63 117/65 133/78 123/65  Pulse: 80 72 78 74  Resp: 18 20 18 18   Temp: 98 F (36.7 C) 98.2 F (36.8 C) 97.6 F (36.4 C) 98 F (36.7 C)  TempSrc: Oral Oral Oral Oral  SpO2:      Weight:      Height:       General: alert Lochia: appropriate Uterine Fundus: firm and NT at U Incision: N/A DVT Evaluation: No evidence of DVT seen on physical exam. Labs: Lab Results  Component Value Date   WBC 8.6 02/08/2017   HGB 10.4 (L) 02/08/2017   HCT 30.1 (L) 02/08/2017   MCV 97.4 02/08/2017   PLT 281 02/08/2017   No flowsheet data found.  Discharge instruction: per After Visit Summary  and "Baby and Me Booklet".  After Visit Meds:  Allergies as of 02/11/2017      Reactions   Imitrex [sumatriptan] Other (See Comments)   Chest pain, heaviness, migraine   Latex Rash      Medication List    STOP taking these medications   amoxicillin 500 MG capsule Commonly known as:  AMOXIL   amoxicillin-clavulanate 875-125 MG tablet Commonly known as:  AUGMENTIN   IRON PO     TAKE these medications   acetaminophen 500 MG chewable tablet Commonly known as:  TYLENOL Chew 1,000 mg by mouth every 6 (six) hours as needed for pain.   Breast Pump Misc Dispense one breast pump for patient   cyclobenzaprine 5 MG tablet Commonly known as:  FLEXERIL Take 1 tablet (5 mg total) by mouth every 8 (eight) hours as needed for muscle spasms.   docusate sodium 100 MG capsule Commonly known as:  COLACE Take 1 capsule (  100 mg total) by mouth 2 (two) times daily.   ibuprofen 600 MG tablet Commonly known as:  ADVIL,MOTRIN Take 1 tablet (600 mg total) by mouth every 6 (six) hours.   prenatal multivitamin Tabs tablet Take 1 tablet by mouth daily at 12 noon.       Diet: routine diet  Activity: Advance as tolerated. Pelvic rest for 6 weeks.   Outpatient follow up:6 weeks Follow up Appt:No future appointments. Follow up visit: No Follow-up on file.  Postpartum contraception: None, planning depo provera at her postpartum visit and doesn't want it today.  Newborn Data: Live born female  Birth Weight: 6 lb 6.8 oz (2915 g) APGAR: 8, 9  Baby Feeding: Breast Disposition:home with mother   02/11/2017 Allie BossierMyra C Trinidad Ingle, MD

## 2017-03-23 ENCOUNTER — Ambulatory Visit: Payer: Medicaid Other | Admitting: Certified Nurse Midwife

## 2017-03-30 ENCOUNTER — Ambulatory Visit: Payer: Medicaid Other | Admitting: Certified Nurse Midwife

## 2017-05-01 ENCOUNTER — Encounter: Payer: Self-pay | Admitting: Obstetrics

## 2017-05-01 ENCOUNTER — Other Ambulatory Visit (HOSPITAL_COMMUNITY)
Admission: RE | Admit: 2017-05-01 | Discharge: 2017-05-01 | Disposition: A | Discharge status: Home / Self Care | Payer: Medicaid Other | Source: Ambulatory Visit | Attending: Obstetrics | Admitting: Obstetrics

## 2017-05-01 ENCOUNTER — Ambulatory Visit (INDEPENDENT_AMBULATORY_CARE_PROVIDER_SITE_OTHER): Payer: Medicaid Other | Admitting: Obstetrics

## 2017-05-01 DIAGNOSIS — B9689 Other specified bacterial agents as the cause of diseases classified elsewhere: Secondary | ICD-10-CM | POA: Insufficient documentation

## 2017-05-01 DIAGNOSIS — Z3202 Encounter for pregnancy test, result negative: Secondary | ICD-10-CM

## 2017-05-01 DIAGNOSIS — N898 Other specified noninflammatory disorders of vagina: Secondary | ICD-10-CM

## 2017-05-01 DIAGNOSIS — J039 Acute tonsillitis, unspecified: Secondary | ICD-10-CM

## 2017-05-01 DIAGNOSIS — Z3009 Encounter for other general counseling and advice on contraception: Secondary | ICD-10-CM

## 2017-05-01 DIAGNOSIS — N760 Acute vaginitis: Secondary | ICD-10-CM | POA: Diagnosis not present

## 2017-05-01 DIAGNOSIS — Z30013 Encounter for initial prescription of injectable contraceptive: Secondary | ICD-10-CM

## 2017-05-01 LAB — POCT URINE PREGNANCY: Preg Test, Ur: NEGATIVE

## 2017-05-01 MED ORDER — MEDROXYPROGESTERONE ACETATE 150 MG/ML IM SUSP: 150.0000 mg | INTRAMUSCULAR | 0 refills | Status: DC

## 2017-05-01 NOTE — Progress Notes (Signed)
Subjective:     Adriana [name] is a 21 y.o. female who presents for a postpartum visit. She is 10 weeks postpartum following a spontaneous vaginal delivery. I have fully reviewed the prenatal and intrapartum course. The delivery was at 36 gestational weeks. Outcome: spontaneous vaginal delivery. Anesthesia: epidural. Postpartum course has been UNREMARKABLE. Baby's course has been UNREMARKABLE. Baby is feeding by breast. Bleeding no bleeding. Bowel function is normal. Bladder function is normal. Patient is sexually active. Contraception method is none. Postpartum depression screening: negative.  Complains of a labial laceration that was not repaired, and is overlapping her introitus, and causing pain with intercourse.  The following portions of the patient's history were reviewed and updated as appropriate: allergies, current medications, past family history, past medical history, past social history, past surgical history and problem list.  Review of Systems A comprehensive review of systems was negative.   Objective:    BP 112/73   Pulse 76   Wt 153 lb (69.4 kg)   Breastfeeding? Yes   BMI 23.96 kg/m    General:  alert and no distress   Breasts:  inspection negative, no nipple discharge or bleeding, no masses or nodularity palpable  Lungs: clear to auscultation bilaterally  Heart:  regular rate and rhythm, S1, S2 normal, no murmur, click, rub or gallop  Abdomen: soft, non-tender; bowel sounds normal; no masses,  no organomegaly   Vulva:  floppy right labia majora  Vagina: normal vagina, no discharge, exudate, lesion, or erythema  Cervix:  no cervical motion tenderness  Corpus: normal size, contour, position, consistency, mobility, non-tender  Adnexa:  no mass, fullness, tenderness  Rectal Exam: Not performed.        Assessment:     Normal postpartum exam. Pap smear not done at today's visit.   Contraceptive Counseling and Advice.  Wants Depo Provera but has had unprotected intercourse  over the past week  Labial laceration, poorly healed anatomically.  May need a labioplasty in the future.  Plan:    1. Contraception: Depo-Provera injections 2. Depo Provera Rx, to be given in 2 weeks with abstinence  3. Follow up in 2: years for pap smear or as needed.

## 2017-05-02 LAB — CERVICOVAGINAL ANCILLARY ONLY
BACTERIAL VAGINITIS: POSITIVE — AB
Candida vaginitis: NEGATIVE
Chlamydia: NEGATIVE
Neisseria Gonorrhea: NEGATIVE
TRICH (WINDOWPATH): NEGATIVE

## 2017-05-03 ENCOUNTER — Other Ambulatory Visit: Payer: Self-pay | Admitting: Obstetrics

## 2017-05-03 DIAGNOSIS — N760 Acute vaginitis: Principal | ICD-10-CM

## 2017-05-03 DIAGNOSIS — B9689 Other specified bacterial agents as the cause of diseases classified elsewhere: Secondary | ICD-10-CM

## 2017-05-03 MED ORDER — METRONIDAZOLE 500 MG PO TABS: 500.0000 mg | ORAL_TABLET | Freq: Two times a day (BID) | ORAL | 2 refills | Status: DC

## 2017-05-16 ENCOUNTER — Ambulatory Visit (INDEPENDENT_AMBULATORY_CARE_PROVIDER_SITE_OTHER): Payer: Medicaid Other

## 2017-05-16 VITALS — BP 113/60 | HR 85 | Wt 151.7 lb

## 2017-05-16 DIAGNOSIS — Z3009 Encounter for other general counseling and advice on contraception: Secondary | ICD-10-CM

## 2017-05-16 DIAGNOSIS — Z30013 Encounter for initial prescription of injectable contraceptive: Secondary | ICD-10-CM | POA: Diagnosis not present

## 2017-05-16 MED ORDER — MEDROXYPROGESTERONE ACETATE 150 MG/ML IM SUSP: 150.0000 mg | INTRAMUSCULAR | 3 refills | Status: DC

## 2017-05-16 MED ORDER — MEDROXYPROGESTERONE ACETATE 150 MG/ML IM SUSP
150.0000 mg | Freq: Once | INTRAMUSCULAR | Status: AC
  Administered 2017-05-16: 150 mg via INTRAMUSCULAR

## 2017-05-16 NOTE — Progress Notes (Signed)
Patient wants Rx for Yeast. Presents for DEPO. UPT on 05/01/17 is Negative. Patient denies having sex.  Given in Right Deltoid. Tolerated well. Next DEPO 9/3-17/2018  Administrations This Visit    medroxyPROGESTERone (DEPO-PROVERA) injection 150 mg    Admin Date 05/16/2017 Action Given Dose 150 mg Route Intramuscular Administered By Maretta BeesMcGlashan, Carol J, RMA

## 2017-05-16 NOTE — Addendum Note (Signed)
Addended by: Maretta BeesMCGLASHAN, Allegra Cerniglia J on: 05/16/2017 11:58 AM   Modules accepted: Orders

## 2017-08-01 ENCOUNTER — Ambulatory Visit: Payer: Medicaid Other

## 2017-08-03 ENCOUNTER — Ambulatory Visit: Payer: Medicaid Other

## 2017-08-10 ENCOUNTER — Ambulatory Visit (INDEPENDENT_AMBULATORY_CARE_PROVIDER_SITE_OTHER): Payer: Medicaid Other | Admitting: Certified Nurse Midwife

## 2017-08-10 ENCOUNTER — Encounter: Payer: Self-pay | Admitting: Certified Nurse Midwife

## 2017-08-10 VITALS — BP 108/73 | HR 83 | Ht 66.0 in | Wt 143.0 lb

## 2017-08-10 DIAGNOSIS — Z3009 Encounter for other general counseling and advice on contraception: Secondary | ICD-10-CM

## 2017-08-10 DIAGNOSIS — Z3042 Encounter for surveillance of injectable contraceptive: Secondary | ICD-10-CM | POA: Diagnosis not present

## 2017-08-10 MED ORDER — MEDROXYPROGESTERONE ACETATE 150 MG/ML IM SUSP: 150.0000 mg | INTRAMUSCULAR | 0 refills | Status: DC

## 2017-08-10 MED ORDER — MEDROXYPROGESTERONE ACETATE 150 MG/ML IM SUSP
150.0000 mg | Freq: Once | INTRAMUSCULAR | Status: AC
  Administered 2017-08-10: 150 mg via INTRAMUSCULAR

## 2017-08-10 NOTE — Progress Notes (Signed)
[facility], pt is currently on depo but states that she is tired of bleeding and wants to discuss some other options.

## 2017-08-11 ENCOUNTER — Encounter: Payer: Self-pay | Admitting: Certified Nurse Midwife

## 2017-08-11 NOTE — Progress Notes (Signed)
Subjective:    Adriana [name] is a 21 y.o. female who presents for contraception counseling. The patient has no complaints today. The patient is sexually active. Pertinent past medical history: none.  Had one dose of Depo and had spotting/period like bleeding afterwards that comes and goes.  Discussed normal bleeding with Depo injections.  Declines LARK including Nexplanon/IUDs. "does not want anything inside of her".  Discussed Pills and she is not good at taking a pill every day.    The information documented in the HPI was reviewed and verified. Reviewed all forms of birth control options available including abstinence; over the counter/barrier methods; hormonal contraceptive medication including pill, patch, ring, injection,contraceptive implant; hormonal and nonhormonal IUDs; permanent sterilization options including vasectomy and the various tubal sterilization modalities. Risks and benefits reviewed.  Questions were answered.  Information was given to patient to review.    Menstrual History: OB History    Gravida Para Term Preterm AB Living   SAB TAB Ectopic Multiple Live Births         0 1       No LMP recorded. Patient has had an injection.   Patient Active Problem List   Diagnosis Date Noted  . Migraine headache with aura 04/16/2014   Past Medical History:  Diagnosis Date  . Migraines     Past Surgical History:  Procedure Laterality Date  . HERNIA REPAIR     as an infant     Current Outpatient Prescriptions:  .  medroxyPROGESTERone (DEPO-PROVERA) 150 MG/ML injection, Inject 1 mL (150 mg total) into the muscle every 3 (three) months., Disp: 1 mL, Rfl: 3 .  medroxyPROGESTERone (DEPO-PROVERA) 150 MG/ML injection, Inject 1 mL (150 mg total) into the muscle every 3 (three) months., Disp: 1 mL, Rfl: 0 .  metroNIDAZOLE (FLAGYL) 500 MG tablet, Take 1 tablet (500 mg total) by mouth 2 (two) times daily., Disp: 14 tablet, Rfl: 2 Allergies  Allergen Reactions  .  Imitrex [Sumatriptan] Other (See Comments)    Chest pain, heaviness, migraine  . Latex Rash    Social History  Substance Use Topics  . Smoking status: Former Smoker    Quit date: 06/30/2016  . Smokeless tobacco: Never Used  . Alcohol use No    Family History  Problem Relation Age of Onset  . Asthma Maternal Uncle        Review of Systems Constitutional: negative for weight loss Genitourinary:negative for abnormal menstrual periods and vaginal discharge    Objective:   BP 108/73   Pulse 83   Ht  (1.676 m)   Wt 143 lb (64.9 kg)   Breastfeeding? No   BMI 23.08 kg/m   PE: deferred General:   alert   Lab Review Urine pregnancy test Labs reviewed yes Radiologic studies reviewed no  100% of 15 min visit spent on counseling and coordination of care.    Assessment:    21 y.o., continuing Depo-Provera injections, no contraindications.   Plan:    All questions answered.  Meds ordered this encounter  Medications  . medroxyPROGESTERone (DEPO-PROVERA) 150 MG/ML injection    Sig: Inject 1 mL (150 mg total) into the muscle every 3 (three) months.    Dispense:  1 mL    Refill:  0  . medroxyPROGESTERone (DEPO-PROVERA) injection 150 mg

## 2017-08-23 ENCOUNTER — Ambulatory Visit: Payer: Medicaid Other | Admitting: Certified Nurse Midwife

## 2017-10-31 ENCOUNTER — Encounter: Payer: Self-pay | Admitting: Certified Nurse Midwife

## 2017-10-31 ENCOUNTER — Ambulatory Visit (INDEPENDENT_AMBULATORY_CARE_PROVIDER_SITE_OTHER): Payer: Medicaid Other | Admitting: Certified Nurse Midwife

## 2017-10-31 ENCOUNTER — Ambulatory Visit: Payer: Medicaid Other

## 2017-10-31 VITALS — BP 115/77 | HR 93 | Ht 65.0 in | Wt 139.4 lb

## 2017-10-31 DIAGNOSIS — N921 Excessive and frequent menstruation with irregular cycle: Secondary | ICD-10-CM | POA: Diagnosis not present

## 2017-10-31 MED ORDER — MEDROXYPROGESTERONE ACETATE 10 MG PO TABS: 20.0000 mg | ORAL_TABLET | Freq: Every day | ORAL | 0 refills | Status: DC

## 2017-10-31 NOTE — Progress Notes (Signed)
Patient is currently on Depo and reports [facility]. Pt is currently breastfeeding.

## 2017-10-31 NOTE — Progress Notes (Signed)
Subjective:    Adriana [name] is a 21 y.o. female who presents for contraception counseling. The patient has no complaints today. The patient is sexually active. Pertinent past medical history: none.  Started bleeding on October 26th to November 11th, stopped bleeding until November 23rd and is still bleeding.  Reports Delone/pink spotting bleeding, sometimes heavy then light.  Does use tampons.  Is currently breastfeeding.    The information documented in the HPI was reviewed and verified.  Menstrual History: OB History    Gravida Para Term Preterm AB Living   1 1   1   1    SAB TAB Ectopic Multiple Live Births         0 1       No LMP recorded. Patient has had an injection.   Patient Active Problem List   Diagnosis Date Noted  . Migraine headache with aura 04/16/2014   Past Medical History:  Diagnosis Date  . Migraines     Past Surgical History:  Procedure Laterality Date  . HERNIA REPAIR     as an infant     Current Outpatient Medications:  .  medroxyPROGESTERone (DEPO-PROVERA) 150 MG/ML injection, Inject 1 mL (150 mg total) into the muscle every 3 (three) months. (Patient not taking: Reported on 10/31/2017), Disp: 1 mL, Rfl: 3 .  medroxyPROGESTERone (DEPO-PROVERA) 150 MG/ML injection, Inject 1 mL (150 mg total) into the muscle every 3 (three) months. (Patient not taking: Reported on 10/31/2017), Disp: 1 mL, Rfl: 0 .  medroxyPROGESTERone (PROVERA) 10 MG tablet, Take 2 tablets (20 mg total) by mouth daily., Disp: 60 tablet, Rfl: 0 .  metroNIDAZOLE (FLAGYL) 500 MG tablet, Take 1 tablet (500 mg total) by mouth 2 (two) times daily. (Patient not taking: Reported on 10/31/2017), Disp: 14 tablet, Rfl: 2 Allergies  Allergen Reactions  . Imitrex [Sumatriptan] Other (See Comments)    Chest pain, heaviness, migraine  . Latex Rash    Social History   Tobacco Use  . Smoking status: Former Smoker    Last attempt to quit: 06/30/2016    Years since quitting: 1.3  . Smokeless tobacco:  Never Used  Substance Use Topics  . Alcohol use: No    Alcohol/week: 0.0 oz    Family History  Problem Relation Age of Onset  . Asthma Maternal Uncle      Reviewed all forms of birth control options available including abstinence; over the counter/barrier methods; hormonal contraceptive medication including pill, patch, ring, injection,contraceptive implant; hormonal and nonhormonal IUDs; permanent sterilization options including vasectomy and the various tubal sterilization modalities. Risks and benefits reviewed.  Questions were answered.  Information was given to patient to review.    Review of Systems Constitutional: negative for weight loss Genitourinary:negative for abnormal menstrual periods and vaginal discharge      Objective:   BP 115/77   Pulse 93   Ht 5\' 5"  (1.651 m)   Wt 139 lb 6.4 oz (63.2 kg)   Breastfeeding? Yes   BMI 23.20 kg/m     Lab Review Urine pregnancy test Labs reviewed yes Radiologic studies reviewed no  50% of 20 min visit spent on counseling and coordination of care.    Assessment:    21 y.o., continuing Depo-Provera injections, no contraindications.   Plan:      Provera added for extra control of bleeding.    All questions answered. Meds ordered this encounter  Medications  . medroxyPROGESTERone (PROVERA) 10 MG tablet    Sig: Take 2 tablets (20  mg total) by mouth daily.    Dispense:  60 tablet    Refill:  0   Follow up as needed.

## 2017-11-02 ENCOUNTER — Ambulatory Visit (INDEPENDENT_AMBULATORY_CARE_PROVIDER_SITE_OTHER): Payer: Medicaid Other

## 2017-11-02 DIAGNOSIS — Z3042 Encounter for surveillance of injectable contraceptive: Secondary | ICD-10-CM

## 2017-11-02 MED ORDER — MEDROXYPROGESTERONE ACETATE 150 MG/ML IM SUSP
150.0000 mg | Freq: Once | INTRAMUSCULAR | Status: AC
  Administered 2017-11-02: 150 mg via INTRAMUSCULAR

## 2017-11-02 NOTE — Progress Notes (Signed)
I have reviewed this chart and agree with the RN/CMA assessment and management.    K. Meryl Yatzil Clippinger, M.D. Attending Obstetrician & Gynecologist, [facility], [facility]  

## 2017-11-02 NOTE — Progress Notes (Signed)
Pt supplied Depo given R del w/o difficulty. Pt is on time for inj. Next Depo due 2/21-3/7 pt agrees.

## 2017-12-14 ENCOUNTER — Other Ambulatory Visit (HOSPITAL_COMMUNITY)
Admission: RE | Admit: 2017-12-14 | Discharge: 2017-12-14 | Disposition: A | Discharge status: Home / Self Care | Payer: Medicaid Other | Source: Ambulatory Visit | Attending: Certified Nurse Midwife | Admitting: Certified Nurse Midwife

## 2017-12-14 ENCOUNTER — Encounter: Payer: Self-pay | Admitting: Certified Nurse Midwife

## 2017-12-14 ENCOUNTER — Ambulatory Visit (INDEPENDENT_AMBULATORY_CARE_PROVIDER_SITE_OTHER): Payer: Medicaid Other | Admitting: Certified Nurse Midwife

## 2017-12-14 VITALS — BP 123/70 | HR 73 | Wt 138.8 lb

## 2017-12-14 DIAGNOSIS — N760 Acute vaginitis: Secondary | ICD-10-CM

## 2017-12-15 LAB — CERVICOVAGINAL ANCILLARY ONLY
Bacterial vaginitis: NEGATIVE
CHLAMYDIA, DNA PROBE: NEGATIVE
Candida vaginitis: NEGATIVE
NEISSERIA GONORRHEA: NEGATIVE
Trichomonas: NEGATIVE

## 2017-12-15 NOTE — Progress Notes (Signed)
Patient ID: Adriana [name], female   DOB: [birth year], 21 y.o.   MRN: [id number]  No chief complaint on file.   HPI Adriana [name] is a 22 y.o. female.  Here for change in vaginal discharge.  Is currently sexually active. Is on Depo provera injections, tolerating them well.  States that she has a change in her discharge X1 week.  Reports chunky white vaginal discharge with itching.    HPI  Past Medical History:  Diagnosis Date  . Migraines     Past Surgical History:  Procedure Laterality Date  . HERNIA REPAIR     as an infant    Family History  Problem Relation Age of Onset  . Asthma Maternal Uncle     Social History Social History   Tobacco Use  . Smoking status: Former Smoker    Last attempt to quit: 06/30/2016    Years since quitting: 1.4  . Smokeless tobacco: Never Used  Substance Use Topics  . Alcohol use: No    Alcohol/week: 0.0 oz  . Drug use: No    Allergies  Allergen Reactions  . Imitrex [Sumatriptan] Other (See Comments)    Chest pain, heaviness, migraine  . Latex Rash    Current Outpatient Medications  Medication Sig Dispense Refill  . medroxyPROGESTERone (DEPO-PROVERA) 150 MG/ML injection Inject 150 mg into the muscle every 3 (three) months.    . Prenatal Vit-Fe Fumarate-FA (MULTIVITAMIN-PRENATAL) 27-0.8 MG TABS tablet Take 1 tablet by mouth daily at 12 noon.     No current facility-administered medications for this visit.     Review of Systems Review of Systems Constitutional: negative for fatigue and weight loss Respiratory: negative for cough and wheezing Cardiovascular: negative for chest pain, fatigue and palpitations Gastrointestinal: negative for abdominal pain and change in bowel habits Genitourinary:negative Integument/breast: negative for nipple discharge Musculoskeletal:negative for myalgias Neurological: negative for gait problems and tremors Behavioral/Psych: negative for abusive relationship, depression Endocrine: negative for  temperature intolerance      Blood pressure 123/70, pulse 73, weight 138 lb 12.8 oz (63 kg), currently breastfeeding.  Physical Exam Physical Exam General:   alert  Skin:   no rash or abnormalities  Lungs:   clear to auscultation bilaterally  Heart:   regular rate and rhythm, S1, S2 normal, no murmur, click, rub or gallop  Breasts:   deferred  Abdomen:  normal findings: no organomegaly, soft, non-tender and no hernia  Pelvis:  External genitalia: normal general appearance Urinary system: urethral meatus normal and bladder without fullness, nontender Vaginal: normal without tenderness, induration or masses Cervix: no CMT Adnexa: normal bimanual exam Uterus: anteverted and non-tender, normal size    50% of 15 min visit spent on counseling and coordination of care.   Data Reviewed previous medical hx, meds, labs  Assessment     1. Acute vaginitis    - Cervicovaginal ancillary only     Plan   Swab.   High risk sexual behaviors discussed.    Possible management options include:  LARK Follow up as needed.

## 2018-01-25 ENCOUNTER — Ambulatory Visit: Payer: Medicaid Other

## 2018-01-25 ENCOUNTER — Encounter: Payer: Self-pay | Admitting: Certified Nurse Midwife

## 2018-01-25 ENCOUNTER — Ambulatory Visit (INDEPENDENT_AMBULATORY_CARE_PROVIDER_SITE_OTHER): Payer: Medicaid Other | Admitting: Certified Nurse Midwife

## 2018-01-25 VITALS — BP 133/84 | HR 103 | Wt 137.2 lb

## 2018-01-25 DIAGNOSIS — N9489 Other specified conditions associated with female genital organs and menstrual cycle: Secondary | ICD-10-CM

## 2018-01-25 DIAGNOSIS — L987 Excessive and redundant skin and subcutaneous tissue: Secondary | ICD-10-CM

## 2018-01-25 DIAGNOSIS — N939 Abnormal uterine and vaginal bleeding, unspecified: Secondary | ICD-10-CM | POA: Diagnosis not present

## 2018-01-25 MED ORDER — MEDROXYPROGESTERONE ACETATE 10 MG PO TABS: ORAL_TABLET | ORAL | 0 refills | Status: DC

## 2018-01-25 NOTE — Progress Notes (Signed)
Patient ID: Adriana [name], female   DOB: [birth year], 21 y.o.   MRN: [id number]  Chief Complaint  Patient presents with  . Gynecologic Exam    HPI Adriana [name] is a 22 y.o. female.  Here for removal of extra labial tissue that has bothered her since delivery and is making it hard to have sexual intercourse.  States that she thinks it will tear off.  Reports heavy vaginal bleeding since Depo provera injection.  Declines Depo injection today.    Post-procedural labial skin removal instructions given.     HPI  Past Medical History:  Diagnosis Date  . Migraines     Past Surgical History:  Procedure Laterality Date  . HERNIA REPAIR     as an infant    Family History  Problem Relation Age of Onset  . Asthma Maternal Uncle     Social History Social History   Tobacco Use  . Smoking status: Former Smoker    Last attempt to quit: 06/30/2016    Years since quitting: 1.5  . Smokeless tobacco: Never Used  Substance Use Topics  . Alcohol use: No    Alcohol/week: 0.0 oz  . Drug use: No    Allergies  Allergen Reactions  . Imitrex [Sumatriptan] Other (See Comments)    Chest pain, heaviness, migraine  . Latex Rash    Current Outpatient Medications  Medication Sig Dispense Refill  . medroxyPROGESTERone (DEPO-PROVERA) 150 MG/ML injection Inject 150 mg into the muscle every 3 (three) months.    . Prenatal Vit-Fe Fumarate-FA (MULTIVITAMIN-PRENATAL) 27-0.8 MG TABS tablet Take 1 tablet by mouth daily at 12 noon.    . medroxyPROGESTERone (PROVERA) 10 MG tablet Take 2 pills three times a day for 7 days then two pills daily for 3 weeks. 84 tablet 0   No current facility-administered medications for this visit.     Review of Systems Review of Systems Constitutional: negative for fatigue and weight loss Respiratory: negative for cough and wheezing Cardiovascular: negative for chest pain, fatigue and palpitations Gastrointestinal: negative for abdominal pain and change in bowel  habits Genitourinary:n AUB with Depo injection.  Integument/breast: negative for nipple discharge Musculoskeletal:negative for myalgias Neurological: negative for gait problems and tremors Behavioral/Psych: negative for abusive relationship, depression Endocrine: negative for temperature intolerance      Blood pressure 133/84, pulse (!) 103, weight 137 lb 3.2 oz (62.2 kg), last menstrual period 12/11/2017, currently breastfeeding.  Physical Exam Physical Exam General:   alert  Skin:   no rash or abnormalities  Lungs:   clear to auscultation bilaterally  Heart:   regular rate and rhythm, S1, S2 normal, no murmur, click, rub or gallop  Breasts:   deferred  Abdomen:  normal findings: no organomegaly, soft, non-tender and no hernia  Pelvis:  External genitalia: normal general appearance, excess labial tissue lower right side removed see note below    Procedure Note Removal of excess labial tissue lower right labia.   Patient had vaginal delivery 02/09/17, did not have laceration noted on delivery note. Desires removal of excess tissue today due to issues with sexual intercourse and pain.    Reviewed risk and benefits of procedure. Alternative options discussed Patient reported understanding and agreed to continue.   The patient's right labia was palpated and the tissue identified. The area was prepped with Betadinex3. 1-2 cc of 1% lidocaine with epinephrine was injected. A 4 mm incision was made with a #10 scapel and excess tissue removed. # 4.0 vicryl used. Hemostatsis with very  minimal blood loss   The patient tolerated well.  Instructions:  The patient was instructed to perform sitz baths twice daile and that some bruising/tenderness is to be expected.  She was advised to use over the counter analgesics as needed for any pain at the site.  She is to keep the area dry and no sexual intercourse for the next two weeks.  Is using tampons for her period.    Return visit:  Return in 2  weeks Patient plans abstinence  Adriana [name] CNM      20% of 30 min visit spent on counseling and coordination of care.   Data Reviewed Previous medical hx, meds, labs  Assessment     Excess labial tissue removal AUB with Depo injections   1. Abnormal uterine bleeding (AUB)    - medroxyPROGESTERone (PROVERA) 10 MG tablet; Take 2 pills three times a day for 7 days then two pills daily for 3 weeks.  Dispense: 84 tablet; Refill: 0  2. Labial pain       3. Excessive skin and subcutaneous tissue     Removed.   4. Loose skin      Plan   Removal of excess tissue today in office  Meds ordered this encounter  Medications  . medroxyPROGESTERone (PROVERA) 10 MG tablet    Sig: Take 2 pills three times a day for 7 days then two pills daily for 3 weeks.    Dispense:  84 tablet    Refill:  0    Possible management options include: Nexplanon, IUD, Nuva Ring, Patch, OCPs

## 2018-02-01 IMAGING — US US MFM FETAL NUCHAL TRANSLUCENCY
1 series · 15 of 28 positions shown · non-contrast
Comparison: none

[Series 1: us mfm fetal nuchal translucency · 15 of 46 slices shown]
[im 1/46]
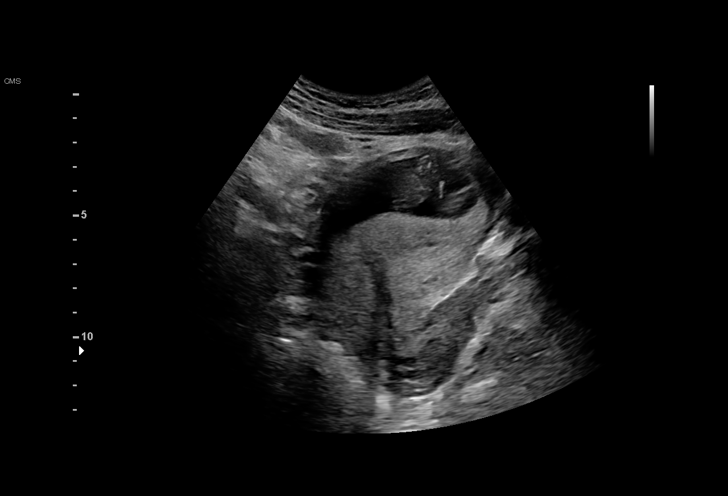
[im 4/46]
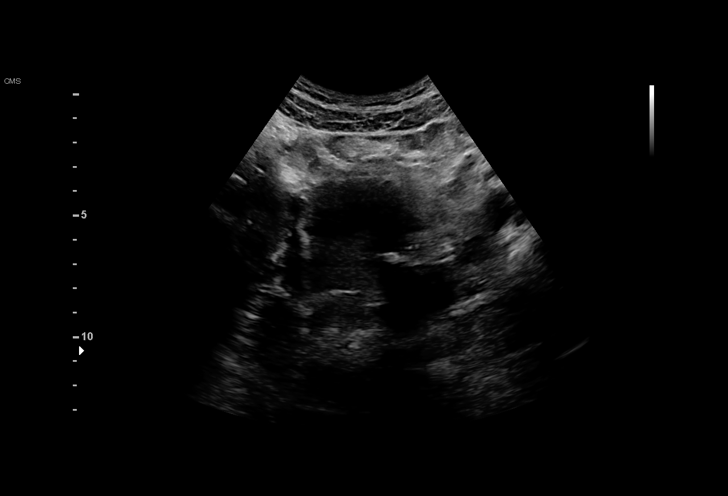
[im 7/46]
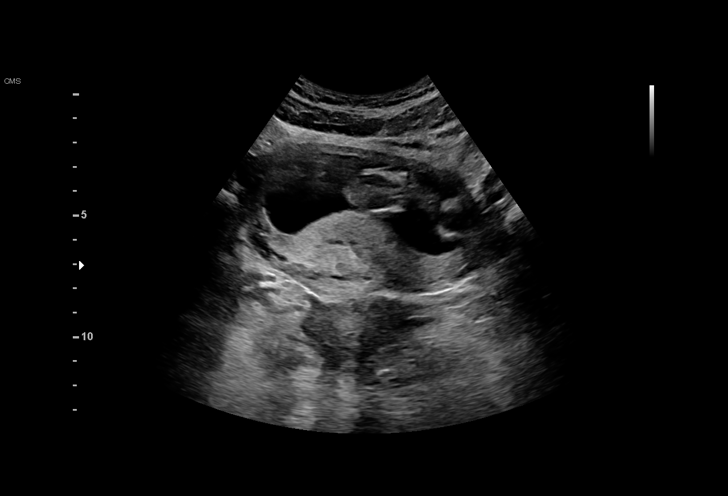
[im 11/46]
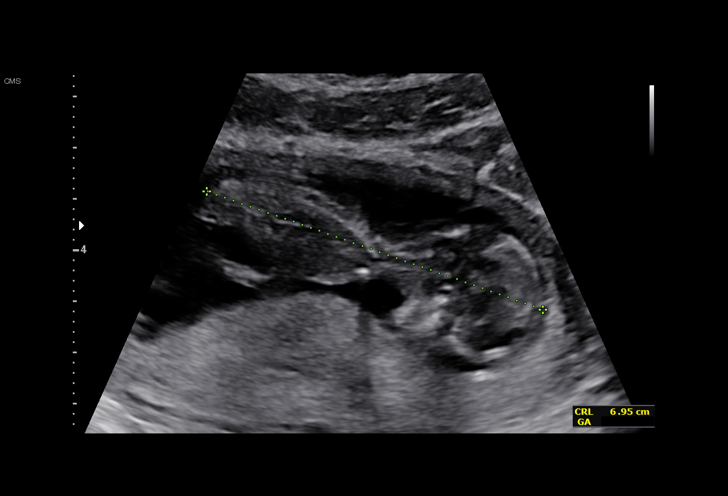
[im 14/46]
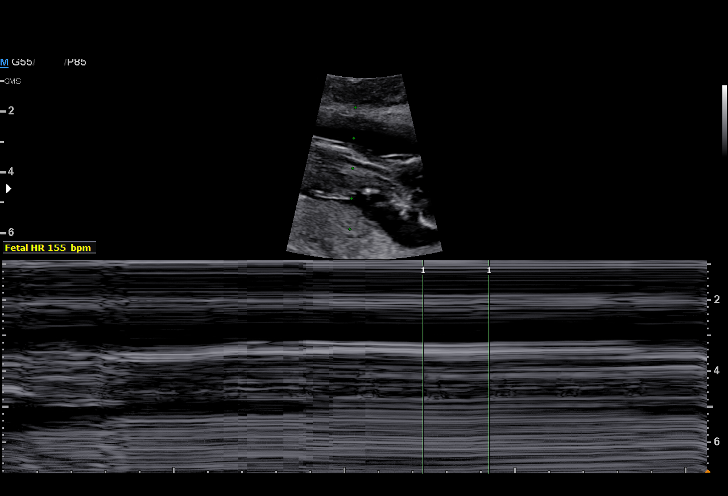
[im 17/46]
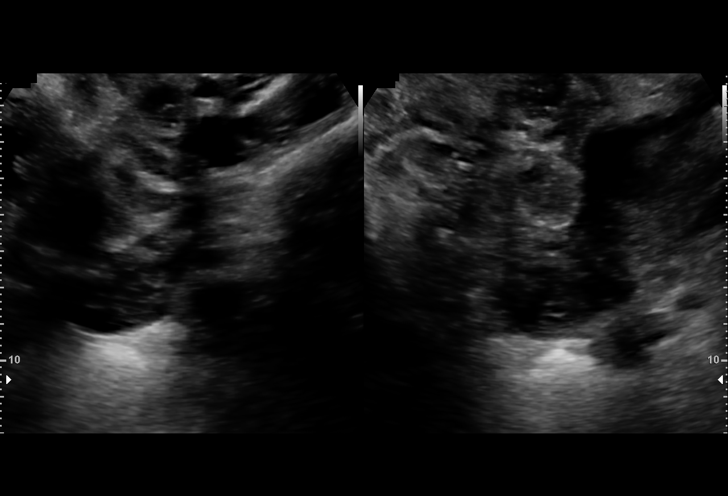
[im 21/46]
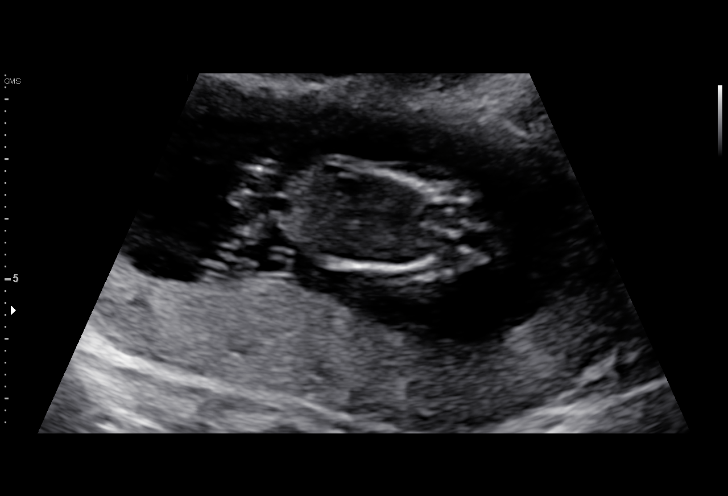
[im 24/46]
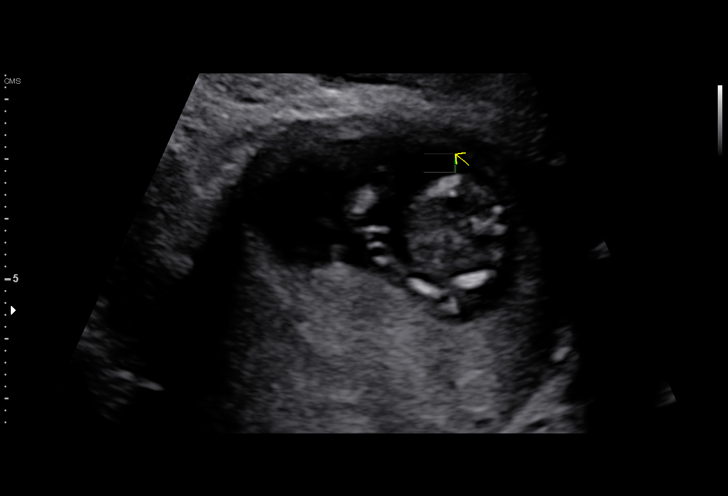
[im 26/46]
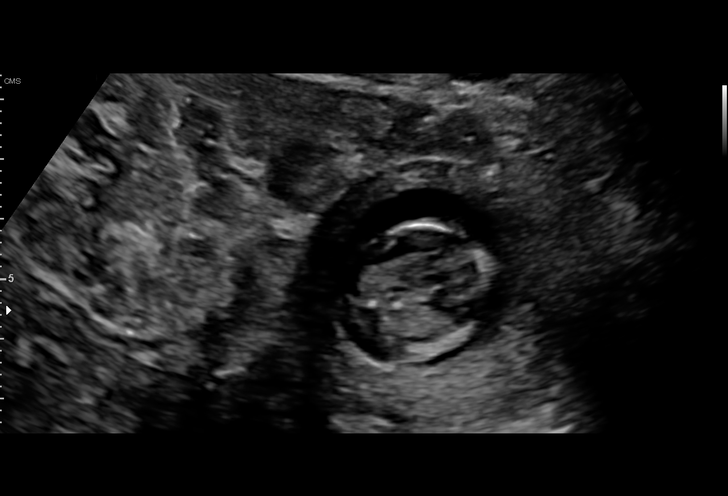
[im 29/46]
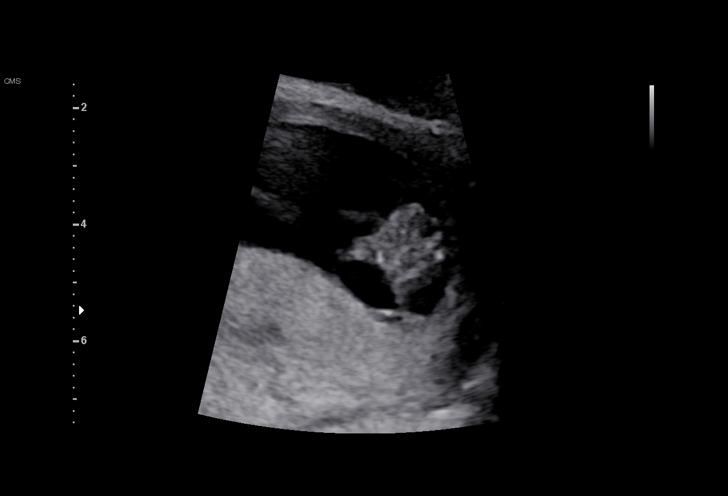
[im 32/46]
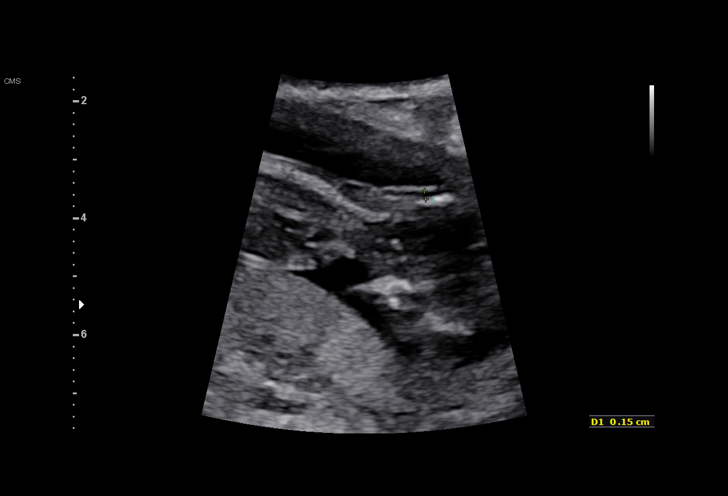
[im 36/46]
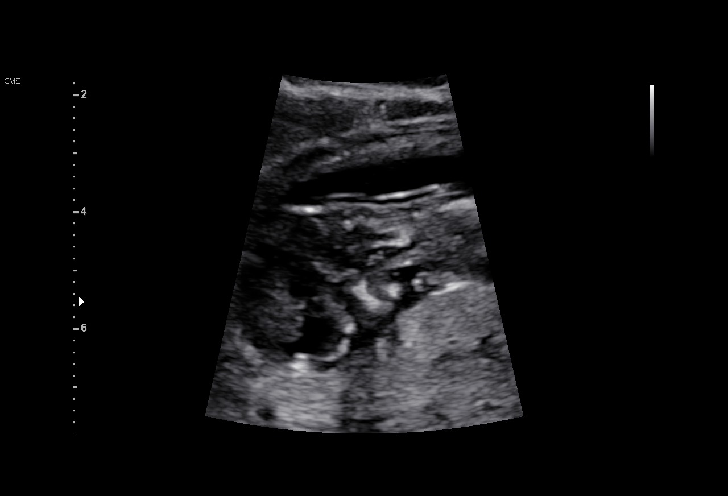
[im 39/46]
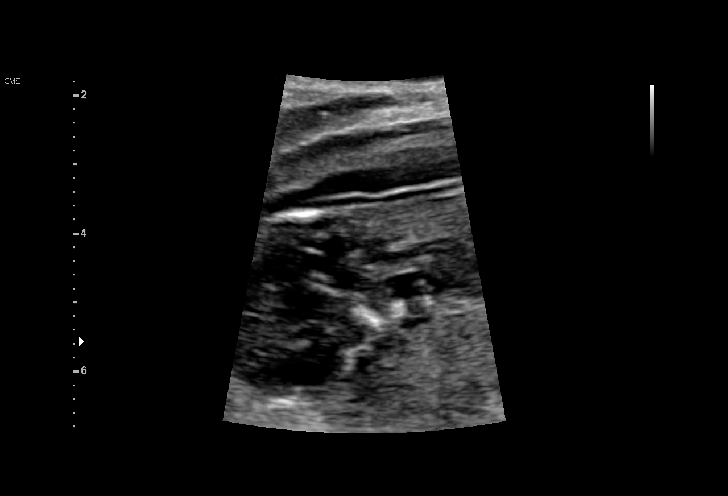
[im 42/46]
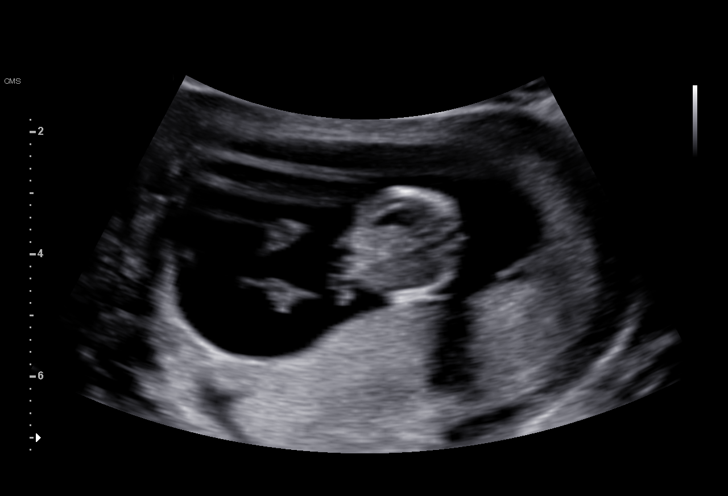
[im 46/46]
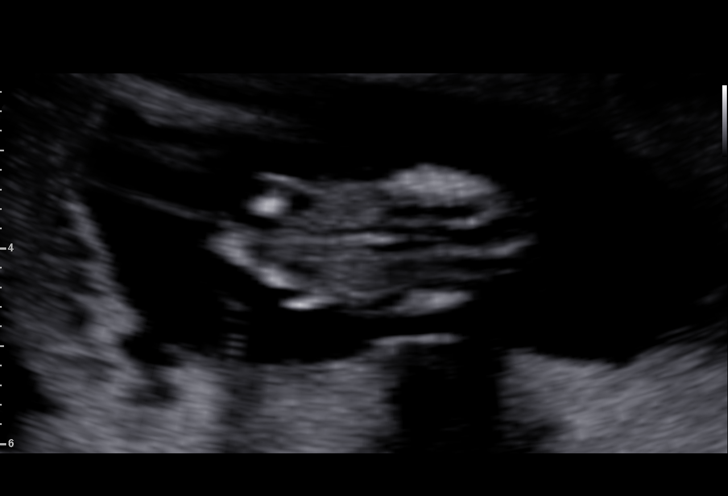

[15 of 28 positions shown; findings below may reference images not displayed]

TRANSLUCENCY

1  SADAYOSHI ASSON           [id number]      [id number]     [id number]
Indications

12 weeks gestation of pregnancy
First trimester aneuploidy screen (NT)         Z36
OB History

Gravidity:    1         Term:   0        Prem:   0        SAB:   0
TOP:          0       Ectopic:  0        Living: 0
Fetal Evaluation

Num Of Fetuses:     1
Preg. Location:     Intrauterine
Gest. Sac:          Intrauterine
Fetal Pole:         Visualized
Fetal Heart         155
Rate(bpm):
Cardiac Activity:   Observed
Biometry

CRL:      69.5  mm     G. Age:  13w 0d                  EDD:   03/01/17
Gestational Age

LMP:           12w 4d        Date:  05/28/16                 EDD:   03/04/17
Best:          12w 4d     Det. By:  Early Ultrasound         EDD:   03/04/17
(07/14/16)
1st Trimester Genetic Sonogram Screening
CRL:            69.5  mm    G. Age:   13w 0d                 EDD:   03/01/17
Nuc Trans:       1.5  mm
Nasal Bone:                 Not able to evaluate
Cervix Uterus Adnexa

Cervix
Closed.

Uterus
No abnormality visualized.

Left Ovary
Within normal limits.

Right Ovary
Within normal limits.

Cul De Sac:   No free fluid seen.

Adnexa:       No adnexal mass visualized.
Impression

SIUP at 12+4 weeks
No gross abnormalities identified
NT measurement was within normal limits for this GA
Normal amniotic fluid volume
Measurements consistent with early US
Recommendations

Offer MSAFP in the second trimester for ONTD screening
Offer anatomy U/S by 18 weeks

## 2018-02-07 ENCOUNTER — Encounter: Payer: Self-pay | Admitting: Certified Nurse Midwife

## 2018-02-07 ENCOUNTER — Ambulatory Visit (INDEPENDENT_AMBULATORY_CARE_PROVIDER_SITE_OTHER): Payer: Medicaid Other | Admitting: Certified Nurse Midwife

## 2018-02-07 VITALS — BP 123/71 | HR 85 | Wt 141.0 lb

## 2018-02-07 DIAGNOSIS — Z30011 Encounter for initial prescription of contraceptive pills: Secondary | ICD-10-CM | POA: Diagnosis not present

## 2018-02-07 MED ORDER — NORETHINDRONE 0.35 MG PO TABS: 1.0000 | ORAL_TABLET | Freq: Every day | ORAL | 4 refills | Status: DC

## 2018-02-07 NOTE — Progress Notes (Signed)
Patient ID: Adriana [name], female   DOB: [birth year], 21 y.o.   MRN: [id number]  Chief Complaint  Patient presents with  . Wound Check    Labial incision check    HPI Adriana [name] is a 22 y.o. female.  Here for incision check and AUB with depo injections.  Took provera for 4 days but it was giving her a HA.  Did not stop bleeding with the provera.  Is currently breast feeding about 4-5 times/day.  States that her labia is doing well.    HPI  Past Medical History:  Diagnosis Date  . Migraines     Past Surgical History:  Procedure Laterality Date  . HERNIA REPAIR     as an infant    Family History  Problem Relation Age of Onset  . Asthma Maternal Uncle     Social History Social History   Tobacco Use  . Smoking status: Former Smoker    Last attempt to quit: 06/30/2016    Years since quitting: 1.6  . Smokeless tobacco: Never Used  Substance Use Topics  . Alcohol use: No    Alcohol/week: 0.0 oz  . Drug use: No    Allergies  Allergen Reactions  . Imitrex [Sumatriptan] Other (See Comments)    Chest pain, heaviness, migraine  . Latex Rash    Current Outpatient Medications  Medication Sig Dispense Refill  . medroxyPROGESTERone (DEPO-PROVERA) 150 MG/ML injection Inject 150 mg into the muscle every 3 (three) months.    . medroxyPROGESTERone (PROVERA) 10 MG tablet Take 2 pills three times a day for 7 days then two pills daily for 3 weeks. (Patient not taking: Reported on 02/07/2018) 84 tablet 0  . norethindrone (MICRONOR,CAMILA,ERRIN) 0.35 MG tablet Take 1 tablet (0.35 mg total) by mouth daily. 1 Package 4  . Prenatal Vit-Fe Fumarate-FA (MULTIVITAMIN-PRENATAL) 27-0.8 MG TABS tablet Take 1 tablet by mouth daily at 12 noon.     No current facility-administered medications for this visit.     Review of Systems Review of Systems Constitutional: negative for fatigue and weight loss Respiratory: negative for cough and wheezing Cardiovascular: negative for chest pain,  fatigue and palpitations Gastrointestinal: negative for abdominal pain and change in bowel habits Genitourinary:negative Integument/breast: negative for nipple discharge Musculoskeletal:negative for myalgias Neurological: negative for gait problems and tremors Behavioral/Psych: negative for abusive relationship, depression Endocrine: negative for temperature intolerance      Blood pressure 123/71, pulse 85, weight 141 lb (64 kg), currently breastfeeding.  Physical Exam Physical Exam General:   alert  Skin:   no rash or abnormalities  Lungs:   clear to auscultation bilaterally  Heart:   regular rate and rhythm, S1, S2 normal, no murmur, click, rub or gallop  Breasts:   deferred  Abdomen:  normal findings: no organomegaly, soft, non-tender and no hernia  Pelvis:  External genitalia: normal general appearance, stiches healing.  Urinary system: urethral meatus normal and bladder without fullness, nontender Vaginal: normal without tenderness, induration or masses     50% of 15 min visit spent on counseling and coordination of care.   Data Reviewed Previous medical hx, meds  Assessment     AUB with Depo provera injections S/P labial excision: healing    Plan    Sitz baths for labial healing encouraged  Meds ordered this encounter  Medications  . norethindrone (MICRONOR,CAMILA,ERRIN) 0.35 MG tablet    Sig: Take 1 tablet (0.35 mg total) by mouth daily.    Dispense:  1 Package  Refill:  4   Possible management options include:IUD, OCPs Follow up as needed.

## 2018-02-07 NOTE — Progress Notes (Signed)
Presents for labial incision check.  She only took the Provera for 4 days.

## 2018-03-27 ENCOUNTER — Encounter: Payer: Self-pay | Admitting: Certified Nurse Midwife

## 2018-03-27 ENCOUNTER — Ambulatory Visit: Payer: Medicaid Other | Admitting: Certified Nurse Midwife

## 2018-03-27 ENCOUNTER — Ambulatory Visit (INDEPENDENT_AMBULATORY_CARE_PROVIDER_SITE_OTHER): Payer: Medicaid Other | Admitting: Certified Nurse Midwife

## 2018-03-27 VITALS — BP 126/75 | HR 84 | Wt 143.0 lb

## 2018-03-27 DIAGNOSIS — Z3041 Encounter for surveillance of contraceptive pills: Secondary | ICD-10-CM

## 2018-03-27 NOTE — Progress Notes (Signed)
Subjective:    Adriana [name] is a 22 y.o. female who presents for contraception counseling. The patient has no complaints today. The patient is not sexually active. Pertinent past medical history: none.  Has been having a period off an on for months now.  Will have a period for 5-7 days a week off and back on a cycle again.  She is tired of bleeding.  Has had one injection of Depo provera and then took extra provera to help stop the bleeding: that did not work.  Then switched to OCPs for the last 3 months. Is still having irregular bleeding with full periods.    The information documented in the HPI was reviewed and verified.  Menstrual History: OB History    Gravida  1   Para  1   Term      Preterm  1   AB      Living  1     SAB      TAB      Ectopic      Multiple  0   Live Births  1            No LMP recorded. Patient has had an injection.   Patient Active Problem List   Diagnosis Date Noted  . Migraine headache with aura 04/16/2014   Past Medical History:  Diagnosis Date  . Migraines     Past Surgical History:  Procedure Laterality Date  . HERNIA REPAIR     as an infant     Current Outpatient Medications:  .  norethindrone (MICRONOR,CAMILA,ERRIN) 0.35 MG tablet, Take 1 tablet (0.35 mg total) by mouth daily., Disp: 1 Package, Rfl: 4 .  medroxyPROGESTERone (DEPO-PROVERA) 150 MG/ML injection, Inject 150 mg into the muscle every 3 (three) months., Disp: , Rfl:  .  medroxyPROGESTERone (PROVERA) 10 MG tablet, Take 2 pills three times a day for 7 days then two pills daily for 3 weeks. (Patient not taking: Reported on 02/07/2018), Disp: 84 tablet, Rfl: 0 .  Prenatal Vit-Fe Fumarate-FA (MULTIVITAMIN-PRENATAL) 27-0.8 MG TABS tablet, Take 1 tablet by mouth daily at 12 noon., Disp: , Rfl:  Allergies  Allergen Reactions  . Imitrex [Sumatriptan] Other (See Comments)    Chest pain, heaviness, migraine  . Latex Rash    Social History   Tobacco Use  . Smoking  status: Former Smoker    Last attempt to quit: 06/30/2016    Years since quitting: 1.7  . Smokeless tobacco: Never Used  Substance Use Topics  . Alcohol use: No    Alcohol/week: 0.0 oz    Family History  Problem Relation Age of Onset  . Asthma Maternal Uncle        Review of Systems Constitutional: negative for weight loss Genitourinary:+ for abnormal menstrual periods with contraception and negative for vaginal discharge   Objective:   BP 126/75   Pulse 84   Wt 143 lb (64.9 kg)   BMI 23.80 kg/m    General:   alert  PE Deferred  Lab Review Urine pregnancy test Labs reviewed yes Radiologic studies reviewed no  90% of 15 min visit spent on counseling and coordination of care.    Assessment:    22 y.o., discontinuing OCP (estrogen/progesterone), no contraindications.   AUB with COC  Plan:    Stop OCPs d/t AUB.   Consider CBC, TVUS if bleeding continues after stopping hormonal contraception    Continuing to wean daughter from breast feeding.    All questions answered.  Follow up as needed or if becomes sexually active again.

## 2018-04-21 IMAGING — US US MFM OB FOLLOW-UP
1 series · 14 of 28 positions shown · non-contrast
Comparison: none

[Series 1: us mfm ob follow-up · 51 acquisitions, 14 frames shown]
[im 2/51]
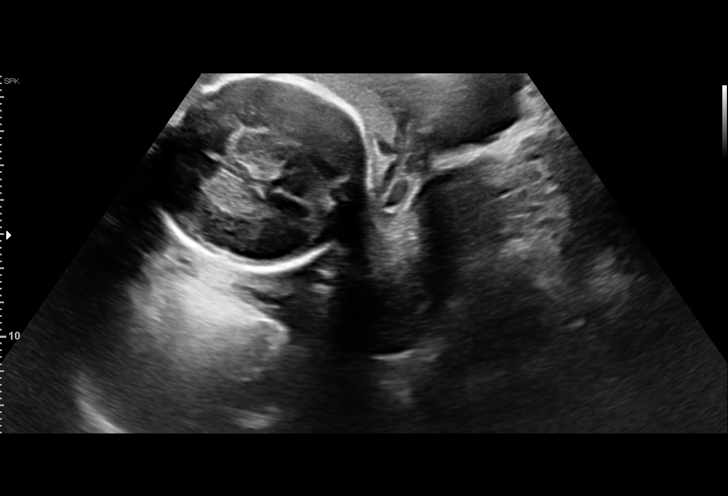
[im 6/51]
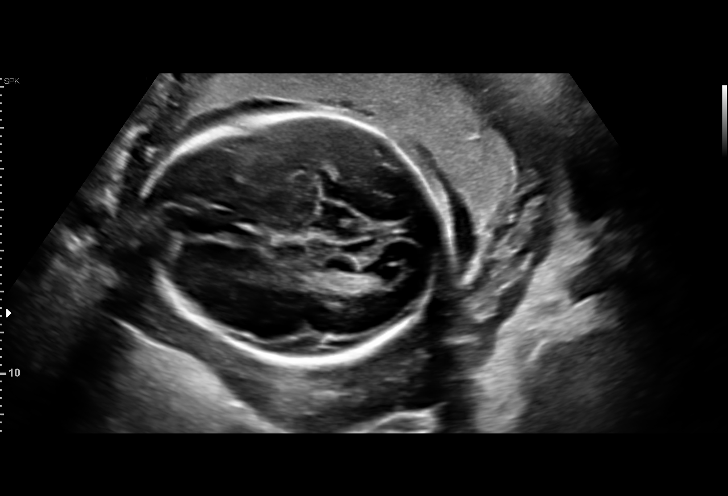
[im 10/51]
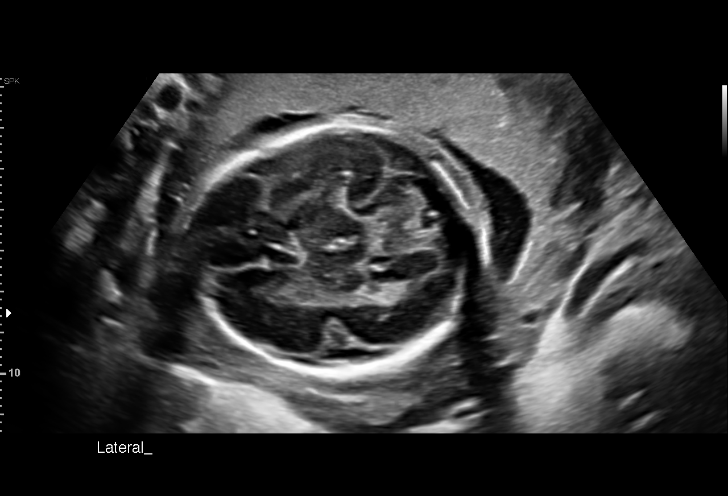
[im 13/51]
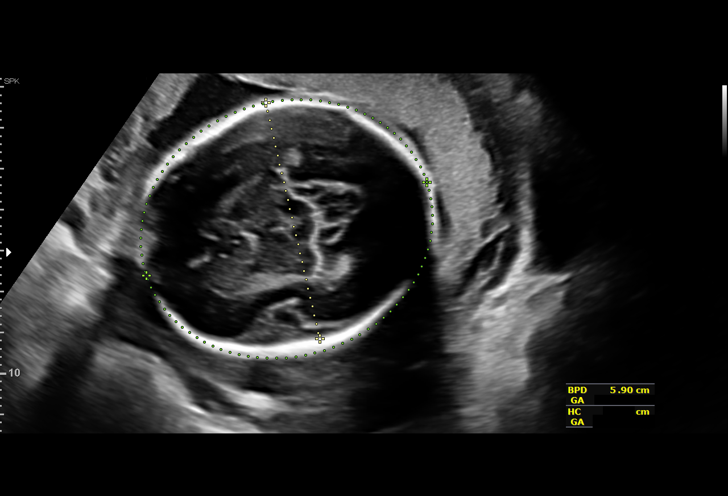
[im 17/51]
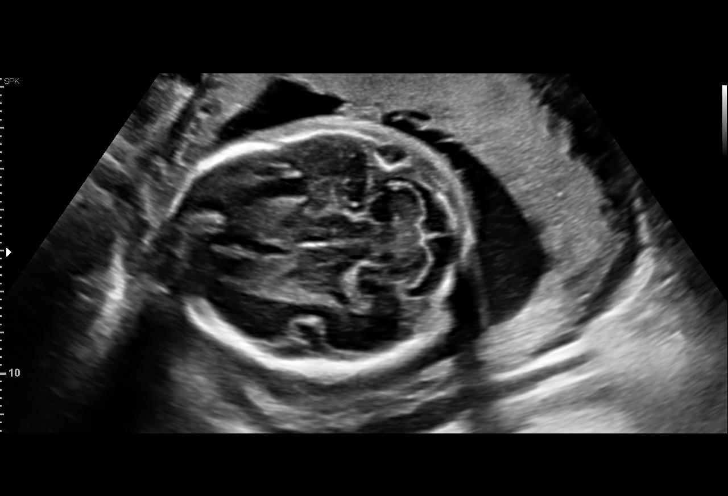
[im 21/51]
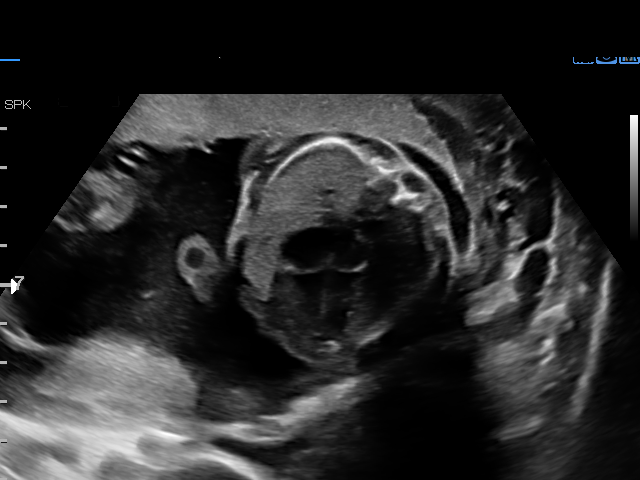
[im 25/51]
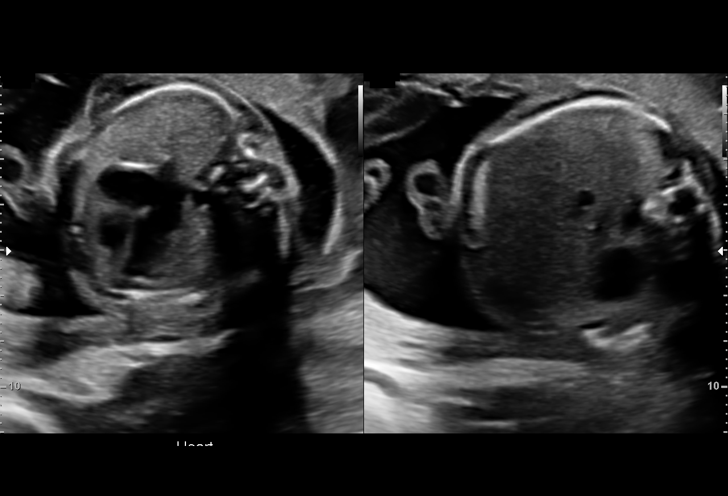
[im 28/51]
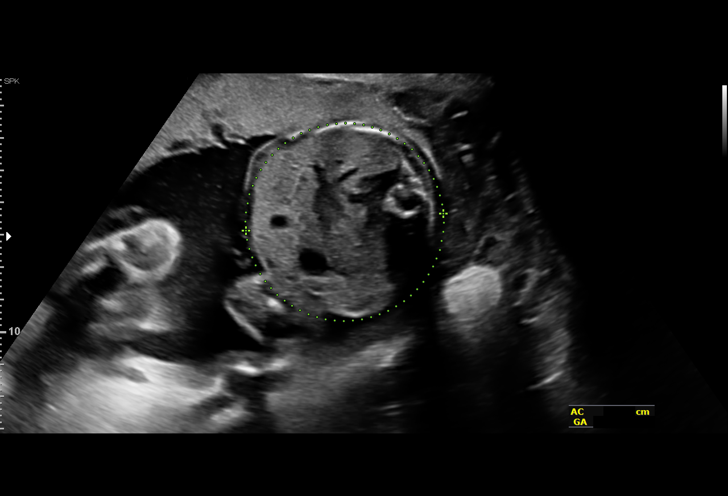
[im 32/51]
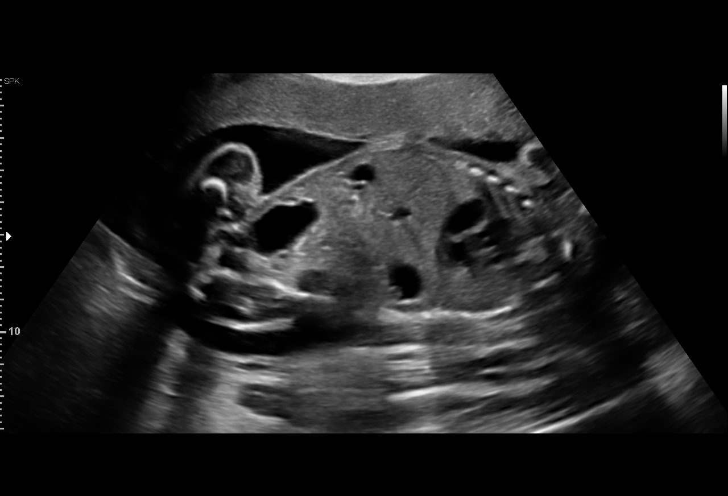
[im 36/51]
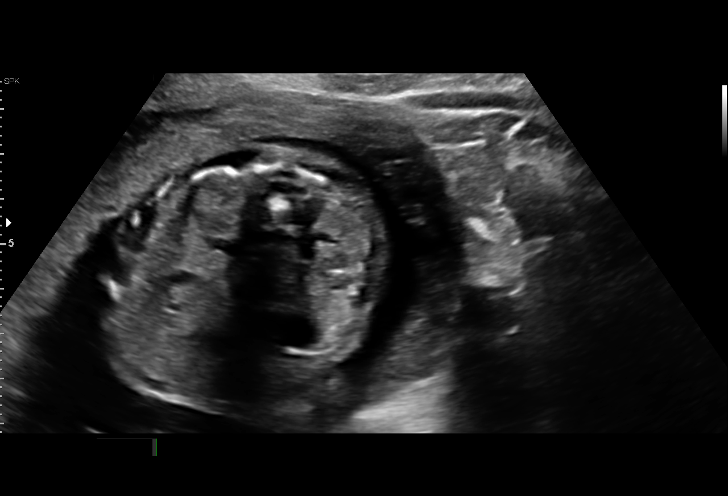
[im 39/51]
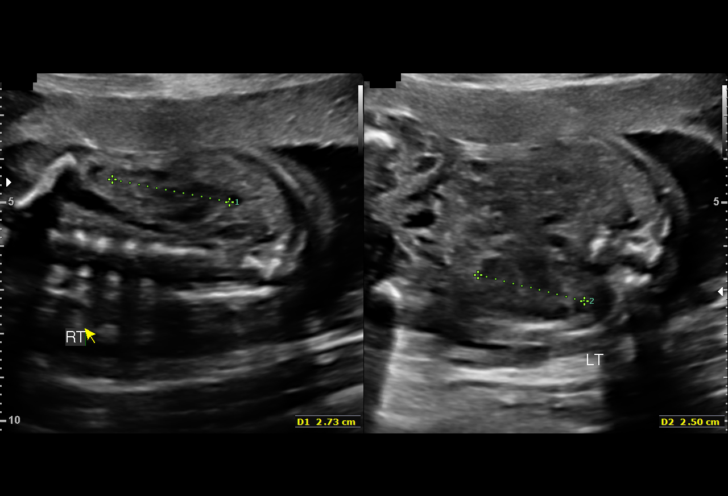
[im 43/51]
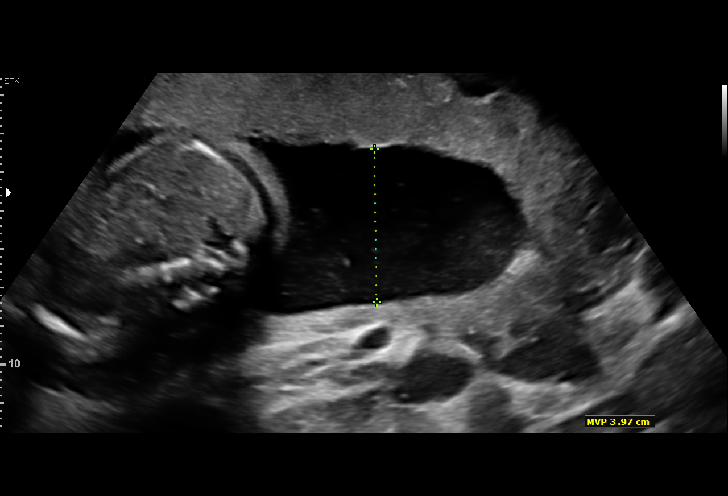
[im 47/51]
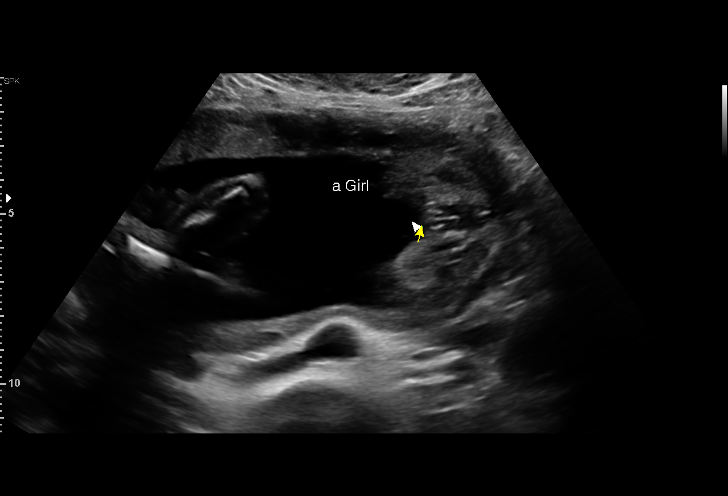
[im 51/51]
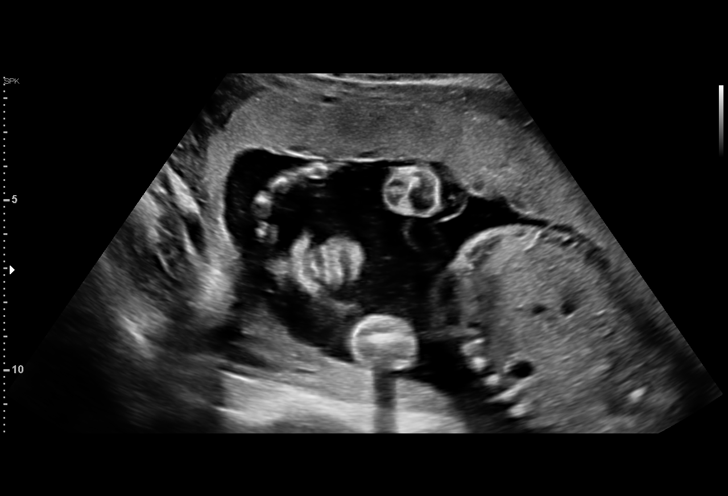

[14 of 28 positions shown; findings below may reference images not displayed]

1  NISHU GULLETT             [id number]      [id number]     [id number]
Indications

23 weeks gestation of pregnancy
Antenatal follow-up for nonvisualized fetal
anatomy
OB History

Gravidity:    1         Term:   0        Prem:   0        SAB:   0
TOP:          0       Ectopic:  0        Living: 0
Fetal Evaluation

Num Of Fetuses:     1
Fetal Heart         157
Rate(bpm):
Cardiac Activity:   Observed
Presentation:       Cephalic
Placenta:           Anterior, above cervical os

Amniotic Fluid
AFI FV:      Subjectively within normal limits

Largest Pocket(cm)
3.97
Biometry

BPD:      59.7  mm     G. Age:  24w 3d         63  %    CI:        78.19   %    70 - 86
FL/HC:      20.5   %    18.7 -
HC:      213.6  mm     G. Age:  23w 3d         19  %    HC/AC:      1.14        1.05 -
AC:      186.8  mm     G. Age:  23w 3d         29  %    FL/BPD:     73.4   %    71 - 87
FL:       43.8  mm     G. Age:  24w 3d         55  %    FL/AC:      23.4   %    20 - 24
HUM:      41.1  mm     G. Age:  25w 0d         66  %
CER:      26.3  mm     G. Age:  24w 1d         55  %
NFT:       2.8  mm
CM:        5.5  mm

Est. FW:     635  gm      1 lb 6 oz     52  %
Gestational Age

LMP:           23w 6d        Date:  05/28/16                 EDD:   03/04/17
U/S Today:     24w 0d                                        EDD:   03/03/17
Best:          23w 6d     Det. By:  Early Ultrasound         EDD:   03/04/17
(07/14/16)
Anatomy

Cranium:               Appears normal         Aortic Arch:            Previously seen
Cavum:                 Appears normal         Ductal Arch:            Previously seen
Ventricles:            Appears normal         Diaphragm:              Appears normal
Choroid Plexus:        Appears normal         Stomach:                Appears normal, left
sided
Cerebellum:            Appears normal         Abdomen:                Appears normal
Posterior Fossa:       Appears normal         Abdominal Wall:         Previously seen
Nuchal Fold:           Not applicable (>20    Cord Vessels:           Previously seen
wks GA)
Face:                  Appears normal         Kidneys:                Appear normal
(orbits and profile)
Lips:                  Appears normal         Bladder:                Appears normal
Thoracic:              Appears normal         Spine:                  Limited views
appear normal
Heart:                 Appears normal         Upper Extremities:      Previously seen
(4CH, axis, and situs
RVOT:                  Previously seen        Lower Extremities:      Previously seen
LVOT:                  Previously seen

Other:  Fetus appears to be a female. Technically difficult due to fetal position.
Cervix Uterus Adnexa

Cervix
Length:           2.99  cm.
Normal appearance by transabdominal scan.

Uterus
No abnormality visualized.

Left Ovary
Not visualized.

Right Ovary
Not visualized.
Impression

Singleton intrauterine pregnancy at 23+6 weeks, here to
complete anatomic survey
Review of the anatomy shows no sonographic markers for
aneuploidy or structural anomalies
Amniotic fluid volume is normal
Estimated fetal weight is 635g which is growth in the 52nd
percentile
Recommendations

All relevant anatomy has been seen, and growth is normal.
Follow-up ultrasounds as clinically indicated.

## 2018-05-02 ENCOUNTER — Encounter: Payer: Self-pay | Admitting: *Deleted

## 2018-06-04 ENCOUNTER — Ambulatory Visit (INDEPENDENT_AMBULATORY_CARE_PROVIDER_SITE_OTHER): Payer: Medicaid Other | Admitting: Obstetrics

## 2018-06-04 ENCOUNTER — Encounter: Payer: Self-pay | Admitting: Obstetrics

## 2018-06-04 ENCOUNTER — Other Ambulatory Visit (HOSPITAL_COMMUNITY)
Admission: RE | Admit: 2018-06-04 | Discharge: 2018-06-04 | Disposition: A | Discharge status: Home / Self Care | Payer: Medicaid Other | Source: Ambulatory Visit | Attending: Obstetrics | Admitting: Obstetrics

## 2018-06-04 VITALS — BP 139/75 | HR 92 | Wt 148.1 lb

## 2018-06-04 DIAGNOSIS — B9689 Other specified bacterial agents as the cause of diseases classified elsewhere: Secondary | ICD-10-CM | POA: Insufficient documentation

## 2018-06-04 DIAGNOSIS — N898 Other specified noninflammatory disorders of vagina: Secondary | ICD-10-CM | POA: Diagnosis not present

## 2018-06-04 DIAGNOSIS — N760 Acute vaginitis: Secondary | ICD-10-CM | POA: Insufficient documentation

## 2018-06-04 MED ORDER — METRONIDAZOLE 0.75 % VA GEL: 1.0000 | Freq: Two times a day (BID) | VAGINAL | 5 refills | Status: DC

## 2018-06-04 NOTE — Progress Notes (Signed)
Pt c/o vaginal itching and odor with white disharge.

## 2018-06-04 NOTE — Progress Notes (Signed)
Patient ID: Adriana [name], female   DOB: June 18, 1996, 21 y.o.   MRN: [id number]  Chief Complaint  Patient presents with  . Vaginitis    HPI Adriana [name] is a 22 y.o. female.  HAS MALODOROUS VAGINAL DISCHARGE. HPI  Past Medical History:  Diagnosis Date  . Migraines     Past Surgical History:  Procedure Laterality Date  . HERNIA REPAIR     as an infant    Family History  Problem Relation Age of Onset  . Asthma Maternal Uncle     Social History Social History   Tobacco Use  . Smoking status: Former Smoker    Last attempt to quit: 06/30/2016    Years since quitting: 1.9  . Smokeless tobacco: Never Used  Substance Use Topics  . Alcohol use: No    Alcohol/week: 0.0 oz  . Drug use: No    Allergies  Allergen Reactions  . Imitrex [Sumatriptan] Other (See Comments)    Chest pain, heaviness, migraine  . Latex Rash    Current Outpatient Medications  Medication Sig Dispense Refill  . metroNIDAZOLE (METROGEL VAGINAL) 0.75 % vaginal gel Place 1 Applicatorful vaginally 2 (two) times daily. USE ONCE A MONTH FOR 6 MONTHS. 70 g 5  . Prenatal Vit-Fe Fumarate-FA (MULTIVITAMIN-PRENATAL) 27-0.8 MG TABS tablet Take 1 tablet by mouth daily at 12 noon.     No current facility-administered medications for this visit.     Review of Systems Review of Systems Constitutional: negative for fatigue and weight loss Respiratory: negative for cough and wheezing Cardiovascular: negative for chest pain, fatigue and palpitations Gastrointestinal: negative for abdominal pain and change in bowel habits Genitourinary:POSITIVE for malodorous vaginal discharge Integument/breast: negative for nipple discharge Musculoskeletal:negative for myalgias Neurological: negative for gait problems and tremors Behavioral/Psych: negative for abusive relationship, depression Endocrine: negative for temperature intolerance      Blood pressure 139/75, pulse 92, weight 148 lb 1.6 oz (67.2 kg), currently  breastfeeding.  Physical Exam Physical Exam: Abdomen:  normal findings: no organomegaly, soft, non-tender and no hernia  Pelvis:  External genitalia: normal general appearance Urinary system: urethral meatus normal and bladder without fullness, nontender Vaginal: normal without tenderness, induration or masses Cervix: normal appearance Adnexa: normal bimanual exam Uterus: anteverted and non-tender, normal size    50% of 15 min visit spent on counseling and coordination of care.   Data Reviewed Wet Prep  Assessment     1. Vaginal discharge.  Probable BV, recurrent. Rx: - Cervicovaginal ancillary only - metroNIDAZOLE (METROGEL VAGINAL) 0.75 % vaginal gel; Place 1 Applicatorful vaginally 2 (two) times daily. USE ONCE A MONTH FOR 6 MONTHS.  Dispense: 70 g; Refill: 5    Plan    Follow up in 1 year for Annual.  No orders of the defined types were placed in this encounter.  Meds ordered this encounter  Medications  . metroNIDAZOLE (METROGEL VAGINAL) 0.75 % vaginal gel    Sig: Place 1 Applicatorful vaginally 2 (two) times daily. USE ONCE A MONTH FOR 6 MONTHS.    Dispense:  70 g    Refill:  5     Brock BadHARLES A. Merle Cirelli MD 06-04-2018

## 2018-06-05 ENCOUNTER — Other Ambulatory Visit: Payer: Self-pay | Admitting: Obstetrics

## 2018-06-05 LAB — CERVICOVAGINAL ANCILLARY ONLY
BACTERIAL VAGINITIS: POSITIVE — AB
CHLAMYDIA, DNA PROBE: NEGATIVE
Candida vaginitis: NEGATIVE
NEISSERIA GONORRHEA: NEGATIVE
Trichomonas: NEGATIVE

## 2018-07-12 ENCOUNTER — Encounter: Payer: Self-pay | Admitting: Obstetrics

## 2018-07-12 ENCOUNTER — Ambulatory Visit (INDEPENDENT_AMBULATORY_CARE_PROVIDER_SITE_OTHER): Payer: Medicaid Other

## 2018-07-12 VITALS — BP 117/79 | HR 80 | Wt 146.6 lb

## 2018-07-12 DIAGNOSIS — Z3201 Encounter for pregnancy test, result positive: Secondary | ICD-10-CM

## 2018-07-12 LAB — POCT URINE PREGNANCY: PREG TEST UR: POSITIVE — AB

## 2018-07-12 NOTE — Progress Notes (Signed)
Presents for pregnancy test.    Adriana [name] presents today for UPT. She has no unusual complaints. LMP: 05/13/18    OBJECTIVE: Appears well, in no apparent distress.  OB History    Gravida  2   Para  1   Term      Preterm  1   AB      Living  1     SAB      TAB      Ectopic      Multiple  0   Live Births  1          Home UPT Result:  POSITIVE In-Office UPT result: POSITIVE  I have reviewed the patient's medical, obstetrical, social, and family histories, and medications.   ASSESSMENT: Positive pregnancy test  PLAN Prenatal care to be completed at: [facility]

## 2018-07-25 ENCOUNTER — Encounter: Payer: Self-pay | Admitting: Family Medicine

## 2018-07-25 ENCOUNTER — Other Ambulatory Visit (HOSPITAL_COMMUNITY)
Admission: RE | Admit: 2018-07-25 | Discharge: 2018-07-25 | Disposition: A | Discharge status: Home / Self Care | Payer: Medicaid Other | Source: Ambulatory Visit | Attending: Family Medicine | Admitting: Family Medicine

## 2018-07-25 ENCOUNTER — Ambulatory Visit (INDEPENDENT_AMBULATORY_CARE_PROVIDER_SITE_OTHER): Payer: Medicaid Other | Admitting: Family Medicine

## 2018-07-25 DIAGNOSIS — Z3687 Encounter for antenatal screening for uncertain dates: Secondary | ICD-10-CM

## 2018-07-25 DIAGNOSIS — Z3A10 10 weeks gestation of pregnancy: Secondary | ICD-10-CM | POA: Insufficient documentation

## 2018-07-25 DIAGNOSIS — Z3481 Encounter for supervision of other normal pregnancy, first trimester: Secondary | ICD-10-CM

## 2018-07-25 DIAGNOSIS — Z3480 Encounter for supervision of other normal pregnancy, unspecified trimester: Secondary | ICD-10-CM

## 2018-07-25 NOTE — Progress Notes (Signed)
Subjective:   Adriana [name] is a 22 y.o. G2P0101 at 6146w3d by LMP being seen today for her first obstetrical visit.  Her obstetrical history is not significant,Patient does intend to breast feed. Pregnancy history fully reviewed.  Patient reports nausea and vomiting.  HISTORY: OB History  Gravida Para Term Preterm AB Living  2 1 0 1 0 1  SAB TAB Ectopic Multiple Live Births  0 0 0 0 1    # Outcome Date GA Lbr Len/2nd Weight Sex Delivery Anes PTL Lv  2 Current           1 Preterm 02/09/17 1064w5d 21:39 / 01:44 6 lb 6.8 oz (2.915 kg) F Vag-Spont EPI  LIV     Birth Comments: WNL     Name: Capron,GIRL Panzy     Apgar1: 8  Apgar5: 9    Past Medical History:  Diagnosis Date  . Migraines    Past Surgical History:  Procedure Laterality Date  . HERNIA REPAIR     as an infant   History reviewed. No pertinent family history. Social History   Tobacco Use  . Smoking status: Former Smoker    Last attempt to quit: 06/30/2016    Years since quitting: 2.0  . Smokeless tobacco: Never Used  Substance Use Topics  . Alcohol use: No    Alcohol/week: 0.0 standard drinks  . Drug use: No   Allergies  Allergen Reactions  . Imitrex [Sumatriptan] Other (See Comments)    Chest pain, heaviness, migraine  . Latex Rash   No current outpatient medications on file prior to visit.   No current facility-administered medications on file prior to visit.      Exam   Vitals:   07/25/18 1518  BP: 129/78  Pulse: 84  Weight: 143 lb (64.9 kg)   Fetal Heart Rate (bpm): pos on POCUS  Uterus:     Pelvic Exam: Perineum: no hemorrhoids, normal perineum   Vulva: normal external genitalia, no lesions   Vagina:  normal mucosa, normal discharge   Cervix: no lesions and normal, pap smear done.    Adnexa: normal adnexa and no mass, fullness, tenderness   Bony Pelvis: average  System: General: well-developed, well-nourished female in no acute distress   Breast:  normal appearance, no masses or  tenderness   Skin: normal coloration and turgor, no rashes   Neurologic: oriented, normal, negative, normal mood   Extremities: normal strength, tone, and muscle mass, ROM of all joints is normal   HEENT PERRLA, extraocular movement intact and sclera clear, anicteric   Mouth/Teeth mucous membranes moist, pharynx normal without lesions and dental hygiene good   Neck supple and no masses   Cardiovascular: regular rate and rhythm   Respiratory:  no respiratory distress, normal breath sounds   Abdomen: soft, non-tender; bowel sounds normal; no masses,  no organomegaly    POCUS reveals SIUP with + flicker and + fetal movement Assessment:   Pregnancy: G2P0101 Patient Active Problem List   Diagnosis Date Noted  . Supervision of other normal pregnancy, antepartum 07/25/2018  . Migraine headache with aura 04/16/2014     Plan:  1. Supervision of other normal pregnancy, antepartum  - Urine cytology ancillary only - Obstetric Panel, Including HIV - Culture, OB Urine - SMN1 Copy Number Analysis - Enroll Patient in [employer] - Genetic Screening - [employer] Schedule Optimization   Initial labs drawn. Continue prenatal vitamins. Genetic Screening discussed, NIPS: ordered. Problem list reviewed and updated.  Routine  obstetric precautions reviewed. Return in 3 weeks (on 08/15/2018).

## 2018-07-25 NOTE — Patient Instructions (Addendum)
Genetic Screening Results Information: You are having genetic testing called [facility] today.  It will take approximately 2 weeks before the results are available.  To get your results, you need Internet access to a web browser to search Index/MyChart (the direct app on your phone will not give you these results).  Then select Lab Scanned and click on the blue hyper link that says View Image to see your [facility] results.  You can also use the directions on the purple card given to look up your results directly on the [employer] website.  Breastfeeding Choosing to breastfeed is one of the best decisions you can make for yourself and your baby. A change in hormones during pregnancy causes your breasts to make breast milk in your milk-producing glands. Hormones prevent breast milk from being released before your baby is born. They also prompt milk flow after birth. Once breastfeeding has begun, thoughts of your baby, as well as his or her sucking or crying, can stimulate the release of milk from your milk-producing glands. Benefits of breastfeeding Research shows that breastfeeding offers many health benefits for infants and mothers. It also offers a cost-free and convenient way to feed your baby. For your baby  Your first milk (colostrum) helps your baby's digestive system to function better.  Special cells in your milk (antibodies) help your baby to fight off infections.  Breastfed babies are less likely to develop asthma, allergies, obesity, or type 2 diabetes. They are also at lower risk for sudden infant death syndrome (SIDS).  Nutrients in breast milk are better able to meet your baby's needs compared to infant formula.  Breast milk improves your baby's brain development. For you  Breastfeeding helps to create a very special bond between you and your baby.  Breastfeeding is convenient. Breast milk costs nothing and is always available at the correct temperature.  Breastfeeding helps to  burn calories. It helps you to lose the weight that you gained during pregnancy.  Breastfeeding makes your uterus return faster to its size before pregnancy. It also slows bleeding (lochia) after you give birth.  Breastfeeding helps to lower your risk of developing type 2 diabetes, osteoporosis, rheumatoid arthritis, cardiovascular disease, and breast, ovarian, uterine, and endometrial cancer later in life. Breastfeeding basics Starting breastfeeding  Find a comfortable place to sit or lie down, with your neck and back well-supported.  Place a pillow or a rolled-up blanket under your baby to bring him or her to the level of your breast (if you are seated). Nursing pillows are specially designed to help support your arms and your baby while you breastfeed.  Make sure that your baby's tummy (abdomen) is facing your abdomen.  Gently massage your breast. With your fingertips, massage from the outer edges of your breast inward toward the nipple. This encourages milk flow. If your milk flows slowly, you may need to continue this action during the feeding.  Support your breast with 4 fingers underneath and your thumb above your nipple (make the letter "C" with your hand). Make sure your fingers are well away from your nipple and your baby's mouth.  Stroke your baby's lips gently with your finger or nipple.  When your baby's mouth is open wide enough, quickly bring your baby to your breast, placing your entire nipple and as much of the areola as possible into your baby's mouth. The areola is the colored area around your nipple. ? More areola should be visible above your baby's upper lip than below the lower  lip. ? Your baby's lips should be opened and extended outward (flanged) to ensure an adequate, comfortable latch. ? Your baby's tongue should be between his or her lower gum and your breast.  Make sure that your baby's mouth is correctly positioned around your nipple (latched). Your baby's lips  should create a seal on your breast and be turned out (everted).  It is common for your baby to suck about 2-3 minutes in order to start the flow of breast milk. Latching Teaching your baby how to latch onto your breast properly is very important. An improper latch can cause nipple pain, decreased milk supply, and poor weight gain in your baby. Also, if your baby is not latched onto your nipple properly, he or she may swallow some air during feeding. This can make your baby fussy. Burping your baby when you switch breasts during the feeding can help to get rid of the air. However, teaching your baby to latch on properly is still the best way to prevent fussiness from swallowing air while breastfeeding. Signs that your baby has successfully latched onto your nipple  Silent tugging or silent sucking, without causing you pain. Infant's lips should be extended outward (flanged).  Swallowing heard between every 3-4 sucks once your milk has started to flow (after your let-down milk reflex occurs).  Muscle movement above and in front of his or her ears while sucking.  Signs that your baby has not successfully latched onto your nipple  Sucking sounds or smacking sounds from your baby while breastfeeding.  Nipple pain.  If you think your baby has not latched on correctly, slip your finger into the corner of your baby's mouth to break the suction and place it between your baby's gums. Attempt to start breastfeeding again. Signs of successful breastfeeding Signs from your baby  Your baby will gradually decrease the number of sucks or will completely stop sucking.  Your baby will fall asleep.  Your baby's body will relax.  Your baby will retain a small amount of milk in his or her mouth.  Your baby will let go of your breast by himself or herself.  Signs from you  Breasts that have increased in firmness, weight, and size 1-3 hours after feeding.  Breasts that are softer immediately after  breastfeeding.  Increased milk volume, as well as a change in milk consistency and color by the fifth day of breastfeeding.  Nipples that are not sore, cracked, or bleeding.  Signs that your baby is getting enough milk  Wetting at least 1-2 diapers during the first 24 hours after birth.  Wetting at least 5-6 diapers every 24 hours for the first week after birth. The urine should be clear or pale yellow by the age of 5 days.  Wetting 6-8 diapers every 24 hours as your baby continues to grow and develop.  At least 3 stools in a 24-hour period by the age of 5 days. The stool should be soft and yellow.  At least 3 stools in a 24-hour period by the age of 7 days. The stool should be seedy and yellow.  No loss of weight greater than 10% of birth weight during the first 3 days of life.  Average weight gain of 4-7 oz (113-198 g) per week after the age of 4 days.  Consistent daily weight gain by the age of 5 days, without weight loss after the age of 2 weeks. After a feeding, your baby may spit up a small amount of  milk. This is normal. Breastfeeding frequency and duration Frequent feeding will help you make more milk and can prevent sore nipples and extremely full breasts (breast engorgement). Breastfeed when you feel the need to reduce the fullness of your breasts or when your baby shows signs of hunger. This is called "breastfeeding on demand." Signs that your baby is hungry include:  Increased alertness, activity, or restlessness.  Movement of the head from side to side.  Opening of the mouth when the corner of the mouth or cheek is stroked (rooting).  Increased sucking sounds, smacking lips, cooing, sighing, or squeaking.  Hand-to-mouth movements and sucking on fingers or hands.  Fussing or crying.  Avoid introducing a pacifier to your baby in the first 4-6 weeks after your baby is born. After this time, you may choose to use a pacifier. Research has shown that pacifier use during  the first year of a baby's life decreases the risk of sudden infant death syndrome (SIDS). Allow your baby to feed on each breast as long as he or she wants. When your baby unlatches or falls asleep while feeding from the first breast, offer the second breast. Because newborns are often sleepy in the first few weeks of life, you may need to awaken your baby to get him or her to feed. Breastfeeding times will vary from baby to baby. However, the following rules can serve as a guide to help you make sure that your baby is properly fed:  Newborns (babies 52 weeks of age or younger) may breastfeed every 1-3 hours.  Newborns should not go without breastfeeding for longer than 3 hours during the day or 5 hours during the night.  You should breastfeed your baby a minimum of 8 times in a 24-hour period.  Breast milk pumping Pumping and storing breast milk allows you to make sure that your baby is exclusively fed your breast milk, even at times when you are unable to breastfeed. This is especially important if you go back to work while you are still breastfeeding, or if you are not able to be present during feedings. Your lactation consultant can help you find a method of pumping that works best for you and give you guidelines about how long it is safe to store breast milk. Caring for your breasts while you breastfeed Nipples can become dry, cracked, and sore while breastfeeding. The following recommendations can help keep your breasts moisturized and healthy:  Avoid using soap on your nipples.  Wear a supportive bra designed especially for nursing. Avoid wearing underwire-style bras or extremely tight bras (sports bras).  Air-dry your nipples for 3-4 minutes after each feeding.  Use only cotton bra pads to absorb leaked breast milk. Leaking of breast milk between feedings is normal.  Use lanolin on your nipples after breastfeeding. Lanolin helps to maintain your skin's normal moisture barrier. Pure  lanolin is not harmful (not toxic) to your baby. You may also hand express a few drops of breast milk and gently massage that milk into your nipples and allow the milk to air-dry.  In the first few weeks after giving birth, some women experience breast engorgement. Engorgement can make your breasts feel heavy, warm, and tender to the touch. Engorgement peaks within 3-5 days after you give birth. The following recommendations can help to ease engorgement:  Completely empty your breasts while breastfeeding or pumping. You may want to start by applying warm, moist heat (in the shower or with warm, water-soaked hand towels) just before  feeding or pumping. This increases circulation and helps the milk flow. If your baby does not completely empty your breasts while breastfeeding, pump any extra milk after he or she is finished.  Apply ice packs to your breasts immediately after breastfeeding or pumping, unless this is too uncomfortable for you. To do this: ? Put ice in a plastic bag. ? Place a towel between your skin and the bag. ? Leave the ice on for 20 minutes, 2-3 times a day.  Make sure that your baby is latched on and positioned properly while breastfeeding.  If engorgement persists after 48 hours of following these recommendations, contact your health care provider or a Advertising copywriter. Overall health care recommendations while breastfeeding  Eat 3 healthy meals and 3 snacks every day. Well-nourished mothers who are breastfeeding need an additional 450-500 calories a day. You can meet this requirement by increasing the amount of a balanced diet that you eat.  Drink enough water to keep your urine pale yellow or clear.  Rest often, relax, and continue to take your prenatal vitamins to prevent fatigue, stress, and low vitamin and mineral levels in your body (nutrient deficiencies).  Do not use any products that contain nicotine or tobacco, such as cigarettes and e-cigarettes. Your baby may  be harmed by chemicals from cigarettes that pass into breast milk and exposure to secondhand smoke. If you need help quitting, ask your health care provider.  Avoid alcohol.  Do not use illegal drugs or marijuana.  Talk with your health care provider before taking any medicines. These include over-the-counter and prescription medicines as well as vitamins and herbal supplements. Some medicines that may be harmful to your baby can pass through breast milk.  It is possible to become pregnant while breastfeeding. If birth control is desired, ask your health care provider about options that will be safe while breastfeeding your baby. Where to find more information: [employer]: www.llli.org Contact a health care provider if:  You feel like you want to stop breastfeeding or have become frustrated with breastfeeding.  Your nipples are cracked or bleeding.  Your breasts are red, tender, or warm.  You have: ? Painful breasts or nipples. ? A swollen area on either breast. ? A fever or chills. ? Nausea or vomiting. ? Drainage other than breast milk from your nipples.  Your breasts do not become full before feedings by the fifth day after you give birth.  You feel sad and depressed.  Your baby is: ? Too sleepy to eat well. ? Having trouble sleeping. ? More than 64 week old and wetting fewer than 6 diapers in a 24-hour period. ? Not gaining weight by 83 days of age.  Your baby has fewer than 3 stools in a 24-hour period.  Your baby's skin or the white parts of his or her eyes become yellow. Get help right away if:  Your baby is overly tired (lethargic) and does not want to wake up and feed.  Your baby develops an unexplained fever. Summary  Breastfeeding offers many health benefits for infant and mothers.  Try to breastfeed your infant when he or she shows early signs of hunger.  Gently tickle or stroke your baby's lips with your finger or nipple to allow the  baby to open his or her mouth. Bring the baby to your breast. Make sure that much of the areola is in your baby's mouth. Offer one side and burp the baby before you offer the other side.  Talk with your health care provider or lactation consultant if you have questions or you face problems as you breastfeed. This information is not intended to replace advice given to you by your health care provider. Make sure you discuss any questions you have with your health care provider. Document Released: 11/14/2005 Document Revised: 12/16/2016 Document Reviewed: 12/16/2016 Elsevier Interactive Patient Education  [employer]2018 Elsevier Inc.

## 2018-07-27 LAB — URINE CYTOLOGY ANCILLARY ONLY
Chlamydia: NEGATIVE
Neisseria Gonorrhea: NEGATIVE

## 2018-07-27 LAB — URINE CULTURE, OB REFLEX

## 2018-07-27 LAB — CULTURE, OB URINE

## 2018-07-30 DIAGNOSIS — Z3480 Encounter for supervision of other normal pregnancy, unspecified trimester: Secondary | ICD-10-CM

## 2018-08-03 ENCOUNTER — Encounter: Payer: Self-pay | Admitting: Family Medicine

## 2018-08-07 LAB — OBSTETRIC PANEL, INCLUDING HIV
Antibody Screen: NEGATIVE
BASOS ABS: 0 10*3/uL (ref 0.0–0.2)
Basos: 0 %
EOS (ABSOLUTE): 0 10*3/uL (ref 0.0–0.4)
Eos: 1 %
HIV SCREEN 4TH GENERATION: NONREACTIVE
Hematocrit: 36.8 % (ref 34.0–46.6)
Hemoglobin: 12.6 g/dL (ref 11.1–15.9)
Hepatitis B Surface Ag: NEGATIVE
Immature Grans (Abs): 0 10*3/uL (ref 0.0–0.1)
Immature Granulocytes: 0 %
LYMPHS ABS: 2.2 10*3/uL (ref 0.7–3.1)
Lymphs: 42 %
MCH: 33.6 pg — AB (ref 26.6–33.0)
MCHC: 34.2 g/dL (ref 31.5–35.7)
MCV: 98 fL — ABNORMAL HIGH (ref 79–97)
Monocytes Absolute: 0.4 10*3/uL (ref 0.1–0.9)
Monocytes: 7 %
NEUTROS ABS: 2.6 10*3/uL (ref 1.4–7.0)
Neutrophils: 50 %
Platelets: 294 10*3/uL (ref 150–450)
RBC: 3.75 x10E6/uL — ABNORMAL LOW (ref 3.77–5.28)
RDW: 12.4 % (ref 12.3–15.4)
RPR Ser Ql: NONREACTIVE
Rh Factor: POSITIVE
Rubella Antibodies, IGG: 5.38 index (ref >0.99)
WBC: 5.3 10*3/uL (ref 3.4–10.8)

## 2018-08-07 LAB — SMN1 COPY NUMBER ANALYSIS (SMA CARRIER SCREENING)

## 2018-08-15 ENCOUNTER — Encounter: Payer: Medicaid Other | Admitting: Obstetrics & Gynecology

## 2018-08-15 NOTE — Progress Notes (Deleted)
   Patient did not show up today for her scheduled appointment.   Greenlee Ancheta, MD, FACOG Obstetrician & Gynecologist, [facility], [facility]  

## 2018-08-17 ENCOUNTER — Ambulatory Visit (INDEPENDENT_AMBULATORY_CARE_PROVIDER_SITE_OTHER): Payer: Medicaid Other | Admitting: Advanced Practice Midwife

## 2018-08-17 VITALS — BP 119/76 | HR 77 | Wt 145.4 lb

## 2018-08-17 DIAGNOSIS — Z3480 Encounter for supervision of other normal pregnancy, unspecified trimester: Secondary | ICD-10-CM

## 2018-08-17 DIAGNOSIS — O219 Vomiting of pregnancy, unspecified: Secondary | ICD-10-CM

## 2018-08-17 MED ORDER — PRENATAL GUMMIES/DHA & FA 0.4-32.5 MG PO CHEW: 1.0000 | CHEWABLE_TABLET | Freq: Every day | ORAL | 3 refills | Status: DC

## 2018-08-17 MED ORDER — DOXYLAMINE-PYRIDOXINE ER 20-20 MG PO TBCR: 1.0000 | EXTENDED_RELEASE_TABLET | Freq: Three times a day (TID) | ORAL | 1 refills | Status: DC | PRN

## 2018-08-17 MED ORDER — FERROUS SULFATE DRIED ER 160 (50 FE) MG PO TBCR: 1.0000 | EXTENDED_RELEASE_TABLET | Freq: Every day | ORAL | 0 refills | Status: DC

## 2018-08-17 NOTE — Progress Notes (Addendum)
    PRENATAL VISIT NOTE  Subjective:  Adriana [name] is a 22 y.o. G2P0101 at 2560w5d being seen today for ongoing prenatal care.  She is currently monitored for the following issues for this low-risk pregnancy and has Migraine headache with aura and Supervision of other normal pregnancy, antepartum on their problem list.  Patient reports nausea and vomiting. Patient states she wakes up each morning and is immediately aware of nausea. Occasional vomits but still able to tolerate PO. Tolerated sausage, egg and cheese sandwich last night. Contractions: Not present. Vag. Bleeding: None.   . Denies leaking of fluid.   The following portions of the patient's history were reviewed and updated as appropriate: allergies, current medications, past family history, past medical history, past social history, past surgical history and problem list. Problem list updated.  Objective:   Vitals:   08/17/18 1122  BP: 119/76  Pulse: 77  Weight: 145 lb 6.4 oz (66 kg)    Fetal Status: Fetal Heart Rate (bpm): 155         General:  Alert, oriented and cooperative. Patient is in no acute distress.  Skin: Skin is warm and dry. No rash noted.   Cardiovascular: Normal heart rate noted  Respiratory: Normal respiratory effort, no problems with respiration noted  Abdomen: Soft, gravid, appropriate for gestational age.  Pain/Pressure: Absent     Pelvic: Cervical exam deferred        Extremities: Normal range of motion.  Edema: None  Mental Status: Normal mood and affect. Normal behavior. Normal judgment and thought content.   Assessment and Plan:  Pregnancy: G2P0101 at 2360w5d  1. Supervision of other normal pregnancy, antepartum - Continue routine care on optimized schedule - Reviewed [facility] results - Patient has not initiated PNV due to nausea - Rx prenatal gummies and Slow Fe to pharmacy - AFP next visit - Patient verbalizes [facility]. Discussed scope of practice and need for  co-management if complications arise - Declined flu shot today - US MFM OB COMP + 14 WK; Future  2. Nausea and vomiting during pregnancy prior to [redacted] weeks gestation - 2 lb weight gain from previous visit on 07/25/18 - Discussed dietary modifications: solid before liquid, bland dry food - Rx Bonjesta to patient pharmacy  Preterm labor symptoms and general obstetric precautions including but not limited to vaginal bleeding, contractions, leaking of fluid and fetal movement were reviewed in detail with the patient. Please refer to After Visit Summary for other counseling recommendations.  Return in about 5 weeks (around 09/21/2018).  Future Appointments  Date Time Provider Department Center  09/20/2018 11:00 AM Sharyon Cableogers, Veronica C, CNM [facility] None  09/21/2018 10:45 AM [facility] US 2 [facility]    Calvert CantorSamantha C Weinhold, CNM  08/17/18  11:48 AM

## 2018-08-17 NOTE — Patient Instructions (Signed)
Second Trimester of Pregnancy The second trimester is from week 13 through week 28, month 4 through 6. This is often the time in pregnancy that you feel your best. Often times, morning sickness has lessened or quit. You may have more energy, and you may get hungry more often. Your unborn baby (fetus) is growing rapidly. At the end of the sixth month, he or she is about 9 inches long and weighs about 1 pounds. You will likely feel the baby move (quickening) between 18 and 20 weeks of pregnancy.  Research childbirth classes and hospital preregistration at [email or web address]  Follow these instructions at home:  Avoid all smoking, herbs, and alcohol. Avoid drugs not approved by your doctor.  Do not use any tobacco products, including cigarettes, chewing tobacco, and electronic cigarettes. If you need help quitting, ask your doctor. You may get counseling or other support to help you quit.  Only take medicine as told by your doctor. Some medicines are safe and some are not during pregnancy.  Exercise only as told by your doctor. Stop exercising if you start having cramps.  Eat regular, healthy meals.  Wear a good support bra if your breasts are tender.  Do not use hot tubs, steam rooms, or saunas.  Wear your seat belt when driving.  Avoid raw meat, uncooked cheese, and liter boxes and soil used by cats.  Take your prenatal vitamins.  Take 1500-2000 milligrams of calcium daily starting at the 20th week of pregnancy until you deliver your baby.  Try taking medicine that helps you poop (stool softener) as needed, and if your doctor approves. Eat more fiber by eating fresh fruit, vegetables, and whole grains. Drink enough fluids to keep your pee (urine) clear or pale yellow.  Take warm water baths (sitz baths) to soothe pain or discomfort caused by hemorrhoids. Use hemorrhoid cream if your doctor approves.  If you have puffy, bulging veins (varicose veins), wear support hose. Raise  (elevate) your feet for 15 minutes, 3-4 times a day. Limit salt in your diet.  Avoid heavy lifting, wear low heals, and sit up straight.  Rest with your legs raised if you have leg cramps or low back pain.  Visit your dentist if you have not gone during your pregnancy. Use a soft toothbrush to brush your teeth. Be gentle when you floss.  You can have sex (intercourse) unless your doctor tells you not to.  Go to your doctor visits.  Get help if:  You feel dizzy.  You have mild cramps or pressure in your lower belly (abdomen).  You have a nagging pain in your belly area.  You continue to feel sick to your stomach (nauseous), throw up (vomit), or have watery poop (diarrhea).  You have bad smelling fluid coming from your vagina.  You have pain with peeing (urination). Get help right away if:  You have a fever.  You are leaking fluid from your vagina.  You have spotting or bleeding from your vagina.  You have severe belly cramping or pain.  You lose or gain weight rapidly.  You have trouble catching your breath and have chest pain.  You notice sudden or extreme puffiness (swelling) of your face, hands, ankles, feet, or legs.  You have not felt the baby move in over an hour.  You have severe headaches that do not go away with medicine.  You have vision changes. This information is not intended to replace advice given to you by your health care provider. Make   sure you discuss any questions you have with your health care provider. Document Released: 02/08/2010 Document Revised: 04/21/2016 Document Reviewed: 01/15/2013 Elsevier Interactive Patient Education  2017 Elsevier Inc.    

## 2018-08-17 NOTE — Progress Notes (Signed)
Pt is here for ROB G2P1 1310w5d.

## 2018-08-30 ENCOUNTER — Telehealth: Payer: Self-pay

## 2018-08-30 NOTE — Telephone Encounter (Signed)
Pt called and left message on vm. Attempted to call pt. LM with family member to have pt return call

## 2018-08-31 ENCOUNTER — Telehealth: Payer: Self-pay

## 2018-08-31 NOTE — Telephone Encounter (Signed)
Returned call, no answer, left vm 

## 2018-09-11 ENCOUNTER — Other Ambulatory Visit: Payer: Self-pay | Admitting: Obstetrics

## 2018-09-11 ENCOUNTER — Telehealth: Payer: Self-pay

## 2018-09-11 DIAGNOSIS — O219 Vomiting of pregnancy, unspecified: Secondary | ICD-10-CM

## 2018-09-11 MED ORDER — PROMETHAZINE HCL 25 MG PO TABS: 25.0000 mg | ORAL_TABLET | Freq: Four times a day (QID) | ORAL | 1 refills | Status: DC | PRN

## 2018-09-11 NOTE — Telephone Encounter (Signed)
TC from pt regarding Rx for nausea and vomiting Bonjesta was prescribed however is not cost effective for pt Pt is fine w/ different Rx being sent Pt unable to come in for samples Please advise pt pharmacy is [facility] on [address] Pt will be notified once Rx is sent.

## 2018-09-12 NOTE — Telephone Encounter (Signed)
Phenergan Rx 

## 2018-09-14 ENCOUNTER — Encounter (HOSPITAL_COMMUNITY): Payer: Self-pay

## 2018-09-20 ENCOUNTER — Encounter: Payer: Self-pay | Admitting: Certified Nurse Midwife

## 2018-09-20 ENCOUNTER — Ambulatory Visit (INDEPENDENT_AMBULATORY_CARE_PROVIDER_SITE_OTHER): Payer: Medicaid Other | Admitting: Certified Nurse Midwife

## 2018-09-20 DIAGNOSIS — R12 Heartburn: Secondary | ICD-10-CM

## 2018-09-20 DIAGNOSIS — Z3480 Encounter for supervision of other normal pregnancy, unspecified trimester: Secondary | ICD-10-CM

## 2018-09-20 DIAGNOSIS — O26892 Other specified pregnancy related conditions, second trimester: Secondary | ICD-10-CM

## 2018-09-20 MED ORDER — FAMOTIDINE 20 MG PO TABS: 20.0000 mg | ORAL_TABLET | Freq: Two times a day (BID) | ORAL | 1 refills | Status: DC

## 2018-09-20 NOTE — Patient Instructions (Signed)
Heartburn During Pregnancy Heartburn is pain or discomfort in the throat or chest. It may cause a burning feeling. It happens when stomach acid moves up into the tube that carries food from your mouth to your stomach (esophagus). Heartburn is common during pregnancy. It usually goes away or gets better after giving birth. Follow these instructions at home: Eating and drinking  Do not drink alcohol while you are pregnant.  Figure out which foods and beverages make you feel worse, and avoid them.  Beverages that you may want to avoid include: ? Coffee and tea (with or without caffeine). ? Energy drinks and sports drinks. ? Bubbly (carbonated) drinks or sodas. ? Citrus fruit juices.  Foods that you may want to avoid include: ? Chocolate and cocoa. ? Peppermint and mint flavorings. ? Garlic, onions, and horseradish. ? Spicy and acidic foods. These include peppers, chili powder, curry powder, vinegar, hot sauces, and barbecue sauce. ? Citrus fruits, such as oranges, lemons, and limes. ? Tomato-based foods, such as red sauce, chili, and salsa. ? Fried and fatty foods, such as donuts, french fries, potato chips, and high-fat dressings. ? High-fat meats, such as hot dogs, cold cuts, sausage, ham, and bacon. ? High-fat dairy items, such as whole milk, butter, and cheese.  Eat small meals often, instead of large meals.  Avoid drinking a lot of liquid with your meals.  Avoid eating meals during the 2-3 hours before you go to bed.  Avoid lying down right after you eat.  Do not exercise right after you eat. Medicines  Take over-the-counter and prescription medicines only as told by your doctor.  Do not take aspirin, ibuprofen, or other NSAIDs unless your doctor tells you to do that.  Your doctor may tell you to avoid medicines that have sodium bicarbonate in them. General instructions  If told, raise the head of your bed about 6 inches (15 cm). You can do this by putting blocks under  the legs. Sleeping with more pillows does not help with heartburn.  Do not use any products that contain nicotine or tobacco, such as cigarettes and e-cigarettes. If you need help quitting, ask your doctor.  Wear loose-fitting clothing.  Try to lower your stress, such as with yoga or meditation. If you need help, ask your doctor.  Stay at a healthy weight. If you are overweight, work with your doctor to safely lose weight.  Keep all follow-up visits as told by your doctor. This is important. Contact a doctor if:  You get new symptoms.  Your symptoms do not get better with treatment.  You have weight loss and you do not know why.  You have trouble swallowing.  You make loud sounds when you breathe (wheeze).  You have a cough that does not go away.  You have heartburn often for more than 2 weeks.  You feel sick to your stomach (nauseous), and this does not get better with treatment.  You are throwing up (vomiting), and this does not get better with treatment.  You have pain in your belly (abdomen). Get help right away if:  You have very bad chest pain that spreads to your arm, neck, or jaw.  You feel sweaty, dizzy, or light-headed.  You have trouble breathing.  You have pain when swallowing.  You throw up and your throw-up looks like blood or coffee grounds.  Your poop (stool) is bloody or black. This information is not intended to replace advice given to you by your health care provider.   Make sure you discuss any questions you have with your health care provider. Document Released: 12/17/2010 Document Revised: 08/01/2016 Document Reviewed: 08/01/2016 Elsevier Interactive Patient Education  2017 [employer]  

## 2018-09-20 NOTE — Progress Notes (Signed)
Pt presents for ROB c/o acid reflux.

## 2018-09-20 NOTE — Progress Notes (Signed)
   PRENATAL VISIT NOTE  Subjective:  Adriana [name] is a 22 y.o. G2P0101 at [redacted]w[redacted]d being seen today for ongoing prenatal care.  She is currently monitored for the following issues for this low-risk pregnancy and has Migraine headache with aura; Supervision of other normal pregnancy, antepartum; and Heartburn during pregnancy in second trimester on their problem list.  Patient reports acid reflux and heartburn.  Contractions: Not present. Vag. Bleeding: None.  Movement: Present. Denies leaking of fluid.   The following portions of the patient's history were reviewed and updated as appropriate: allergies, current medications, past family history, past medical history, past social history, past surgical history and problem list. Problem list updated.  Objective:   Vitals:   09/20/18 1112  BP: 132/77  Pulse: 91  Weight: 156 lb 12.8 oz (71.1 kg)    Fetal Status: Fetal Heart Rate (bpm): 148   Movement: Present     General:  Alert, oriented and cooperative. Patient is in no acute distress.  Skin: Skin is warm and dry. No rash noted.   Cardiovascular: Normal heart rate noted  Respiratory: Normal respiratory effort, no problems with respiration noted  Abdomen: Soft, gravid, appropriate for gestational age.  Pain/Pressure: Absent     Pelvic: Cervical exam deferred        Extremities: Normal range of motion.  Edema: None  Mental Status: Normal mood and affect. Normal behavior. Normal judgment and thought content.   Assessment and Plan:  Pregnancy: G2P0101 at [redacted]w[redacted]d  1. Supervision of other normal pregnancy, antepartum - Patient doing well, request no residents or students to be at delivery  - Was a optimized schedule patient, has not been taking BP at home because she left her cuff at mother's house. Discussed she will be changed to normal appointment schedule until she is compliant with Babyscripts app  - AFP, Serum, Open Spina Bifida  2. Heartburn during pregnancy in second trimester -  Reports occasional reflux, was taking Tums but stopped because she does not like taking medication  - Agreed to sending Rx for pepcid and will pick up when needed  - famotidine (PEPCID) 20 MG tablet; Take 1 tablet (20 mg total) by mouth 2 (two) times daily.  Dispense: 60 tablet; Refill: 1  Preterm labor symptoms and general obstetric precautions including but not limited to vaginal bleeding, contractions, leaking of fluid and fetal movement were reviewed in detail with the patient. Please refer to After Visit Summary for other counseling recommendations.  Return in about 4 weeks (around 10/18/2018) for ROB.  Future Appointments  Date Time Provider Department Center  09/21/2018 10:45 AM [facility] [place] [facility]  10/18/2018 11:00 AM Brock Bad, MD [facility] None    Sharyon Cable, CNM

## 2018-09-21 ENCOUNTER — Ambulatory Visit (HOSPITAL_COMMUNITY)
Admission: RE | Admit: 2018-09-21 | Discharge: 2018-09-21 | Disposition: A | Discharge status: Home / Self Care | Payer: Medicaid Other | Source: Ambulatory Visit | Attending: Advanced Practice Midwife | Admitting: Advanced Practice Midwife

## 2018-09-21 DIAGNOSIS — Z3480 Encounter for supervision of other normal pregnancy, unspecified trimester: Secondary | ICD-10-CM

## 2018-09-21 DIAGNOSIS — Z3A18 18 weeks gestation of pregnancy: Secondary | ICD-10-CM | POA: Diagnosis not present

## 2018-09-21 DIAGNOSIS — Z363 Encounter for antenatal screening for malformations: Secondary | ICD-10-CM

## 2018-09-22 LAB — AFP, SERUM, OPEN SPINA BIFIDA
AFP MoM: 1.1
AFP Value: 55.5 ng/mL
Gest. Age on Collection Date: 18.6 weeks
Maternal Age At EDD: 21.3 yr
OSBR Risk 1 IN: 10000
Test Results:: NEGATIVE
Weight: 156 [lb_av]

## 2018-09-24 ENCOUNTER — Other Ambulatory Visit (HOSPITAL_COMMUNITY): Payer: Self-pay | Admitting: *Deleted

## 2018-09-24 DIAGNOSIS — Z362 Encounter for other antenatal screening follow-up: Secondary | ICD-10-CM

## 2018-10-18 ENCOUNTER — Encounter: Payer: Medicaid Other | Admitting: Obstetrics

## 2018-10-18 ENCOUNTER — Telehealth: Payer: Self-pay | Admitting: *Deleted

## 2018-10-18 NOTE — Telephone Encounter (Signed)
Pt called to office with problems with BP cuff and babyscripts. Would like return call for help.   Attempt to return call. Not available, no VM.

## 2018-11-24 ENCOUNTER — Other Ambulatory Visit: Payer: Self-pay | Admitting: Obstetrics

## 2018-11-24 DIAGNOSIS — O219 Vomiting of pregnancy, unspecified: Secondary | ICD-10-CM

## 2018-11-28 NOTE — L&D Delivery Note (Signed)
Patient is a 24 y.o. now G2P1 s/p vaginal delivery of nonviable female at [redacted]w[redacted]d, who was admitted for IOL for IUFD.  She progressed with augmentation (cytotec) to complete. Cord clamped by CNM and cut by Vincent ([name]). Support system of mother of patient and mother of Oswaldo Done at bedside. Placenta intact and spontaneous, multiple large clots with delivery of placenta and around edge of placenta- most likely abruption as cause of IUFD and rapid delivery. Unsure of rupture prior to delivery- no amniotic fluid seen during course of labor and delivery.   Delivery Note At 1:58 PM a non-viable female was delivered via Vaginal, Spontaneous (Presentation: ROA) with one nuchal cord present.  APGAR: 0, 0; weight pending.   Placenta intact and spontaneous, multiple large clots with delivery of placenta and around edge of placenta. 3VCord:  with the following complications: IUFD  Anesthesia:  Epidural Episiotomy: None Lacerations: None Suture Repair: None Est. Blood Loss (mL): 246  Mom to postpartum.  Baby to [facility].  Sharyon Cable CNM 02/11/2019, 2:24 PM

## 2018-11-29 ENCOUNTER — Ambulatory Visit (INDEPENDENT_AMBULATORY_CARE_PROVIDER_SITE_OTHER): Payer: Medicaid Other | Admitting: Obstetrics

## 2018-11-29 ENCOUNTER — Other Ambulatory Visit: Payer: Medicaid Other

## 2018-11-29 ENCOUNTER — Encounter: Payer: Self-pay | Admitting: Obstetrics

## 2018-11-29 VITALS — BP 119/79 | HR 94 | Wt 176.0 lb

## 2018-11-29 DIAGNOSIS — O26892 Other specified pregnancy related conditions, second trimester: Secondary | ICD-10-CM

## 2018-11-29 DIAGNOSIS — Z23 Encounter for immunization: Secondary | ICD-10-CM

## 2018-11-29 DIAGNOSIS — Z3483 Encounter for supervision of other normal pregnancy, third trimester: Secondary | ICD-10-CM

## 2018-11-29 DIAGNOSIS — O26893 Other specified pregnancy related conditions, third trimester: Secondary | ICD-10-CM

## 2018-11-29 DIAGNOSIS — Z3480 Encounter for supervision of other normal pregnancy, unspecified trimester: Secondary | ICD-10-CM

## 2018-11-29 DIAGNOSIS — R12 Heartburn: Secondary | ICD-10-CM

## 2018-11-29 MED ORDER — OMEPRAZOLE 20 MG PO CPDR: 20.0000 mg | DELAYED_RELEASE_CAPSULE | Freq: Two times a day (BID) | ORAL | 5 refills | Status: DC

## 2018-11-29 NOTE — Progress Notes (Signed)
Subjective:  Adriana [name] is a 23 y.o. G2P0101 at [redacted]w[redacted]d being seen today for ongoing prenatal care.  She is currently monitored for the following issues for this low-risk pregnancy and has Migraine headache with aura; Supervision of other normal pregnancy, antepartum; and Heartburn during pregnancy in second trimester on their problem list.  Patient reports heartburn.  Contractions: Not present. Vag. Bleeding: None.  Movement: Present. Denies leaking of fluid.   The following portions of the patient's history were reviewed and updated as appropriate: allergies, current medications, past family history, past medical history, past social history, past surgical history and problem list. Problem list updated.  Objective:   Vitals:   11/29/18 0820  BP: 119/79  Pulse: 94  Weight: 176 lb (79.8 kg)    Fetal Status:     Movement: Present     General:  Alert, oriented and cooperative. Patient is in no acute distress.  Skin: Skin is warm and dry. No rash noted.   Cardiovascular: Normal heart rate noted  Respiratory: Normal respiratory effort, no problems with respiration noted  Abdomen: Soft, gravid, appropriate for gestational age. Pain/Pressure: Absent     Pelvic:  Cervical exam deferred        Extremities: Normal range of motion.     Mental Status: Normal mood and affect. Normal behavior. Normal judgment and thought content.   Urinalysis:      Assessment and Plan:  Pregnancy: G2P0101 at [redacted]w[redacted]d  1. Supervision of other normal pregnancy, antepartum Rx: - Glucose Tolerance, 2 Hours w/1 Hour - CBC - RPR - HIV Antibody (routine testing w rflx) - Tdap vaccine greater than or equal to 7yo IM  2. Heartburn during pregnancy in second trimester Rx: - omeprazole (PRILOSEC) 20 MG capsule; Take 1 capsule (20 mg total) by mouth 2 (two) times daily before a meal.  Dispense: 60 capsule; Refill: 5   Preterm labor symptoms and general obstetric precautions including but not limited to vaginal  bleeding, contractions, leaking of fluid and fetal movement were reviewed in detail with the patient. Please refer to After Visit Summary for other counseling recommendations.  Return in about 2 weeks (around 12/13/2018) for ROB.   Brock Bad, MD

## 2018-11-30 ENCOUNTER — Encounter (HOSPITAL_COMMUNITY): Payer: Self-pay

## 2018-11-30 ENCOUNTER — Ambulatory Visit (HOSPITAL_COMMUNITY)
Admission: RE | Admit: 2018-11-30 | Discharge: 2018-11-30 | Disposition: A | Discharge status: Home / Self Care | Payer: Medicaid Other | Source: Ambulatory Visit | Attending: Advanced Practice Midwife | Admitting: Advanced Practice Midwife

## 2018-11-30 LAB — RPR: RPR: NONREACTIVE

## 2018-11-30 LAB — CBC
Hematocrit: 31.7 % — ABNORMAL LOW (ref 34.0–46.6)
Hemoglobin: 10.7 g/dL — ABNORMAL LOW (ref 11.1–15.9)
MCH: 33 pg (ref 26.6–33.0)
MCHC: 33.8 g/dL (ref 31.5–35.7)
MCV: 98 fL — ABNORMAL HIGH (ref 79–97)
Platelets: 305 10*3/uL (ref 150–450)
RBC: 3.24 x10E6/uL — ABNORMAL LOW (ref 3.77–5.28)
RDW: 11.8 % — ABNORMAL LOW (ref 12.3–15.4)
WBC: 7.2 10*3/uL (ref 3.4–10.8)

## 2018-11-30 LAB — GLUCOSE TOLERANCE, 2 HOURS W/ 1HR
GLUCOSE, 2 HOUR: 93 mg/dL (ref 65–152)
Glucose, 1 hour: 110 mg/dL (ref 65–179)
Glucose, Fasting: 78 mg/dL (ref 65–91)

## 2018-11-30 LAB — HIV ANTIBODY (ROUTINE TESTING W REFLEX): HIV Screen 4th Generation wRfx: NONREACTIVE

## 2018-12-03 ENCOUNTER — Other Ambulatory Visit: Payer: Self-pay | Admitting: Obstetrics

## 2018-12-03 DIAGNOSIS — O99019 Anemia complicating pregnancy, unspecified trimester: Secondary | ICD-10-CM

## 2018-12-03 MED ORDER — FERROUS SULFATE DRIED ER 160 (50 FE) MG PO TBCR: 1.0000 | EXTENDED_RELEASE_TABLET | Freq: Every day | ORAL | 5 refills | Status: DC

## 2018-12-12 ENCOUNTER — Other Ambulatory Visit (HOSPITAL_COMMUNITY): Payer: Self-pay | Admitting: Obstetrics and Gynecology

## 2018-12-12 ENCOUNTER — Ambulatory Visit (HOSPITAL_COMMUNITY)
Admission: RE | Admit: 2018-12-12 | Discharge: 2018-12-12 | Disposition: A | Discharge status: Home / Self Care | Payer: Medicaid Other | Source: Ambulatory Visit | Attending: Obstetrics and Gynecology | Admitting: Obstetrics and Gynecology

## 2018-12-12 DIAGNOSIS — O4443 Low lying placenta NOS or without hemorrhage, third trimester: Secondary | ICD-10-CM | POA: Diagnosis not present

## 2018-12-12 DIAGNOSIS — Z362 Encounter for other antenatal screening follow-up: Secondary | ICD-10-CM | POA: Insufficient documentation

## 2018-12-12 DIAGNOSIS — Z3A30 30 weeks gestation of pregnancy: Secondary | ICD-10-CM | POA: Diagnosis not present

## 2018-12-13 ENCOUNTER — Encounter: Payer: Medicaid Other | Admitting: Obstetrics

## 2018-12-18 ENCOUNTER — Other Ambulatory Visit (HOSPITAL_COMMUNITY)
Admission: RE | Admit: 2018-12-18 | Discharge: 2018-12-18 | Disposition: A | Discharge status: Home / Self Care | Payer: Medicaid Other | Source: Ambulatory Visit | Attending: Obstetrics | Admitting: Obstetrics

## 2018-12-18 ENCOUNTER — Ambulatory Visit (INDEPENDENT_AMBULATORY_CARE_PROVIDER_SITE_OTHER): Payer: Medicaid Other | Admitting: Obstetrics & Gynecology

## 2018-12-18 VITALS — BP 124/85 | HR 87 | Wt 182.8 lb

## 2018-12-18 DIAGNOSIS — O23593 Infection of other part of genital tract in pregnancy, third trimester: Secondary | ICD-10-CM | POA: Diagnosis present

## 2018-12-18 DIAGNOSIS — N760 Acute vaginitis: Secondary | ICD-10-CM

## 2018-12-18 DIAGNOSIS — Z3A31 31 weeks gestation of pregnancy: Secondary | ICD-10-CM

## 2018-12-18 DIAGNOSIS — Z3483 Encounter for supervision of other normal pregnancy, third trimester: Secondary | ICD-10-CM

## 2018-12-18 DIAGNOSIS — O219 Vomiting of pregnancy, unspecified: Secondary | ICD-10-CM

## 2018-12-18 DIAGNOSIS — Z3480 Encounter for supervision of other normal pregnancy, unspecified trimester: Secondary | ICD-10-CM

## 2018-12-18 MED ORDER — PROMETHAZINE HCL 25 MG PO TABS: 25.0000 mg | ORAL_TABLET | Freq: Four times a day (QID) | ORAL | 1 refills | Status: DC | PRN

## 2018-12-18 NOTE — Progress Notes (Signed)
Pt is here for ROB. [redacted]w[redacted]d. Pt has c/o white/clear discharge with a foul smell.

## 2018-12-18 NOTE — Progress Notes (Signed)
   PRENATAL VISIT NOTE  Subjective:  Adriana [name] is a 23 y.o. G2P0101 at [redacted]w[redacted]d being seen today for ongoing prenatal care.  She is currently monitored for the following issues for this low-risk pregnancy and has Migraine headache with aura; Supervision of other normal pregnancy, antepartum; and Heartburn during pregnancy in second trimester on their problem list.  Patient reports vaginal irritation x 1 day, wants to be evaluated. Also wants refill of Promethazine for nausea, on Prilosec for heartburn already.  Contractions: Not present. Vag. Bleeding: None.  Movement: Present. Denies leaking of fluid.   The following portions of the patient's history were reviewed and updated as appropriate: allergies, current medications, past family history, past medical history, past social history, past surgical history and problem list. Problem list updated.  Objective:   Vitals:   12/18/18 1128  BP: 124/85  Pulse: 87  Weight: 182 lb 12.8 oz (82.9 kg)    Fetal Status: Fetal Heart Rate (bpm): 150 Fundal Height: 31 cm Movement: Present     General:  Alert, oriented and cooperative. Patient is in no acute distress.  Skin: Skin is warm and dry. No rash noted.   Cardiovascular: Normal heart rate noted  Respiratory: Normal respiratory effort, no problems with respiration noted  Abdomen: Soft, gravid, appropriate for gestational age.  Pain/Pressure: Absent     Pelvic: Cervical exam deferred       Thin, white discharge seen, sample obtained.  Extremities: Normal range of motion.  Edema: None  Mental Status: Normal mood and affect. Normal behavior. Normal judgment and thought content.   Assessment and Plan:  Pregnancy: G2P0101 at [redacted]w[redacted]d  1. Vaginitis affecting pregnancy in third trimester, antepartum - Cervicovaginal ancillary only( [facility]) done, will follow up results and manage accordingly.  2. Nausea and vomiting during pregnancy - promethazine (PHENERGAN) 25 MG tablet; Take 1 tablet (25 mg  total) by mouth every 6 (six) hours as needed for nausea or vomiting.  Dispense: 30 tablet; Refill: 1  3. Supervision of other normal pregnancy, antepartum Preterm labor symptoms and general obstetric precautions including but not limited to vaginal bleeding, contractions, leaking of fluid and fetal movement were reviewed in detail with the patient. Please refer to After Visit Summary for other counseling recommendations.  Return in about 2 weeks (around 01/01/2019) for OB Visit.  No future appointments.  Jaynie Collins, MD

## 2018-12-18 NOTE — Patient Instructions (Signed)
Return to clinic for any scheduled appointments or obstetric concerns, or go to [facility] for evaluation  

## 2018-12-19 LAB — CERVICOVAGINAL ANCILLARY ONLY
Bacterial vaginitis: POSITIVE — AB
Candida vaginitis: POSITIVE — AB
Chlamydia: NEGATIVE
Neisseria Gonorrhea: NEGATIVE
Trichomonas: NEGATIVE

## 2018-12-20 MED ORDER — TERCONAZOLE 0.8 % VA CREA: 1.0000 | TOPICAL_CREAM | Freq: Every day | VAGINAL | 0 refills | Status: DC

## 2018-12-20 MED ORDER — METRONIDAZOLE 500 MG PO TABS: 500.0000 mg | ORAL_TABLET | Freq: Two times a day (BID) | ORAL | 0 refills | Status: DC

## 2018-12-20 NOTE — Addendum Note (Signed)
Addended by: Jaynie Collins A on: 12/20/2018 12:13 PM   Modules accepted: Orders

## 2019-01-01 ENCOUNTER — Ambulatory Visit (INDEPENDENT_AMBULATORY_CARE_PROVIDER_SITE_OTHER): Payer: Medicaid Other | Admitting: Advanced Practice Midwife

## 2019-01-01 VITALS — BP 113/74 | HR 94 | Wt 184.4 lb

## 2019-01-01 DIAGNOSIS — N760 Acute vaginitis: Secondary | ICD-10-CM

## 2019-01-01 DIAGNOSIS — O23593 Infection of other part of genital tract in pregnancy, third trimester: Secondary | ICD-10-CM

## 2019-01-01 DIAGNOSIS — O99019 Anemia complicating pregnancy, unspecified trimester: Secondary | ICD-10-CM

## 2019-01-01 DIAGNOSIS — O99013 Anemia complicating pregnancy, third trimester: Secondary | ICD-10-CM

## 2019-01-01 DIAGNOSIS — Z3480 Encounter for supervision of other normal pregnancy, unspecified trimester: Secondary | ICD-10-CM

## 2019-01-01 DIAGNOSIS — Z3483 Encounter for supervision of other normal pregnancy, third trimester: Secondary | ICD-10-CM

## 2019-01-01 MED ORDER — TERCONAZOLE 0.4 % VA CREA: 1.0000 | TOPICAL_CREAM | Freq: Every day | VAGINAL | 1 refills | Status: DC

## 2019-01-01 MED ORDER — FERROUS SULFATE 325 (65 FE) MG PO TABS: 325.0000 mg | ORAL_TABLET | Freq: Every day | ORAL | 5 refills | Status: DC

## 2019-01-01 NOTE — Patient Instructions (Signed)
Vaginal Yeast infection, Adult    Vaginal yeast infection is a condition that causes vaginal discharge as well as soreness, swelling, and redness (inflammation) of the vagina. This is a common condition. Some women get this infection frequently.  What are the causes?  This condition is caused by a change in the normal balance of the yeast (candida) and bacteria that live in the vagina. This change causes an overgrowth of yeast, which causes the inflammation.  What increases the risk?  The condition is more likely to develop in women who:   Take antibiotic medicines.   Have diabetes.   Take birth control pills.   Are pregnant.   Douche often.   Have a weak body defense system (immune system).   Have been taking steroid medicines for a long time.   Frequently wear tight clothing.  What are the signs or symptoms?  Symptoms of this condition include:   White, thick, creamy vaginal discharge.   Swelling, itching, redness, and irritation of the vagina. The lips of the vagina (vulva) may be affected as well.   Pain or a burning feeling while urinating.   Pain during sex.  How is this diagnosed?  This condition is diagnosed based on:   Your medical history.   A physical exam.   A pelvic exam. Your health care provider will examine a sample of your vaginal discharge under a microscope. Your health care provider may send this sample for testing to confirm the diagnosis.  How is this treated?  This condition is treated with medicine. Medicines may be over-the-counter or prescription. You may be told to use one or more of the following:   Medicine that is taken by mouth (orally).   Medicine that is applied as a cream (topically).   Medicine that is inserted directly into the vagina (suppository).  Follow these instructions at home:    Lifestyle   Do not have sex until your health care provider approves. Tell your sex partner that you have a yeast infection. That person should go to his or her health care  provider and ask if they should also be treated.   Do not wear tight clothes, such as pantyhose or tight pants.   Wear breathable cotton underwear.  General instructions   Take or apply over-the-counter and prescription medicines only as told by your health care provider.   Eat more yogurt. This may help to keep your yeast infection from returning.   Do not use tampons until your health care provider approves.   Try taking a sitz bath to help with discomfort. This is a warm water bath that is taken while you are sitting down. The water should only come up to your hips and should cover your buttocks. Do this 3-4 times per day or as told by your health care provider.   Do not douche.   If you have diabetes, keep your blood sugar levels under control.   Keep all follow-up visits as told by your health care provider. This is important.  Contact a health care provider if:   You have a fever.   Your symptoms go away and then return.   Your symptoms do not get better with treatment.   Your symptoms get worse.   You have new symptoms.   You develop blisters in or around your vagina.   You have blood coming from your vagina and it is not your menstrual period.   You develop pain in your abdomen.  Summary     Vaginal yeast infection is a condition that causes discharge as well as soreness, swelling, and redness (inflammation) of the vagina.   This condition is treated with medicine. Medicines may be over-the-counter or prescription.   Take or apply over-the-counter and prescription medicines only as told by your health care provider.   Do not douche. Do not have sex or use tampons until your health care provider approves.   Contact a health care provider if your symptoms do not get better with treatment or your symptoms go away and then return.  This information is not intended to replace advice given to you by your health care provider. Make sure you discuss any questions you have with your health care  provider.  Document Released: 08/24/2005 Document Revised: 04/02/2018 Document Reviewed: 04/02/2018  Elsevier Interactive Patient Education  2019 [employer]

## 2019-01-01 NOTE — Progress Notes (Signed)
   PRENATAL VISIT NOTE  Subjective:  Adriana [name] is a 23 y.o. G2P0101 at 5110w2d being seen today for ongoing prenatal care.  She is currently monitored for the following issues for this low-risk pregnancy and has Migraine headache with aura; Supervision of other normal pregnancy, antepartum; and Heartburn during pregnancy in second trimester on their problem list.  Patient reports vaginal irritation.  Contractions: Not present. Vag. Bleeding: None.  Movement: Present. Denies leaking of fluid.   The following portions of the patient's history were reviewed and updated as appropriate: allergies, current medications, past family history, past medical history, past social history, past surgical history and problem list. Problem list updated.  Objective:   Vitals:   01/01/19 1128  BP: 113/74  Pulse: 94  Weight: 83.6 kg    Fetal Status:     Movement: Present     General:  Alert, oriented and cooperative. Patient is in no acute distress.  Skin: Skin is warm and dry. No rash noted.   Cardiovascular: Normal heart rate noted  Respiratory: Normal respiratory effort, no problems with respiration noted  Abdomen: Soft, gravid, appropriate for gestational age.  Pain/Pressure: Absent     Pelvic: Cervical exam deferred        Extremities: Normal range of motion.  Edema: None  Mental Status: Normal mood and affect. Normal behavior. Normal judgment and thought content.   Assessment and Plan:  Pregnancy: G2P0101 at 610w2d  1. Supervision of other normal pregnancy, antepartum --Anticipatory guidance about next visits/weeks of pregnancy given.   2. Vaginitis affecting pregnancy in third trimester, antepartum --Pt used Terazol but completed Flagyl course at the same time. She thinks her yeast infection returned after the abx. - terconazole (TERAZOL 7) 0.4 % vaginal cream; Place 1 applicator vaginally at bedtime.  Dispense: 45 g; Refill: 1  3. Anemia affecting pregnancy, antepartum --Slow Fe iron  is too expensive, insurance did not cover. - ferrous sulfate (FERROUSUL) 325 (65 FE) MG tablet; Take 1 tablet (325 mg total) by mouth daily with breakfast.  Dispense: 30 tablet; Refill: 5  Preterm labor symptoms and general obstetric precautions including but not limited to vaginal bleeding, contractions, leaking of fluid and fetal movement were reviewed in detail with the patient. Please refer to After Visit Summary for other counseling recommendations.  No follow-ups on file.  No future appointments.  Sharen CounterLisa Leftwich-Kirby, CNM

## 2019-01-01 NOTE — Progress Notes (Signed)
Pt presents for ROB. She needs another rx for yeast.

## 2019-01-15 ENCOUNTER — Ambulatory Visit (INDEPENDENT_AMBULATORY_CARE_PROVIDER_SITE_OTHER): Payer: Medicaid Other | Admitting: Advanced Practice Midwife

## 2019-01-15 VITALS — BP 118/81 | HR 96 | Wt 188.0 lb

## 2019-01-15 DIAGNOSIS — O99013 Anemia complicating pregnancy, third trimester: Secondary | ICD-10-CM

## 2019-01-15 DIAGNOSIS — Z3480 Encounter for supervision of other normal pregnancy, unspecified trimester: Secondary | ICD-10-CM

## 2019-01-15 DIAGNOSIS — N760 Acute vaginitis: Secondary | ICD-10-CM

## 2019-01-15 DIAGNOSIS — O23593 Infection of other part of genital tract in pregnancy, third trimester: Secondary | ICD-10-CM

## 2019-01-15 DIAGNOSIS — Z3483 Encounter for supervision of other normal pregnancy, third trimester: Secondary | ICD-10-CM

## 2019-01-15 DIAGNOSIS — Z3A35 35 weeks gestation of pregnancy: Secondary | ICD-10-CM

## 2019-01-15 DIAGNOSIS — O99019 Anemia complicating pregnancy, unspecified trimester: Secondary | ICD-10-CM

## 2019-01-15 NOTE — Progress Notes (Addendum)
   PRENATAL VISIT NOTE  Subjective:  Adriana [name] is a 23 y.o. G2P0101 at [redacted]w[redacted]d being seen today for ongoing prenatal care.  She is currently monitored for the following issues for this low-risk pregnancy and has Migraine headache with aura; Supervision of other normal pregnancy, antepartum; and Heartburn during pregnancy in second trimester on their problem list.  Patient reports no complaints.  Contractions: Irritability. Vag. Bleeding: None.  Movement: Present. Denies leaking of fluid.   The following portions of the patient's history were reviewed and updated as appropriate: allergies, current medications, past family history, past medical history, past social history, past surgical history and problem list. Problem list updated.  Objective:   Vitals:   01/15/19 1127  BP: 118/81  Pulse: 96  Weight: 85.3 kg    Fetal Status: Fetal Heart Rate (bpm): 150 Fundal Height: 34 cm Movement: Present     General:  Alert, oriented and cooperative. Patient is in no acute distress.  Skin: Skin is warm and dry. No rash noted.   Cardiovascular: Normal heart rate noted  Respiratory: Normal respiratory effort, no problems with respiration noted  Abdomen: Soft, gravid, appropriate for gestational age.  Pain/Pressure: Absent     Pelvic: Cervical exam deferred        Extremities: Normal range of motion.     Mental Status: Normal mood and affect. Normal behavior. Normal judgment and thought content.   Assessment and Plan:  Pregnancy: G2P0101 at [redacted]w[redacted]d  1. Supervision of other normal pregnancy, antepartum --Anticipatory guidance about next visits/weeks of pregnancy given.   2. Vaginitis affecting pregnancy in third trimester, antepartum --Improved since last visit, did not complete Terazol, recommend completing course.  3. Anemia affecting pregnancy, antepartum --No symptoms, taking daily iron.  No constipation.  Preterm labor symptoms and general obstetric precautions including but not  limited to vaginal bleeding, contractions, leaking of fluid and fetal movement were reviewed in detail with the patient. Please refer to After Visit Summary for other counseling recommendations.  Return in about 2 weeks (around 01/29/2019).  No future appointments.  Sharen Counter, CNM

## 2019-01-15 NOTE — Patient Instructions (Signed)
Third Trimester of Pregnancy The third trimester is from week 28 through week 40 (months 7 through 9). The third trimester is a time when the unborn baby (fetus) is growing rapidly. At the end of the ninth month, the fetus is about 20 inches in length and weighs 6-10 pounds. Body changes during your third trimester Your body will continue to go through many changes during pregnancy. The changes vary from woman to woman. During the third trimester:  Your weight will continue to increase. You can expect to gain 25-35 pounds (11-16 kg) by the end of the pregnancy.  You may begin to get stretch marks on your hips, abdomen, and breasts.  You may urinate more often because the fetus is moving lower into your pelvis and pressing on your bladder.  You may develop or continue to have heartburn. This is caused by increased hormones that slow down muscles in the digestive tract.  You may develop or continue to have constipation because increased hormones slow digestion and cause the muscles that push waste through your intestines to relax.  You may develop hemorrhoids. These are swollen veins (varicose veins) in the rectum that can itch or be painful.  You may develop swollen, bulging veins (varicose veins) in your legs.  You may have increased body aches in the pelvis, back, or thighs. This is due to weight gain and increased hormones that are relaxing your joints.  You may have changes in your hair. These can include thickening of your hair, rapid growth, and changes in texture. Some women also have hair loss during or after pregnancy, or hair that feels dry or thin. Your hair will most likely return to normal after your baby is born.  Your breasts will continue to grow and they will continue to become tender. A yellow fluid (colostrum) may leak from your breasts. This is the first milk you are producing for your baby.  Your belly button may stick out.  You may notice more swelling in your hands,  face, or ankles.  You may have increased tingling or numbness in your hands, arms, and legs. The skin on your belly may also feel numb.  You may feel short of breath because of your expanding uterus.  You may have more problems sleeping. This can be caused by the size of your belly, increased need to urinate, and an increase in your body's metabolism.  You may notice the fetus "dropping," or moving lower in your abdomen (lightening).  You may have increased vaginal discharge.  You may notice your joints feel loose and you may have pain around your pelvic bone. What to expect at prenatal visits You will have prenatal exams every 2 weeks until week 36. Then you will have weekly prenatal exams. During a routine prenatal visit:  You will be weighed to make sure you and the baby are growing normally.  Your blood pressure will be taken.  Your abdomen will be measured to track your baby's growth.  The fetal heartbeat will be listened to.  Any test results from the previous visit will be discussed.  You may have a cervical check near your due date to see if your cervix has softened or thinned (effaced).  You will be tested for Group B streptococcus. This happens between 35 and 37 weeks. Your health care provider may ask you:  What your birth plan is.  How you are feeling.  If you are feeling the baby move.  If you have had any abnormal   symptoms, such as leaking fluid, bleeding, severe headaches, or abdominal cramping.  If you are using any tobacco products, including cigarettes, chewing tobacco, and electronic cigarettes.  If you have any questions. Other tests or screenings that may be performed during your third trimester include:  Blood tests that check for low iron levels (anemia).  Fetal testing to check the health, activity level, and growth of the fetus. Testing is done if you have certain medical conditions or if there are problems during the pregnancy.  Nonstress test  (NST). This test checks the health of your baby to make sure there are no signs of problems, such as the baby not getting enough oxygen. During this test, a belt is placed around your belly. The baby is made to move, and its heart rate is monitored during movement. What is false labor? False labor is a condition in which you feel small, irregular tightenings of the muscles in the womb (contractions) that usually go away with rest, changing position, or drinking water. These are called Braxton Hicks contractions. Contractions may last for hours, days, or even weeks before true labor sets in. If contractions come at regular intervals, become more frequent, increase in intensity, or become painful, you should see your health care provider. What are the signs of labor?  Abdominal cramps.  Regular contractions that start at 10 minutes apart and become stronger and more frequent with time.  Contractions that start on the top of the uterus and spread down to the lower abdomen and back.  Increased pelvic pressure and dull back pain.  A watery or bloody mucus discharge that comes from the vagina.  Leaking of amniotic fluid. This is also known as your "water breaking." It could be a slow trickle or a gush. Let your health care provider know if it has a color or strange odor. If you have any of these signs, call your health care provider right away, even if it is before your due date. Follow these instructions at home: Medicines  Follow your health care provider's instructions regarding medicine use. Specific medicines may be either safe or unsafe to take during pregnancy.  Take a prenatal vitamin that contains at least 600 micrograms (mcg) of folic acid.  If you develop constipation, try taking a stool softener if your health care provider approves. Eating and drinking   Eat a balanced diet that includes fresh fruits and vegetables, whole grains, good sources of protein such as meat, eggs, or tofu,  and low-fat dairy. Your health care provider will help you determine the amount of weight gain that is right for you.  Avoid raw meat and uncooked cheese. These carry germs that can cause birth defects in the baby.  If you have low calcium intake from food, talk to your health care provider about whether you should take a daily calcium supplement.  Eat four or five small meals rather than three large meals a day.  Limit foods that are high in fat and processed sugars, such as fried and sweet foods.  To prevent constipation: ? Drink enough fluid to keep your urine clear or pale yellow. ? Eat foods that are high in fiber, such as fresh fruits and vegetables, whole grains, and beans. Activity  Exercise only as directed by your health care provider. Most women can continue their usual exercise routine during pregnancy. Try to exercise for 30 minutes at least 5 days a week. Stop exercising if you experience uterine contractions.  Avoid heavy lifting.  Do   not exercise in extreme heat or humidity, or at high altitudes.  Wear low-heel, comfortable shoes.  Practice good posture.  You may continue to have sex unless your health care provider tells you otherwise. Relieving pain and discomfort  Take frequent breaks and rest with your legs elevated if you have leg cramps or low back pain.  Take warm sitz baths to soothe any pain or discomfort caused by hemorrhoids. Use hemorrhoid cream if your health care provider approves.  Wear a good support bra to prevent discomfort from breast tenderness.  If you develop varicose veins: ? Wear support pantyhose or compression stockings as told by your healthcare provider. ? Elevate your feet for 15 minutes, 3-4 times a day. Prenatal care  Write down your questions. Take them to your prenatal visits.  Keep all your prenatal visits as told by your health care provider. This is important. Safety  Wear your seat belt at all times when driving.  Make  a list of emergency phone numbers, including numbers for family, friends, the hospital, and police and fire departments. General instructions  Avoid cat litter boxes and soil used by cats. These carry germs that can cause birth defects in the baby. If you have a cat, ask someone to clean the litter box for you.  Do not travel far distances unless it is absolutely necessary and only with the approval of your health care provider.  Do not use hot tubs, steam rooms, or saunas.  Do not drink alcohol.  Do not use any products that contain nicotine or tobacco, such as cigarettes and e-cigarettes. If you need help quitting, ask your health care provider.  Do not use any medicinal herbs or unprescribed drugs. These chemicals affect the formation and growth of the baby.  Do not douche or use tampons or scented sanitary pads.  Do not cross your legs for long periods of time.  To prepare for the arrival of your baby: ? Take prenatal classes to understand, practice, and ask questions about labor and delivery. ? Make a trial run to the hospital. ? Visit the hospital and tour the maternity area. ? Arrange for maternity or paternity leave through employers. ? Arrange for family and friends to take care of pets while you are in the hospital. ? Purchase a rear-facing car seat and make sure you know how to install it in your car. ? Pack your hospital bag. ? Prepare the baby's nursery. Make sure to remove all pillows and stuffed animals from the baby's crib to prevent suffocation.  Visit your dentist if you have not gone during your pregnancy. Use a soft toothbrush to brush your teeth and be gentle when you floss. Contact a health care provider if:  You are unsure if you are in labor or if your water has broken.  You become dizzy.  You have mild pelvic cramps, pelvic pressure, or nagging pain in your abdominal area.  You have lower back pain.  You have persistent nausea, vomiting, or  diarrhea.  You have an unusual or bad smelling vaginal discharge.  You have pain when you urinate. Get help right away if:  Your water breaks before 37 weeks.  You have regular contractions less than 5 minutes apart before 37 weeks.  You have a fever.  You are leaking fluid from your vagina.  You have spotting or bleeding from your vagina.  You have severe abdominal pain or cramping.  You have rapid weight loss or weight gain.  You have   shortness of breath with chest pain.  You notice sudden or extreme swelling of your face, hands, ankles, feet, or legs.  Your baby makes fewer than 10 movements in 2 hours.  You have severe headaches that do not go away when you take medicine.  You have vision changes. Summary  The third trimester is from week 28 through week 40, months 7 through 9. The third trimester is a time when the unborn baby (fetus) is growing rapidly.  During the third trimester, your discomfort may increase as you and your baby continue to gain weight. You may have abdominal, leg, and back pain, sleeping problems, and an increased need to urinate.  During the third trimester your breasts will keep growing and they will continue to become tender. A yellow fluid (colostrum) may leak from your breasts. This is the first milk you are producing for your baby.  False labor is a condition in which you feel small, irregular tightenings of the muscles in the womb (contractions) that eventually go away. These are called Braxton Hicks contractions. Contractions may last for hours, days, or even weeks before true labor sets in.  Signs of labor can include: abdominal cramps; regular contractions that start at 10 minutes apart and become stronger and more frequent with time; watery or bloody mucus discharge that comes from the vagina; increased pelvic pressure and dull back pain; and leaking of amniotic fluid. This information is not intended to replace advice given to you by your  health care provider. Make sure you discuss any questions you have with your health care provider. Document Released: 11/08/2001 Document Revised: 12/20/2016 Document Reviewed: 12/20/2016 Elsevier Interactive Patient Education  2019 [employer]  

## 2019-01-29 ENCOUNTER — Ambulatory Visit (INDEPENDENT_AMBULATORY_CARE_PROVIDER_SITE_OTHER): Payer: Medicaid Other | Admitting: Advanced Practice Midwife

## 2019-01-29 ENCOUNTER — Other Ambulatory Visit (HOSPITAL_COMMUNITY)
Admission: RE | Admit: 2019-01-29 | Discharge: 2019-01-29 | Disposition: A | Discharge status: Home / Self Care | Payer: Medicaid Other | Source: Ambulatory Visit | Attending: Advanced Practice Midwife | Admitting: Advanced Practice Midwife

## 2019-01-29 VITALS — BP 120/80 | HR 96 | Wt 189.4 lb

## 2019-01-29 DIAGNOSIS — Z3480 Encounter for supervision of other normal pregnancy, unspecified trimester: Secondary | ICD-10-CM | POA: Diagnosis present

## 2019-01-29 DIAGNOSIS — Z3483 Encounter for supervision of other normal pregnancy, third trimester: Secondary | ICD-10-CM

## 2019-01-29 DIAGNOSIS — Z3A37 37 weeks gestation of pregnancy: Secondary | ICD-10-CM

## 2019-01-29 LAB — OB RESULTS CONSOLE GBS: GBS: NEGATIVE

## 2019-01-29 LAB — OB RESULTS CONSOLE GC/CHLAMYDIA: Gonorrhea: NEGATIVE

## 2019-01-29 MED ORDER — FLUCONAZOLE 150 MG PO TABS: ORAL_TABLET | ORAL | 0 refills | Status: DC

## 2019-01-29 NOTE — Progress Notes (Addendum)
   PRENATAL VISIT NOTE  Subjective:  Adriana [name] is a 23 y.o. G2P0101 at [redacted]w[redacted]d being seen today for ongoing prenatal care.  She is currently monitored for the following issues for this low-risk pregnancy and has Migraine headache with aura; Supervision of other normal pregnancy, antepartum; and Heartburn during pregnancy in second trimester on their problem list.  Patient reports no complaints.  Contractions: Irritability. Vag. Bleeding: None.  Movement: Present. Denies leaking of fluid.   The following portions of the patient's history were reviewed and updated as appropriate: allergies, current medications, past family history, past medical history, past social history, past surgical history and problem list. Problem list updated.  Objective:   Vitals:   01/29/19 1131  BP: 120/80  Pulse: 96  Weight: 85.9 kg    Fetal Status: Fetal Heart Rate (bpm): 148 Fundal Height: 37 cm Movement: Present  Presentation: Vertex  General:  Alert, oriented and cooperative. Patient is in no acute distress.  Skin: Skin is warm and dry. No rash noted.   Cardiovascular: Normal heart rate noted  Respiratory: Normal respiratory effort, no problems with respiration noted  Abdomen: Soft, gravid, appropriate for gestational age.  Pain/Pressure: Absent     Pelvic: Cervical exam performed Dilation: 1 Effacement (%): 50 Station: -3  Extremities: Normal range of motion.  Edema: None  Mental Status: Normal mood and affect. Normal behavior. Normal judgment and thought content.   Assessment and Plan:  Pregnancy: G2P0101 at [redacted]w[redacted]d  1. Supervision of other normal pregnancy, antepartum --Anticipatory guidance about next visits/weeks of pregnancy given.  2.  Vaginal candidiasis --Symptoms have returned after ;7 day course of Terazol.   --Rx for Diflucan   Term labor symptoms and general obstetric precautions including but not limited to vaginal bleeding, contractions, leaking of fluid and fetal movement were  reviewed in detail with the patient. Please refer to After Visit Summary for other counseling recommendations.  No follow-ups on file.  Future Appointments  Date Time Provider Department Center  02/05/2019  9:00 AM Gerrit Heck, CNM [facility] None    Sharen Counter, CNM

## 2019-01-29 NOTE — Patient Instructions (Signed)
Labor Precautions Reasons to come to [facility]:  1.  Contractions are  5 minutes apart or less, each last 1 minute, these have been going on for 1-2 hours, and you cannot walk or talk during them 2.  You have a large gush of fluid, or a trickle of fluid that will not stop and you have to wear a pad 3.  You have bleeding that is bright red, heavier than spotting--like menstrual bleeding (spotting can be normal in early labor or after a check of your cervix) 4.  You do not feel the baby moving like he/she normally does  Pain Relief During Labor and Delivery Many things can cause pain during labor and delivery, including:  Pressure on bones and ligaments due to the baby moving through the pelvis.  Stretching of tissues due to the baby moving through the birth canal.  Muscle tension due to anxiety or nervousness.  The uterus tightening (contracting) and relaxing to help move the baby. There are many ways to deal with the pain of labor and delivery. They include:  Taking prenatal classes. Taking these classes helps you know what to expect during your baby's birth. What you learn will increase your confidence and decrease your anxiety.  Practicing relaxation techniques or doing relaxing activities, such as: ? Focused breathing. ? Meditation. ? Visualization. ? Aroma therapy. ? Listening to your favorite music. ? Hypnosis.  Taking a warm shower or bath (hydrotherapy). This may: ? Provide comfort and relaxation. ? Lessen your perception of pain. ? Decrease the amount of pain medicine needed. ? Decrease the length of labor.  Getting a massage or counterpressure on your back.  Applying warm packs or ice packs.  Changing positions often, moving around, or using a birthing ball.  Getting: ? Pain medicine through an IV or injection into a muscle. ? Pain medicine inserted into your spinal column. ? Injections of sterile water just under the skin on your lower back (intradermal  injections). ? Laughing gas (nitrous oxide). Discuss your pain control options with your health care provider during your prenatal visits. Explore the options offered by your hospital or birth center. What kinds of medicine are available? There are two kinds of medicines that can be used to relieve pain during labor and delivery:  Analgesics. These medicines decrease pain without causing you to lose feeling or the ability to move your muscles.  Anesthetics. These medicines block feeling in the body and can decrease your ability to move freely. Both of these kinds of medicine can cause minor side effects, such as nausea, trouble concentrating, and sleepiness. They can also decrease the baby's heart rate before birth and affect the baby's breathing rate after birth. For this reason, health care providers are careful about when and how much medicine is given. What are specific medicines and procedures that provide pain relief? Local Anesthetics Local anesthetics are used to numb a small area of the body. They may be used along with another kind of anesthetic or used to numb the nerves of the vagina, cervix, and perineum during the second stage of labor. General Anesthetics General anesthetics cause you to lose consciousness so you do not feel pain. They are usually only used for an emergency cesarean delivery. General anesthetics are given through an IV tube and a mask. Pudendal Block A pudendal block is a form of local anesthetic. It may be used to relieve the pain associated with pushing or stretching of the perineum at the time of delivery or to  further numb the perineum. A pudendal block is done by injecting numbing medicine through the vaginal wall into a nerve in the pelvis. Epidural Analgesia Epidural analgesia is given through a flexible IV catheter that is inserted into the lower back. Numbing medicine is delivered continuously to the area near your spinal column nerves (epidural space).  After having this type of analgesia, you may be able to move your legs but you most likely will not be able to walk. Depending on the amount of medicine given, you may lose all feeling in the lower half of your body, or you may retain some level of sensation, including the urge to push. Epidural analgesia can be used to provide pain relief for a vaginal birth. Spinal Block A spinal block is similar to epidural analgesia, but the medicine is injected into the spinal fluid instead of the epidural space. A spinal block is only given once. It starts to relieve pain quickly, but the pain relief lasts only 1-6 hours. Spinal blocks can be used for cesarean deliveries. Combined Spinal-Epidural (CSE) Block A CSE block combines the effects of a spinal block and epidural analgesia. The spinal block works quickly to block all pain. The epidural analgesia provides continuous pain relief, even after the effects of the spinal block have worn off. This information is not intended to replace advice given to you by your health care provider. Make sure you discuss any questions you have with your health care provider. Document Released: 03/02/2009 Document Revised: 04/22/2016 Document Reviewed: 04/06/2016 Elsevier Interactive Patient Education  2019 [employer].

## 2019-01-29 NOTE — Addendum Note (Signed)
Addended by: Sharen Counter A on: 01/29/2019 01:25 PM   Modules accepted: Orders

## 2019-01-31 LAB — CERVICOVAGINAL ANCILLARY ONLY
Chlamydia: NEGATIVE
Neisseria Gonorrhea: NEGATIVE

## 2019-02-02 LAB — CULTURE, BETA STREP (GROUP B ONLY): STREP GP B CULTURE: NEGATIVE

## 2019-02-05 ENCOUNTER — Ambulatory Visit (INDEPENDENT_AMBULATORY_CARE_PROVIDER_SITE_OTHER): Payer: Medicaid Other

## 2019-02-05 VITALS — BP 124/79 | HR 99 | Wt 193.4 lb

## 2019-02-05 DIAGNOSIS — Z3480 Encounter for supervision of other normal pregnancy, unspecified trimester: Secondary | ICD-10-CM

## 2019-02-05 DIAGNOSIS — O2603 Excessive weight gain in pregnancy, third trimester: Secondary | ICD-10-CM

## 2019-02-05 DIAGNOSIS — Z3A38 38 weeks gestation of pregnancy: Secondary | ICD-10-CM

## 2019-02-05 NOTE — Progress Notes (Signed)
   PRENATAL VISIT NOTE  Subjective:  Adriana [name] is a 23 y.o. G2P0101 at [redacted]w[redacted]d who presents today for routine prenatal care.  She is currently being monitored for supervision of a low-risk pregnancy with problems as listed below.  Patient has no pregnancy related concerns and endorses fetal movement and cramping.  She denies vaginal concerns including discharge, bleeding, leaking, itching, and burning. She reports completion of terazol cream yesterday because she missed Saturday treatment.   Patient Active Problem List   Diagnosis Date Noted  . Heartburn during pregnancy in second trimester 09/20/2018  . Supervision of other normal pregnancy, antepartum 07/25/2018  . Migraine headache with aura 04/16/2014    The following portions of the patient's history were reviewed and updated as appropriate: allergies, current medications, past family history, past medical history, past social history, past surgical history and problem list. Problem list updated.  Objective:   Vitals:   02/05/19 0922  BP: 124/79  Pulse: 99  Weight: 193 lb 6.4 oz (87.7 kg)    Fetal Status: Fetal Heart Rate (bpm): 142 Fundal Height: 37 cm Movement: Present     General:  Alert, oriented and cooperative. Patient is in no acute distress.  Skin: Skin is warm and dry.   Cardiovascular: Regular rate and rhythm.  Respiratory: Normal respiratory effort. CTA-Bilaterally  Abdomen: Soft, gravid, appropriate for gestational age.  Pelvic: Cervical exam performed        Extremities: Normal range of motion.  Edema: Trace  Mental Status: Normal mood and affect. Normal behavior. Normal judgment and thought content.   Assessment and Plan:  Pregnancy: G2P0101 at [redacted]w[redacted]d  1. Supervision of other normal pregnancy, antepartum -Anticipatory guidance for upcoming appts. -Discussed exam findings and lack of cervical change. -Contractions palpated in office; mild to moderate -Educated on when to contact for labor; Given  5/1/1. -Discussed plans for postpartum period including delivery and contraception. -Unsure of contraception in PPP, considering Nexplanon.  Contraception choices info sheet given. -Plans to bottlefeed.  2. Constipation -Reports constipation despite not taking iron supplements. -Reports drinking more than 8 bottles of water daily. -Discussed modifications in diet. -Consider colace if continues next week   2. Excess weight gain in pregnancy, third trimester -Discussed weight gain of 4 lbs in one week. -Discussed proper nutrition in pregnancy and in postpartum period.  Term labor symptoms and general obstetric precautions including but not limited to vaginal bleeding, contractions, leaking of fluid and fetal movement were reviewed with the patient.  Please refer to After Visit Summary for other counseling recommendations.  Return in about 1 week (around 02/12/2019) for ROB.  No future appointments.  Cherre Robins, CNM 02/05/2019, 9:50 AM

## 2019-02-05 NOTE — Patient Instructions (Addendum)
Vaginal Delivery  Vaginal delivery means that you give birth by pushing your baby out of your birth canal (vagina). A team of health care providers will help you before, during, and after vaginal delivery. Birth experiences are unique for every woman and every pregnancy, and birth experiences vary depending on where you choose to give birth. What happens when I arrive at the birth center or hospital? Once you are in labor and have been admitted into the hospital or birth center, your health care provider may:  Review your pregnancy history and any concerns that you have.  Insert an IV into one of your veins. This may be used to give you fluids and medicines.  Check your blood pressure, pulse, temperature, and heart rate (vital signs).  Check whether your bag of water (amniotic sac) has broken (ruptured).  Talk with you about your birth plan and discuss pain control options. Monitoring Your health care provider may monitor your contractions (uterine monitoring) and your baby's heart rate (fetal monitoring). You may need to be monitored:  Often, but not continuously (intermittently).  All the time or for long periods at a time (continuously). Continuous monitoring may be needed if: ? You are taking certain medicines, such as medicine to relieve pain or make your contractions stronger. ? You have pregnancy or labor complications. Monitoring may be done by:  Placing a special stethoscope or a handheld monitoring device on your abdomen to check your baby's heartbeat and to check for contractions.  Placing monitors on your abdomen (external monitors) to record your baby's heartbeat and the frequency and length of contractions.  Placing monitors inside your uterus through your vagina (internal monitors) to record your baby's heartbeat and the frequency, length, and strength of your contractions. Depending on the type of monitor, it may remain in your uterus or on your baby's head until  birth.  Telemetry. This is a type of continuous monitoring that can be done with external or internal monitors. Instead of having to stay in bed, you are able to move around during telemetry. Physical exam Your health care provider may perform frequent physical exams. This may include:  Checking how and where your baby is positioned in your uterus.  Checking your cervix to determine: ? Whether it is thinning out (effacing). ? Whether it is opening up (dilating). What happens during labor and delivery?  Normal labor and delivery is divided into the following three stages: Stage 1  This is the longest stage of labor.  This stage can last for hours or days.  Throughout this stage, you will feel contractions. Contractions generally feel mild, infrequent, and irregular at first. They get stronger, more frequent (about every 2-3 minutes), and more regular as you move through this stage.  This stage ends when your cervix is completely dilated to 4 inches (10 cm) and completely effaced. Stage 2  This stage starts once your cervix is completely effaced and dilated and lasts until the delivery of your baby.  This stage may last from 20 minutes to 2 hours.  This is the stage where you will feel an urge to push your baby out of your vagina.  You may feel stretching and burning pain, especially when the widest part of your baby's head passes through the vaginal opening (crowning).  Once your baby is delivered, the umbilical cord will be clamped and cut. This usually occurs after waiting a period of 1-2 minutes after delivery.  Your baby will be placed on your bare chest (  skin-to-skin contact) in an upright position and covered with a warm blanket. Watch your baby for feeding cues, like rooting or sucking, and help the baby to your breast for his or her first feeding. Stage 3  This stage starts immediately after the birth of your baby and ends after you deliver the placenta.  This stage may  take anywhere from 5 to 30 minutes.  After your baby has been delivered, you will feel contractions as your body expels the placenta and your uterus contracts to control bleeding. What can I expect after labor and delivery?  After labor is over, you and your baby will be monitored closely until you are ready to go home to ensure that you are both healthy. Your health care team will teach you how to care for yourself and your baby.  You and your baby will stay in the same room (rooming in) during your hospital stay. This will encourage early bonding and successful breastfeeding.  You may continue to receive fluids and medicines through an IV.  Your uterus will be checked and massaged regularly (fundal massage).  You will have some soreness and pain in your abdomen, vagina, and the area of skin between your vaginal opening and your anus (perineum).  If an incision was made near your vagina (episiotomy) or if you had some vaginal tearing during delivery, cold compresses may be placed on your episiotomy or your tear. This helps to reduce pain and swelling.  You may be given a squirt bottle to use instead of wiping when you go to the bathroom. To use the squirt bottle, follow these steps: ? Before you urinate, fill the squirt bottle with warm water. Do not use hot water. ? After you urinate, while you are sitting on the toilet, use the squirt bottle to rinse the area around your urethra and vaginal opening. This rinses away any urine and blood. ? Fill the squirt bottle with clean water every time you use the bathroom.  It is normal to have vaginal bleeding after delivery. Wear a sanitary pad for vaginal bleeding and discharge. Summary  Vaginal delivery means that you will give birth by pushing your baby out of your birth canal (vagina).  Your health care provider may monitor your contractions (uterine monitoring) and your baby's heart rate (fetal monitoring).  Your health care provider may  perform a physical exam.  Normal labor and delivery is divided into three stages.  After labor is over, you and your baby will be monitored closely until you are ready to go home. This information is not intended to replace advice given to you by your health care provider. Make sure you discuss any questions you have with your health care provider. Document Released: 08/23/2008 Document Revised: 12/19/2017 Document Reviewed: 12/19/2017 Elsevier Interactive Patient Education  2019 [employer].  Contraception Choices Contraception, also called birth control, refers to methods or devices that prevent pregnancy. Hormonal methods Contraceptive implant  A contraceptive implant is a thin, plastic tube that contains a hormone. It is inserted into the upper part of the arm. It can remain in place for up to 3 years. Progestin-only injections Progestin-only injections are injections of progestin, a synthetic form of the hormone progesterone. They are given every 3 months by a health care provider. Birth control pills  Birth control pills are pills that contain hormones that prevent pregnancy. They must be taken once a day, preferably at the same time each day. Birth control patch  The birth control patch  contains hormones that prevent pregnancy. It is placed on the skin and must be changed once a week for three weeks and removed on the fourth week. A prescription is needed to use this method of contraception. Vaginal ring  A vaginal ring contains hormones that prevent pregnancy. It is placed in the vagina for three weeks and removed on the fourth week. After that, the process is repeated with a new ring. A prescription is needed to use this method of contraception. Emergency contraceptive Emergency contraceptives prevent pregnancy after unprotected sex. They come in pill form and can be taken up to 5 days after sex. They work best the sooner they are taken after having sex. Most emergency  contraceptives are available without a prescription. This method should not be used as your only form of birth control. Barrier methods Female condom  A female condom is a thin sheath that is worn over the penis during sex. Condoms keep sperm from going inside a woman's body. They can be used with a spermicide to increase their effectiveness. They should be disposed after a single use. Female condom  A female condom is a soft, loose-fitting sheath that is put into the vagina before sex. The condom keeps sperm from going inside a woman's body. They should be disposed after a single use. Diaphragm  A diaphragm is a soft, dome-shaped barrier. It is inserted into the vagina before sex, along with a spermicide. The diaphragm blocks sperm from entering the uterus, and the spermicide kills sperm. A diaphragm should be left in the vagina for 6-8 hours after sex and removed within 24 hours. A diaphragm is prescribed and fitted by a health care provider. A diaphragm should be replaced every 1-2 years, after giving birth, after gaining more than 15 lb (6.8 kg), and after pelvic surgery. Cervical cap  A cervical cap is a round, soft latex or plastic cup that fits over the cervix. It is inserted into the vagina before sex, along with spermicide. It blocks sperm from entering the uterus. The cap should be left in place for 6-8 hours after sex and removed within 48 hours. A cervical cap must be prescribed and fitted by a health care provider. It should be replaced every 2 years. Sponge  A sponge is a soft, circular piece of polyurethane foam with spermicide on it. The sponge helps block sperm from entering the uterus, and the spermicide kills sperm. To use it, you make it wet and then insert it into the vagina. It should be inserted before sex, left in for at least 6 hours after sex, and removed and thrown away within 30 hours. Spermicides Spermicides are chemicals that kill or block sperm from entering the cervix  and uterus. They can come as a cream, jelly, suppository, foam, or tablet. A spermicide should be inserted into the vagina with an applicator at least 10-15 minutes before sex to allow time for it to work. The process must be repeated every time you have sex. Spermicides do not require a prescription. Intrauterine contraception Intrauterine device (IUD) An IUD is a T-shaped device that is put in a woman's uterus. There are two types:  Hormone IUD.This type contains progestin, a synthetic form of the hormone progesterone. This type can stay in place for 3-5 years.  Copper IUD.This type is wrapped in copper wire. It can stay in place for 10 years.  Permanent methods of contraception Female tubal ligation In this method, a woman's fallopian tubes are sealed, tied, or blocked  during surgery to prevent eggs from traveling to the uterus. Hysteroscopic sterilization In this method, a small, flexible insert is placed into each fallopian tube. The inserts cause scar tissue to form in the fallopian tubes and block them, so sperm cannot reach an egg. The procedure takes about 3 months to be effective. Another form of birth control must be used during those 3 months. Female sterilization This is a procedure to tie off the tubes that carry sperm (vasectomy). After the procedure, the man can still ejaculate fluid (semen). Natural planning methods Natural family planning In this method, a couple does not have sex on days when the woman could become pregnant. Calendar method This means keeping track of the length of each menstrual cycle, identifying the days when pregnancy can happen, and not having sex on those days. Ovulation method In this method, a couple avoids sex during ovulation. Symptothermal method This method involves not having sex during ovulation. The woman typically checks for ovulation by watching changes in her temperature and in the consistency of cervical mucus. Post-ovulation method In  this method, a couple waits to have sex until after ovulation. Summary  Contraception, also called birth control, means methods or devices that prevent pregnancy.  Hormonal methods of contraception include implants, injections, pills, patches, vaginal rings, and emergency contraceptives.  Barrier methods of contraception can include female condoms, female condoms, diaphragms, cervical caps, sponges, and spermicides.  There are two types of IUDs (intrauterine devices). An IUD can be put in a woman's uterus to prevent pregnancy for 3-5 years.  Permanent sterilization can be done through a procedure for males, females, or both.  Natural family planning methods involve not having sex on days when the woman could become pregnant. This information is not intended to replace advice given to you by your health care provider. Make sure you discuss any questions you have with your health care provider. Document Released: 11/14/2005 Document Revised: 11/16/2017 Document Reviewed: 12/17/2016 Elsevier Interactive Patient Education  2019 [employer].

## 2019-02-09 ENCOUNTER — Other Ambulatory Visit: Payer: Self-pay

## 2019-02-09 ENCOUNTER — Inpatient Hospital Stay (HOSPITAL_COMMUNITY)
Admission: AD | Admit: 2019-02-09 | Discharge: 2019-02-09 | Disposition: A | Discharge status: Home / Self Care | Payer: Medicaid Other | Attending: Obstetrics and Gynecology | Admitting: Obstetrics and Gynecology

## 2019-02-09 ENCOUNTER — Encounter (HOSPITAL_COMMUNITY): Payer: Self-pay

## 2019-02-09 DIAGNOSIS — Z3A38 38 weeks gestation of pregnancy: Secondary | ICD-10-CM | POA: Diagnosis not present

## 2019-02-09 DIAGNOSIS — Z0371 Encounter for suspected problem with amniotic cavity and membrane ruled out: Secondary | ICD-10-CM | POA: Diagnosis not present

## 2019-02-09 DIAGNOSIS — O26893 Other specified pregnancy related conditions, third trimester: Secondary | ICD-10-CM | POA: Insufficient documentation

## 2019-02-09 DIAGNOSIS — O471 False labor at or after 37 completed weeks of gestation: Secondary | ICD-10-CM

## 2019-02-09 DIAGNOSIS — O479 False labor, unspecified: Secondary | ICD-10-CM

## 2019-02-09 NOTE — Discharge Instructions (Signed)

## 2019-02-09 NOTE — MAU Note (Signed)
Was contracting around 0500, not now.  ~3in spot of wetness in shorts when got up at 0720, none since. No bleeding. Denies problems with preg.  Was 1 cm when last checked.

## 2019-02-09 NOTE — MAU Provider Note (Signed)
None      S: Ms. Adriana [name] is a 23 y.o. G2P0101 at [redacted]w[redacted]d  who presents to MAU today complaining of leaking x 1 episode at 0720. This followed an episode of contractions every 5-10 minutes x 2 hours that resolved.  There have been no contractions and no leakage since 0730.  She denies vaginal bleeding. She denies contractions. She reports normal fetal movement.    O: BP 122/68 (BP Location: Right Arm)   Pulse 88   Resp 18   Ht 5\' 6"  (1.676 m)   Wt 86.6 kg   LMP 05/13/2018 (Exact Date)   SpO2 100%   BMI 30.81 kg/m  GENERAL: Well-developed, well-nourished female in no acute distress.  HEAD: Normocephalic, atraumatic.  CHEST: Normal effort of breathing, regular heart rate ABDOMEN: Soft, nontender, gravid PELVIC: Normal external female genitalia. Vagina is pink and rugated. Cervix with normal contour, no lesions. Normal discharge.  No pooling.   Cervical exam:  Dilation: 1 Effacement (%): 50 Station: -2 Presentation: Vertex Exam by:: Sharen Counter CNM    Fetal Monitoring: Baseline: 145 Variability: moderate Accelerations: present Decelerations: none Contractions: rare, mild to palpation   Ferning negative  A: SIUP at [redacted]w[redacted]d  Membranes intact  P: D/C home with labor precautions. Keep prenatal appt on 02/12/19.  Hurshel Party, CNM 02/09/2019 11:40 AM

## 2019-02-11 ENCOUNTER — Inpatient Hospital Stay (HOSPITAL_COMMUNITY)
Admission: AD | Admit: 2019-02-11 | Discharge: 2019-02-11 | DRG: 807 | Disposition: A | Discharge status: Home / Self Care | Payer: Medicaid Other | Attending: Obstetrics & Gynecology | Admitting: Obstetrics & Gynecology

## 2019-02-11 ENCOUNTER — Inpatient Hospital Stay (HOSPITAL_COMMUNITY): Payer: Medicaid Other | Admitting: Anesthesiology

## 2019-02-11 ENCOUNTER — Other Ambulatory Visit: Payer: Self-pay

## 2019-02-11 ENCOUNTER — Inpatient Hospital Stay (HOSPITAL_BASED_OUTPATIENT_CLINIC_OR_DEPARTMENT_OTHER): Payer: Medicaid Other

## 2019-02-11 ENCOUNTER — Other Ambulatory Visit: Payer: Self-pay | Admitting: Certified Nurse Midwife

## 2019-02-11 ENCOUNTER — Telehealth: Payer: Self-pay | Admitting: *Deleted

## 2019-02-11 ENCOUNTER — Encounter (HOSPITAL_COMMUNITY): Payer: Self-pay | Admitting: *Deleted

## 2019-02-11 DIAGNOSIS — O26892 Other specified pregnancy related conditions, second trimester: Secondary | ICD-10-CM

## 2019-02-11 DIAGNOSIS — O364XX0 Maternal care for intrauterine death, not applicable or unspecified: Secondary | ICD-10-CM | POA: Diagnosis present

## 2019-02-11 DIAGNOSIS — Z3A39 39 weeks gestation of pregnancy: Secondary | ICD-10-CM

## 2019-02-11 DIAGNOSIS — R12 Heartburn: Secondary | ICD-10-CM

## 2019-02-11 DIAGNOSIS — O368130 Decreased fetal movements, third trimester, not applicable or unspecified: Secondary | ICD-10-CM | POA: Diagnosis present

## 2019-02-11 DIAGNOSIS — Z87891 Personal history of nicotine dependence: Secondary | ICD-10-CM

## 2019-02-11 DIAGNOSIS — O09299 Supervision of pregnancy with other poor reproductive or obstetric history, unspecified trimester: Secondary | ICD-10-CM | POA: Diagnosis present

## 2019-02-11 DIAGNOSIS — Z3480 Encounter for supervision of other normal pregnancy, unspecified trimester: Secondary | ICD-10-CM

## 2019-02-11 DIAGNOSIS — O021 Missed abortion: Secondary | ICD-10-CM

## 2019-02-11 LAB — COMPREHENSIVE METABOLIC PANEL
ALT: 13 U/L (ref 0–44)
AST: 23 U/L (ref 15–41)
Albumin: 2.9 g/dL — ABNORMAL LOW (ref 3.5–5.0)
Alkaline Phosphatase: 118 U/L (ref 38–126)
Anion gap: 9 (ref 5–15)
CALCIUM: 9.3 mg/dL (ref 8.9–10.3)
CO2: 24 mmol/L (ref 22–32)
Chloride: 104 mmol/L (ref 98–111)
Creatinine, Ser: 0.6 mg/dL (ref 0.44–1.00)
GFR calc Af Amer: >60 mL/min (ref >60)
GFR calc non Af Amer: >60 mL/min (ref >60)
Glucose, Bld: 92 mg/dL (ref 70–99)
Potassium: 4.1 mmol/L (ref 3.5–5.1)
Sodium: 137 mmol/L (ref 135–145)
Total Bilirubin: 0.5 mg/dL (ref 0.3–1.2)
Total Protein: 6.9 g/dL (ref 6.5–8.1)

## 2019-02-11 LAB — CBC
HCT: 34.4 % — ABNORMAL LOW (ref 36.0–46.0)
Hemoglobin: 11.3 g/dL — ABNORMAL LOW (ref 12.0–15.0)
MCH: 31.3 pg (ref 26.0–34.0)
MCHC: 32.8 g/dL (ref 30.0–36.0)
MCV: 95.3 fL (ref 80.0–100.0)
NRBC: 0 % (ref 0.0–0.2)
Platelets: 295 10*3/uL (ref 150–400)
RBC: 3.61 MIL/uL — ABNORMAL LOW (ref 3.87–5.11)
RDW: 13.4 % (ref 11.5–15.5)
WBC: 7.2 10*3/uL (ref 4.0–10.5)

## 2019-02-11 LAB — PROTEIN / CREATININE RATIO, URINE
Creatinine, Urine: 18.3 mg/dL
Total Protein, Urine: <3 mg/dL

## 2019-02-11 LAB — DIC (DISSEMINATED INTRAVASCULAR COAGULATION)PANEL
D-Dimer, Quant: 1.71 ug/mL-FEU — ABNORMAL HIGH (ref 0.00–0.50)
Fibrinogen: 600 mg/dL — ABNORMAL HIGH (ref 210–475)
INR: 0.9 (ref 0.8–1.2)
Platelets: 293 10*3/uL (ref 150–400)
Prothrombin Time: 12 seconds (ref 11.4–15.2)
Smear Review: NONE SEEN
aPTT: 31 seconds (ref 24–36)

## 2019-02-11 LAB — TYPE AND SCREEN
ABO/RH(D): A POS
Antibody Screen: NEGATIVE

## 2019-02-11 MED ORDER — PHENYLEPHRINE 40 MCG/ML (10ML) SYRINGE FOR IV PUSH (FOR BLOOD PRESSURE SUPPORT)
80.0000 ug | PREFILLED_SYRINGE | INTRAVENOUS | Status: DC | PRN
  Filled 2019-02-11 (×2): qty 10

## 2019-02-11 MED ORDER — DIPHENHYDRAMINE HCL 50 MG/ML IJ SOLN: 12.5000 mg | INTRAMUSCULAR | Status: DC | PRN

## 2019-02-11 MED ORDER — LACTATED RINGERS IV SOLN: 500.0000 mL | INTRAVENOUS | Status: DC | PRN

## 2019-02-11 MED ORDER — MISOPROSTOL 200 MCG PO TABS
ORAL_TABLET | ORAL | Status: AC
  Filled 2019-02-11: qty 1

## 2019-02-11 MED ORDER — DIBUCAINE 1 % RE OINT: 1.0000 "application " | TOPICAL_OINTMENT | RECTAL | Status: DC | PRN

## 2019-02-11 MED ORDER — ONDANSETRON HCL 4 MG/2ML IJ SOLN: 4.0000 mg | INTRAMUSCULAR | Status: DC | PRN

## 2019-02-11 MED ORDER — ONDANSETRON HCL 4 MG PO TABS: 4.0000 mg | ORAL_TABLET | ORAL | Status: DC | PRN

## 2019-02-11 MED ORDER — SODIUM CHLORIDE (PF) 0.9 % IJ SOLN
INTRAMUSCULAR | Status: DC | PRN
  Administered 2019-02-11: 12 mL/h via EPIDURAL

## 2019-02-11 MED ORDER — SODIUM CHLORIDE 0.9 % IV SOLN: 5.0000 10*6.[IU] | Freq: Once | INTRAVENOUS | Status: DC

## 2019-02-11 MED ORDER — FENTANYL-BUPIVACAINE-NACL 0.5-0.125-0.9 MG/250ML-% EP SOLN: 12.0000 mL/h | EPIDURAL | Status: DC | PRN

## 2019-02-11 MED ORDER — EPHEDRINE 5 MG/ML INJ
10.0000 mg | INTRAVENOUS | Status: DC | PRN
  Filled 2019-02-11: qty 2

## 2019-02-11 MED ORDER — WITCH HAZEL-GLYCERIN EX PADS: 1.0000 "application " | MEDICATED_PAD | CUTANEOUS | Status: DC | PRN

## 2019-02-11 MED ORDER — ACETAMINOPHEN 325 MG PO TABS: 650.0000 mg | ORAL_TABLET | ORAL | Status: DC | PRN

## 2019-02-11 MED ORDER — DIPHENHYDRAMINE HCL 25 MG PO CAPS: 25.0000 mg | ORAL_CAPSULE | Freq: Four times a day (QID) | ORAL | Status: DC | PRN

## 2019-02-11 MED ORDER — PHENYLEPHRINE 40 MCG/ML (10ML) SYRINGE FOR IV PUSH (FOR BLOOD PRESSURE SUPPORT)
80.0000 ug | PREFILLED_SYRINGE | INTRAVENOUS | Status: DC | PRN
  Filled 2019-02-11: qty 10

## 2019-02-11 MED ORDER — ZOLPIDEM TARTRATE 5 MG PO TABS: 5.0000 mg | ORAL_TABLET | Freq: Every evening | ORAL | Status: DC | PRN

## 2019-02-11 MED ORDER — FENTANYL CITRATE (PF) 100 MCG/2ML IJ SOLN
100.0000 ug | INTRAMUSCULAR | Status: DC | PRN
  Administered 2019-02-11: 100 ug via INTRAVENOUS
  Filled 2019-02-11: qty 2

## 2019-02-11 MED ORDER — LIDOCAINE HCL (PF) 1 % IJ SOLN
INTRAMUSCULAR | Status: DC | PRN
  Administered 2019-02-11: 6 mL via EPIDURAL

## 2019-02-11 MED ORDER — LACTATED RINGERS IV SOLN
INTRAVENOUS | Status: DC
  Administered 2019-02-11: 11:00:00 via INTRAVENOUS

## 2019-02-11 MED ORDER — TETANUS-DIPHTH-ACELL PERTUSSIS 5-2.5-18.5 LF-MCG/0.5 IM SUSP: 0.5000 mL | Freq: Once | INTRAMUSCULAR | Status: DC

## 2019-02-11 MED ORDER — LACTATED RINGERS IV SOLN
500.0000 mL | Freq: Once | INTRAVENOUS | Status: AC
  Administered 2019-02-11: 500 mL via INTRAVENOUS

## 2019-02-11 MED ORDER — CLINDAMYCIN PHOSPHATE 900 MG/50ML IV SOLN: 900.0000 mg | Freq: Three times a day (TID) | INTRAVENOUS | Status: DC

## 2019-02-11 MED ORDER — OXYTOCIN 40 UNITS IN NORMAL SALINE INFUSION - SIMPLE MED
2.5000 [IU]/h | INTRAVENOUS | Status: DC
  Filled 2019-02-11: qty 1000

## 2019-02-11 MED ORDER — MISOPROSTOL 200 MCG PO TABS
200.0000 ug | ORAL_TABLET | ORAL | Status: DC | PRN
  Administered 2019-02-11: 200 ug via VAGINAL

## 2019-02-11 MED ORDER — OXYTOCIN BOLUS FROM INFUSION
500.0000 mL | Freq: Once | INTRAVENOUS | Status: AC
  Administered 2019-02-11: 500 mL via INTRAVENOUS

## 2019-02-11 MED ORDER — ONDANSETRON HCL 4 MG/2ML IJ SOLN
4.0000 mg | Freq: Four times a day (QID) | INTRAMUSCULAR | Status: DC | PRN
  Administered 2019-02-11: 4 mg via INTRAVENOUS
  Filled 2019-02-11: qty 2

## 2019-02-11 MED ORDER — BENZOCAINE-MENTHOL 20-0.5 % EX AERO
1.0000 "application " | INHALATION_SPRAY | CUTANEOUS | Status: DC | PRN
  Administered 2019-02-11: 1 via TOPICAL
  Filled 2019-02-11: qty 56

## 2019-02-11 MED ORDER — LIDOCAINE HCL (PF) 1 % IJ SOLN: 30.0000 mL | INTRAMUSCULAR | Status: DC | PRN

## 2019-02-11 MED ORDER — IBUPROFEN 600 MG PO TABS
600.0000 mg | ORAL_TABLET | Freq: Four times a day (QID) | ORAL | Status: DC
  Administered 2019-02-11: 600 mg via ORAL
  Filled 2019-02-11: qty 1

## 2019-02-11 MED ORDER — FENTANYL-BUPIVACAINE-NACL 0.5-0.125-0.9 MG/250ML-% EP SOLN
12.0000 mL/h | EPIDURAL | Status: DC | PRN
  Filled 2019-02-11: qty 250

## 2019-02-11 MED ORDER — PENICILLIN G 3 MILLION UNITS IVPB - SIMPLE MED: 3.0000 10*6.[IU] | INTRAVENOUS | Status: DC

## 2019-02-11 NOTE — MAU Note (Signed)
Presents with c/o decreased FM since yesterday.  Also reports leaking mucuosy discharge, states "I don't think my water is broke."  Reports seen Saturday for LOF, wasn't ruptured.

## 2019-02-11 NOTE — Anesthesia Postprocedure Evaluation (Signed)
Anesthesia Post Note  Patient: Leniah Willette  Procedure(s) Performed: AN AD HOC LABOR EPIDURAL     Patient location during evaluation: Mother Baby Anesthesia Type: Epidural Level of consciousness: awake and alert Pain management: pain level controlled Vital Signs Assessment: post-procedure vital signs reviewed and stable Respiratory status: spontaneous breathing, nonlabored ventilation and respiratory function stable Cardiovascular status: stable Postop Assessment: no headache, no backache and epidural receding Anesthetic complications: no    Last Vitals:  Vitals:   02/11/19 1601 02/11/19 1645  BP: 128/74 132/62  Pulse: 87 79  Resp: 20 18  Temp:  37 C  SpO2:  100%    Last Pain:  Vitals:   02/11/19 1645  TempSrc: Oral  PainSc: 3    Pain Goal: Patients Stated Pain Goal: 3 (02/11/19 1645)              Epidural/Spinal Function Cutaneous sensation: Normal sensation (02/11/19 1645), Patient able to flex knees: Yes (02/11/19 1645), Patient able to lift hips off bed: Yes (02/11/19 1645), Back pain beyond tenderness at insertion site: No (02/11/19 1645), Progressively worsening motor and/or sensory loss: No (02/11/19 1645), Bowel and/or bladder incontinence post epidural: No (02/11/19 1645)  Janett Kamath

## 2019-02-11 NOTE — Progress Notes (Signed)
Aggie Cosier, ultrasound tech, into perform formal bedside U/S.

## 2019-02-11 NOTE — Anesthesia Preprocedure Evaluation (Signed)
Anesthesia Evaluation  Patient identified by MRN, date of birth, ID band Patient awake    Reviewed: Allergy & Precautions, H&P , NPO status , Patient's Chart, lab work & pertinent test results, reviewed documented beta blocker date and time   Airway Mallampati: II  TM Distance: >3 FB Neck ROM: full    Dental no notable dental hx.    Pulmonary neg pulmonary ROS, former smoker,    Pulmonary exam normal breath sounds clear to auscultation       Cardiovascular negative cardio ROS Normal cardiovascular exam Rhythm:regular Rate:Normal     Neuro/Psych negative neurological ROS  negative psych ROS   GI/Hepatic negative GI ROS, Neg liver ROS,   Endo/Other  negative endocrine ROS  Renal/GU negative Renal ROS  negative genitourinary   Musculoskeletal   Abdominal   Peds  Hematology negative hematology ROS (+)   Anesthesia Other Findings   Reproductive/Obstetrics (+) Pregnancy                             Anesthesia Physical Anesthesia Plan  ASA: II  Anesthesia Plan: Epidural   Post-op Pain Management:    Induction:   PONV Risk Score and Plan:   Airway Management Planned:   Additional Equipment:   Intra-op Plan:   Post-operative Plan:   Informed Consent: I have reviewed the patients History and Physical, chart, labs and discussed the procedure including the risks, benefits and alternatives for the proposed anesthesia with the patient or authorized representative who has indicated his/her understanding and acceptance.     Dental Advisory Given  Plan Discussed with:   Anesthesia Plan Comments: (Labs checked- platelets confirmed with RN in room. IUFD Discussed epidural, and patient consents to the procedure:  included risk of possible headache,backache, failed block, allergic reaction, and nerve injury. This patient was asked if she had any questions or concerns before the procedure  started.)        Anesthesia Quick Evaluation

## 2019-02-11 NOTE — Discharge Summary (Addendum)
Postpartum Discharge Summary     Patient Name: Adriana [name] DOB: July 14, 1996 MRN: [id number]  Date of admission: 02/11/2019 Delivering Provider: Sharyon Cable   Date of discharge: 02/11/2019  Admitting diagnosis: poss water leaking ctx  Intrauterine pregnancy: 102w1d     Secondary diagnosis:  Active Problems:   IUFD at 20 weeks or more of gestation  Additional problems: none     Discharge diagnosis: Intrauterine Fetal Demise                                                                                                Post partum procedures:none  Augmentation: Cytotec  Complications: [facility] course:  Induction of Labor With Vaginal Delivery   23 y.o. yo G2P0101 at [redacted]w[redacted]d was admitted to the hospital 02/11/2019 for induction of labor.  Indication for induction: IUFD.  Patient had an uncomplicated labor course as follows: Membrane Rupture Time/Date: 10:00 AM ,02/09/2019   Intrapartum Procedures: Episiotomy: None [1]                                         Lacerations:  None [1]  Patient had delivery of a Non Viable infant.  Information for the patient's newborn:  Adriana, [name] [[id number]]      02/11/2019  Details of delivery can be found in separate delivery note.  Patient had a routine postpartum course. Patient is discharged home 02/11/19.  Magnesium Sulfate recieved: No BMZ received: No  Physical exam  Vitals:   02/11/19 1501 02/11/19 1531 02/11/19 1601 02/11/19 1645  BP: 116/78 126/80 128/74 132/62  Pulse: 85 85 87 79  Resp: 20 18 20 18   Temp:    98.6 F (37 C)  TempSrc:    Oral  SpO2:    100%  Weight:      Height:       General: alert, cooperative and no distress Lochia: appropriate Uterine Fundus: firm Incision: N/A DVT Evaluation: No evidence of DVT seen on physical exam. No cords or calf tenderness. No significant calf/ankle edema. Labs: Lab Results  Component Value Date   WBC 7.2 02/11/2019   HGB 11.3 (L) 02/11/2019   HCT  34.4 (L) 02/11/2019   MCV 95.3 02/11/2019   PLT 293 02/11/2019   PLT 295 02/11/2019   CMP Latest Ref Rng & Units 02/11/2019  Glucose 70 - 99 mg/dL 92  BUN 6 - 20 mg/dL <0(N)  Creatinine 4.07 - 1.00 mg/dL 6.80  Sodium 881 - 103 mmol/L 137  Potassium 3.5 - 5.1 mmol/L 4.1  Chloride 98 - 111 mmol/L 104  CO2 22 - 32 mmol/L 24  Calcium 8.9 - 10.3 mg/dL 9.3  Total Protein 6.5 - 8.1 g/dL 6.9  Total Bilirubin 0.3 - 1.2 mg/dL 0.5  Alkaline Phos 38 - 126 U/L 118  AST 15 - 41 U/L 23  ALT 0 - 44 U/L 13    Discharge instruction: per After Visit Summary and "Baby and Me Booklet".  After visit meds:  Allergies as  of 02/11/2019      Reactions   Imitrex [sumatriptan] Other (See Comments)   Chest pain, heaviness, migraine   Latex Rash      Medication List    STOP taking these medications   Prenatal Gummies/DHA & FA 0.4-32.5 MG Chew     TAKE these medications   ferrous sulfate 325 (65 FE) MG tablet Commonly known as:  FerrouSul Take 1 tablet (325 mg total) by mouth daily with breakfast.   omeprazole 20 MG capsule Commonly known as:  PriLOSEC Take 1 capsule (20 mg total) by mouth 2 (two) times daily before a meal.       Diet: routine diet  Activity: Advance as tolerated. Pelvic rest for 6 weeks.   Outpatient follow up:2 weeks for mood check and 4 weeks for postpartum appointment  Follow up Appt: Future Appointments  Date Time Provider Department Center  02/18/2019  1:00 PM [facility] [place] [facility]  02/25/2019 11:15 AM Leftwich-Kirby, Wilmer Floor, CNM [facility] None  03/11/2019  8:35 AM Leftwich-Kirby, Wilmer Floor, CNM [facility] None   Follow up Visit: Follow-up Information    [facility]. Schedule an appointment as soon as possible for a visit in 2 week(s).   Specialty:  Obstetrics and Gynecology Why:  Make appointment to be seen in office in 2 weeks and 4 weeks  Contact information: [address], [address] [place]  [address] [phone]         Please schedule this patient for Postpartum visit in: 2 weeks for mood check and 4 weeks for postpartum care with the following provider: Any provider  For C/S patients schedule nurse incision check in weeks 2 weeks: no   Patient is an IUFD  Delivery mode: SVD  Anticipated Birth Control: other/unsure  PP Procedures needed: mood check in 2 weeks for IUFD and PP pap smear at 4 week appointment   Schedule Integrated BH visit: yes   Newborn Data: Stillborn female  Birth Weight:   APGAR: 0, 0  Newborn Delivery   Birth date/time:  02/11/2019 13:58:00 Delivery type:  Vaginal, Spontaneous    Disposition:morgue   02/11/2019 Sharyon Cable, CNM

## 2019-02-11 NOTE — Progress Notes (Signed)
CSW received consult due to IUFD.  CSW available for support secondary to Spiritual Care Services and will await call from Chaplain before becoming involved.  CSW screening out referral at this time.  Ayvah Caroll Irwin, LCSWA  [facility] [phone]  

## 2019-02-11 NOTE — Progress Notes (Signed)
   02/11/19 1645  Clinical Encounter Type  Visited With Patient and family together  Visit Type Initial  Referral From Nurse  Consult/Referral To Chaplain  The chaplain responded to page for Pt. spiritual care.  The chaplain reviewed the chart and checked in with  RN-Yancey.  The chaplain followed the RN direction and followed up later in the day.  At this time, the RN-Meghan guided the chaplain in checking in with the Pt.  The chaplain introduced herself to the Pt. and family. A pastoral presence was not needed at this time.

## 2019-02-11 NOTE — Telephone Encounter (Signed)
Patient called stating she needs to be seen because she has not felt her baby move since yesterday. I made patient aware I talked to Verdie Mosher and Verdie Mosher stated for her to go to [facility] hospital. Patient verbally agreed.

## 2019-02-11 NOTE — H&P (Addendum)
LABOR AND DELIVERY ADMISSION HISTORY AND PHYSICAL NOTE  Adriana [name] is a 23 y.o. female G2P0101 with IUP at [redacted]w[redacted]d by LMP presenting for decreased FM since before 1000 when she woke up on 02/10/2019 and abdominal cramping since last night. She reports leakage of mucuosy discharge since she was at her daughter's birthday party Saturday. She was also seen in [facility]U on Saturday 02/09/2019. It was noted at that visit that she had a category I FHR tracing and was no ROM.  Prenatal History/Complications: PNC at [facility] Pregnancy complications:  - none  Past Medical History: Past Medical History:  Diagnosis Date  . Migraines     Past Surgical History: Past Surgical History:  Procedure Laterality Date  . HERNIA REPAIR     as an infant    Obstetrical History: OB History    Gravida  2   Para  1   Term      Preterm  1   AB      Living  1     SAB      TAB      Ectopic      Multiple  0   Live Births  1           Social History: Social History   Socioeconomic History  . Marital status: Single    Spouse name: Not on file  . Number of children: 1  . Years of education: Not on file  . Highest education level: Not on file  Occupational History    Comment: non employeed  Social Needs  . Financial resource strain: Not hard at all  . Food insecurity:    Worry: Never true    Inability: Never true  . Transportation needs:    Medical: No    Non-medical: No  Tobacco Use  . Smoking status: Former Smoker    Last attempt to quit: 06/30/2016    Years since quitting: 2.6  . Smokeless tobacco: Never Used  Substance and Sexual Activity  . Alcohol use: No    Alcohol/week: 0.0 standard drinks  . Drug use: No  . Sexual activity: Yes    Birth control/protection: None  Lifestyle  . Physical activity:    Days per week: Not on file    Minutes per session: Not on file  . Stress: Not on file  Relationships  . Social connections:    Talks on phone: Not on file    Gets  together: Not on file    Attends religious service: Not on file    Active member of club or organization: Not on file    Attends meetings of clubs or organizations: Not on file    Relationship status: Not on file  Other Topics Concern  . Not on file  Social History Narrative  . Not on file    Family History: History reviewed. No pertinent family history.  Allergies: Allergies  Allergen Reactions  . Imitrex [Sumatriptan] Other (See Comments)    Chest pain, heaviness, migraine  . Latex Rash    Medications Prior to Admission  Medication Sig Dispense Refill Last Dose  . ferrous sulfate (FERROUSUL) 325 (65 FE) MG tablet Take 1 tablet (325 mg total) by mouth daily with breakfast. 30 tablet 5 Taking  . omeprazole (PRILOSEC) 20 MG capsule Take 1 capsule (20 mg total) by mouth 2 (two) times daily before a meal. 60 capsule 5 Taking  . Prenatal MV-Min-FA-Omega-3 (PRENATAL GUMMIES/DHA & FA) 0.4-32.5 MG CHEW Chew 1 each by mouth  at bedtime. 30 tablet 3 Taking     Review of Systems  All systems reviewed and negative except as stated in HPI  Physical Exam Blood pressure (!) 139/101, pulse (!) 131, temperature 98.3 F (36.8 C), temperature source Oral, resp. rate 18, last menstrual period 05/13/2018, SpO2 99 %. General appearance: alert, cooperative and moderate distress Lungs: clear to auscultation bilaterally Heart: regular rate and rhythm Abdomen: soft, non-tender; bowel sounds normal Extremities: No calf swelling or tenderness Presentation: cephalic  Fetal monitoring: none Uterine activity: none noted    Prenatal labs: ABO, Rh: A/Positive/-- (08/28 1628) Antibody: Negative (08/28 1628) Rubella: 5.38 (08/28 1628) RPR: Non Reactive (01/02 1010)  HBsAg: Negative (08/28 1628)  HIV: Non Reactive (01/02 1010)  GC/Chlamydia: Neg/Neg GBS: Negative (03/03 0000)  2 hr Glucola: Normal Genetic screening:  Low risk Anatomy US: Normal  Prenatal Transfer Tool  Maternal Diabetes:  No Genetic Screening: Normal Maternal Ultrasounds/Referrals: Normal Fetal Ultrasounds or other Referrals:  None Maternal Substance Abuse:  No Significant Maternal Medications:  None Significant Maternal Lab Results: Lab values include: Group B Strep negative  Results for orders placed or performed during the hospital encounter of 02/11/19 (from the past 24 hour(s))  DIC panel   Collection Time: 02/11/19  9:58 AM  Result Value Ref Range   Prothrombin Time PENDING 11.4 - 15.2 seconds   INR PENDING 0.8 - 1.2   aPTT PENDING 24 - 36 seconds   Fibrinogen PENDING 210 - 475 mg/dL   D-Dimer, Quant PENDING 0.00 - 0.50 ug/mL-FEU   Platelets 293 150 - 400 K/uL   Smear Review PENDING   CBC   Collection Time: 02/11/19  9:58 AM  Result Value Ref Range   WBC 7.2 4.0 - 10.5 K/uL   RBC 3.61 (L) 3.87 - 5.11 MIL/uL   Hemoglobin 11.3 (L) 12.0 - 15.0 g/dL   HCT 40.9 (L) 81.1 - 91.4 %   MCV 95.3 80.0 - 100.0 fL   MCH 31.3 26.0 - 34.0 pg   MCHC 32.8 30.0 - 36.0 g/dL   RDW 78.2 95.6 - 21.3 %   Platelets 295 150 - 400 K/uL   nRBC 0.0 0.0 - 0.2 %   Patient informed that the ultrasound is considered a limited OB ultrasound and is not intended to be a complete ultrasound exam.  Patient also informed that the ultrasound is not being completed with the intent of assessing for fetal or placental anomalies or any pelvic abnormalities.  Explained that the purpose of today's ultrasound is to assess for viability -- no cardiac activity noted. Formal bedside U/S ordered.  Patient acknowledges the purpose of the exam and the limitations of the study.  [place] Mfm Ob Limited  Result Date: 02/11/2019 ----------------------------------------------------------------------  OBSTETRICS REPORT                       (Signed Final 02/11/2019 10:02 am) ---------------------------------------------------------------------- Patient Info  ID #:       [id number]                          D.O.B.:  August 07, 1996 (22 yrs)  Name:       Adriana [name]                  Visit Date: 02/11/2019 09:44 am ---------------------------------------------------------------------- Performed By  Performed By:     Magnus Ivan           Ref. Address:  [address]                    [place], [place]                                                             [address]  Attending:        Noralee Space MD        [facility]  Referred By:      Conan Bowens                    MD ---------------------------------------------------------------------- Orders   #  Description                          Code         Ordered By   1  [place] MFM OB LIMITED                    16109.60     KELLY [name]  ----------------------------------------------------------------------   #  Order #                    Accession #                 Episode #   1  [id number]                  [id number]                  [id number]  ---------------------------------------------------------------------- Indications   [redacted] weeks gestation of pregnancy                Z3A.39   Unable to hear fetal heart tones as reason     O76   for ultrasound  ---------------------------------------------------------------------- Vital Signs                                                 Height:        5'5" ---------------------------------------------------------------------- Fetal Evaluation  Num Of Fetuses:         1  Cardiac Activity:       Not visualized  Presentation:           Cephalic  Amniotic Fluid  AFI FV:      Anhydramnios                              Largest Pocket(cm) 0.5  OB History  Gravidity:    2         Term:   0        Prem:   1  SAB:   0  TOP:          0       Ectopic:  0        Living: 1  Gestational Age  LMP:           39w 1d        Date:  05/13/18                 EDD:   02/17/19  Best:          39w 1d     Det. By:  LMP  (05/13/18)          EDD:   02/17/19.  Impression  Patient was evaluated for c/o decreased fetal  movements. A  limited ultrasound study was performed. Anhydramnios is  seen. Unfortunately, no fetal heart activity is seen. Cephalic  presentation.  Impression; Intrauterine fetal demise. Electronically Signed by: Noralee Space, MD Final Report  02/11/2019 10:02 am   Patient Active Problem List   Diagnosis Date Noted  . IUFD at 20 weeks or more of gestation 02/11/2019  . Heartburn during pregnancy in second trimester 09/20/2018  . Supervision of other normal pregnancy, antepartum 07/25/2018  . Migraine headache with aura 04/16/2014    Assessment: Adriana [name] is a 23 y.o. G2P0101 at [redacted]w[redacted]d here for decreased fetal movement -- IUFD dx'd by U/S today.  #Labor: none #Pain: none #FWB: IUFD #ID:  n/a #MOF: n/a #MOC: undecided #Circ:  n/a  Adriana [name], CNM 02/11/2019, 9:37 AM

## 2019-02-11 NOTE — Anesthesia Procedure Notes (Signed)
Epidural Patient location during procedure: OB Start time: 02/11/2019 1:15 PM End time: 02/11/2019 1:20 PM  Staffing Anesthesiologist: Bethena Midget, MD  Preanesthetic Checklist Completed: patient identified, site marked, surgical consent, pre-op evaluation, timeout performed, IV checked, risks and benefits discussed and monitors and equipment checked  Epidural Patient position: sitting Prep: site prepped and draped and DuraPrep Patient monitoring: continuous pulse ox and blood pressure Approach: midline Location: L3-L4 Injection technique: LOR air  Needle:  Needle type: Tuohy  Needle gauge: 17 G Needle length: 9 cm and 9 Needle insertion depth: 4 cm Catheter type: closed end flexible Catheter size: 19 Gauge Catheter at skin depth: 9 cm Test dose: negative  Assessment Events: blood not aspirated, injection not painful, no injection resistance, negative IV test and no paresthesia

## 2019-02-11 NOTE — Progress Notes (Signed)
LABOR PROGRESS NOTE  Adriana [name] is a 23 y.o. G2P0101 at [redacted]w[redacted]d  admitted for IOL for IUFD   Subjective: Patient doing okay, good support system- Mother, [name] and boyfriend's mother at bedside   Objective: BP 115/77   Pulse 88   Temp 98.3 F (36.8 C) (Oral)   Resp 18   Ht 5\' 6"  (1.676 m)   Wt 86.6 kg   LMP 05/13/2018 (Exact Date)   SpO2 99%   BMI 30.83 kg/m  or  Vitals:   02/11/19 0920 02/11/19 0925 02/11/19 1126 02/11/19 1134  BP: (!) 139/101   115/77  Pulse: (!) 131   88  Resp: 18   18  Temp: 98.3 F (36.8 C)     TempSrc: Oral     SpO2:  99%    Weight:   86.6 kg   Height:   5\' 6"  (1.676 m)     Cytotec vaginally  Dilation: Closed Effacement (%): 60 Cervical Position: Posterior Presentation: Vertex Exam by:: Lanice Shirts CNM   Labs: Lab Results  Component Value Date   WBC 7.2 02/11/2019   HGB 11.3 (L) 02/11/2019   HCT 34.4 (L) 02/11/2019   MCV 95.3 02/11/2019   PLT 293 02/11/2019   PLT 295 02/11/2019    Patient Active Problem List   Diagnosis Date Noted  . IUFD at 20 weeks or more of gestation 02/11/2019  . Heartburn during pregnancy in second trimester 09/20/2018  . Supervision of other normal pregnancy, antepartum 07/25/2018  . Migraine headache with aura 04/16/2014    Assessment / Plan: 23 y.o. G2P0101 at [redacted]w[redacted]d here for IOL for IUFD  Labor: Cytotec for IOL  Pain Control:  Pain medication ordered PRN, can have epidural when requested  Anticipated MOD:  SVD IUFD: DIC and [facility] labs pending   Sharyon Cable, CNM 02/11/2019, 11:47 AM

## 2019-02-12 ENCOUNTER — Encounter: Payer: Medicaid Other | Admitting: Advanced Practice Midwife

## 2019-02-12 LAB — RPR: RPR Ser Ql: NONREACTIVE

## 2019-02-12 LAB — ABO/RH: ABO/RH(D): A POS

## 2019-02-13 LAB — TORCH-IGM(TOXO/ RUB/ CMV/ HSV) W TITER
CMV IgM: <30 AU/mL (ref 0.0–29.9)
HSVI/II Comb IgM: 1.29 Ratio — ABNORMAL HIGH (ref 0.00–0.90)
Rubella IgM: <20 AU/mL (ref 0.0–19.9)
Toxoplasma Antibody- IgM: <3 AU/mL (ref 0.0–7.9)

## 2019-02-13 LAB — INFECT DISEASE AB IGM REFLEX 1

## 2019-02-15 ENCOUNTER — Telehealth: Payer: Self-pay | Admitting: Clinical

## 2019-02-15 ENCOUNTER — Telehealth: Payer: Self-pay

## 2019-02-15 NOTE — Telephone Encounter (Signed)
Returned call, no answer, unable to leave vm °

## 2019-02-15 NOTE — Telephone Encounter (Signed)
Attempt to call; no voicemail, no answer, no message left.

## 2019-02-18 ENCOUNTER — Telehealth: Payer: Medicaid Other

## 2019-02-18 NOTE — BH Specialist Note (Deleted)
No answer and no voicemail set up; did not leave message, and did not complete visit.***  ntegrated Behavioral Health Visit via Telemedicine  02/18/2019 Adriana [name] [id number]   Session Start time: 1:01 Session End time: *** Total time: {IBH Total JQDU:43838184}  Referring Provider: *** Type of Visit: Telephonic Patient location: *** [facility] Provider location: *** All persons participating in visit: ***  Confirmed patient's address: {YES/NO:[address]} Confirmed patient's phone number: {YES/NO:21197} Any changes to demographics: {YES/NO:21197}  Confirmed patient's insurance: {YES/NO:21197} Any changes to patient's insurance: {YES/NO:21197}  Discussed confidentiality: {YES/NO:21197}   The following statements were read to the patient and/or legal guardian that are established with the [employer] Provider.  "The purpose of this phone visit is to provide behavioral health care while limiting exposure to the coronavirus (COVID19). "  "By engaging in this telephone visit, you consent to the provision of healthcare.  Additionally, you authorize for your insurance to be billed for the services provided during this telephone visit."   Patient and/or legal guardian consented to telephone visit: {YES/NO:21197}  PRESENTING CONCERNS: Patient and/or family reports the following symptoms/concerns: *** Duration of problem: ***; Severity of problem: {Mild/Moderate/Severe:20260}   GOALS ADDRESSED: Patient will: 1.  Reduce symptoms of: {IBH Symptoms:21014056}  2.  Increase knowledge and/or ability of: {IBH Patient Tools:21014057}  3.  Demonstrate ability to: {IBH Goals:21014053}  INTERVENTIONS: Interventions utilized:  {IBH Interventions:21014054} Standardized Assessments completed: {IBH Screening Tools:21014051}  ASSESSMENT: Patient currently experiencing ***.   Patient may benefit from ***.  PLAN: 1. Follow up with behavioral health clinician on : *** 2. Behavioral  recommendations: *** 3. Referral(s): {IBH Referrals:21014055}  Asher Muir C Sharel Behne

## 2019-02-25 ENCOUNTER — Ambulatory Visit (INDEPENDENT_AMBULATORY_CARE_PROVIDER_SITE_OTHER): Payer: Medicaid Other | Admitting: Advanced Practice Midwife

## 2019-02-25 ENCOUNTER — Other Ambulatory Visit: Payer: Self-pay

## 2019-02-25 DIAGNOSIS — O8612 Endometritis following delivery: Secondary | ICD-10-CM

## 2019-02-25 DIAGNOSIS — R768 Other specified abnormal immunological findings in serum: Secondary | ICD-10-CM

## 2019-02-25 DIAGNOSIS — O364XX0 Maternal care for intrauterine death, not applicable or unspecified: Secondary | ICD-10-CM

## 2019-02-25 DIAGNOSIS — O165 Unspecified maternal hypertension, complicating the puerperium: Secondary | ICD-10-CM

## 2019-02-25 HISTORY — DX: Other specified abnormal immunological findings in serum: R76.8

## 2019-02-25 MED ORDER — FLUCONAZOLE 150 MG PO TABS: 150.0000 mg | ORAL_TABLET | Freq: Once | ORAL | 1 refills | Status: AC

## 2019-02-25 MED ORDER — AMOXICILLIN-POT CLAVULANATE 875-125 MG PO TABS: 1.0000 | ORAL_TABLET | Freq: Two times a day (BID) | ORAL | 0 refills | Status: DC

## 2019-02-25 NOTE — Progress Notes (Signed)
Pt complains of HA's.

## 2019-02-25 NOTE — Patient Instructions (Addendum)
[facility] [place] [address] [place], [place], [place] [address]  Phone: [phone]     Postpartum Depression Support Groups in [place] 1. Feelings After Birth support group, every Tuesday in [place],  [place]- Call Duffy Rhody, at [phone] for more information 2. Hope-Filled Hearts, First Wednesday of each month at noon (feel free to bring a bagged lunch), Third Tuesday of each month at 6:30pm. [facility], 5th Floor [facility], [address], [place], [place]               Contact [place], [place], [place], [place] at [phone]  Postpartum Support International(PSI) Louie Bun at 5307675631 The PSI Louie Bun is a toll-free telephone number anyone can call to get basic information, support, and resources. Dial extension 1 for Spanish and extension 2 for English. The Warmline messages are returned every day of the week. You are welcome to leave a confidential message any time, and one of the warmline volunteers will return your call as soon as possible. If you are not able to talk when the volunteer calls you, you can arrange another time to connect. The volunteer will give you information, encouragement, and names of resources near you.The PSI Warmline is not a crisis hotline and does not handle emergencies. People in crisis should call their physicians, their local emergency number or the National Suicide Prevention Hotline at 1-800-273-TALK (845)569-0376). Apoyo de PSI para las familias hispano parlantes ([phone], #1 Llame al nmero de telfono gratuito para obtener recursos, apoyo e informacin gratuita. Djenos un mensaje y un voluntario le devolver la llamada.  Heartstrings: a community of support for grieving parents. Pregnancy & Infant Loss Support, Child Loss Support for loss of a child(up to 17 years old), Peer Support, Subsequent Pregnancy Support  [phone] or info@heartstringssupport .org  www.heartsstringssupport.org  Suicide Prevention Hotline: (713) 378-3971   Coping with Pregnancy Loss Pregnancy loss can happen any time during a pregnancy. Often the cause is not known. It is rarely because of anything you did. Pregnancy loss in early pregnancy (during the first trimester) is called a miscarriage. This type of pregnancy loss is the most common. Pregnancy loss that happens after 20 weeks of pregnancy is called fetal demise if the baby's heart stops beating before birth. Fetal demise is much less common. Some women experience spontaneous labor shortly after fetal demise resulting in a stillborn birth (stillbirth). Any pregnancy loss can be devastating. You will need to recover both physically and emotionally. Most women are able to get pregnant again after a pregnancy loss and deliver a healthy baby. How to manage emotional recovery  Pregnancy loss is very hard emotionally. You may feel many different emotions while you grieve. You may feel sad and angry. You may also feel guilty. It is normal to have periods of crying. Emotional recovery can take longer than physical recovery. It is different for everyone. Taking these steps can help you cope:  Remember that it is unlikely you did anything to cause the pregnancy loss.  Share your thoughts and feelings with friends, family, and your partner. Remember that your partner is also recovering emotionally.  Make sure you have a good support system, and do not spend too much time alone.  Meet with a pregnancy loss counselor or join a pregnancy loss support group.  Get enough sleep and eat a healthy diet. Return to regular exercise when you have recovered physically.  Do not use drugs or alcohol to manage your emotions.  Consider seeing  a mental health professional to help you recover emotionally.  Ask a friend or loved one to help you decide what to do with any clothing and nursery items you received for your baby. In the case of  a stillbirth, many women benefit from taking additional steps in the grieving process. You may want to:  Hold your baby after the birth.  Name your baby.  Request a birth certificate.  Create a keepsake such as handprints or footprints.  Dress your baby and have a picture taken.  Make funeral arrangements.  Ask for a baptism or blessing. Hospitals have staff members who can help you with all these arrangements. How to recognize emotional stress It is normal to have emotional stress after a pregnancy loss. But emotional stress that lasts a long time or becomes severe requires treatment. Watch out for these signs of severe emotional stress:  Sadness, anger, or guilt that is not going away and is interfering with your normal activities.  Relationship problems that have occurred or gotten worse since the pregnancy loss.  Signs of depression that last longer than 2 weeks. These may include: ? Sadness. ? Anxiety. ? Hopelessness. ? Loss of interest in activities you enjoy. ? Inability to concentrate. ? Trouble sleeping or sleeping too much. ? Loss of appetite or overeating. ? Thoughts of death or of hurting yourself. Follow these instructions at home: Medicines  Take over-the-counter and prescription medicines only as told by your health care provider. Activity  Rest at home until your energy level returns. Return to your normal activities as told by your health care provider. Ask your health care provider what activities are safe for you. General instructions  Keep all follow-up visits as told by your health care provider. This is important.  It may be helpful to meet with others who have experienced pregnancy loss. Ask your health care provider about support groups and resources.  To help you and your partner with the process of grieving, talk with your health care provider or seek counseling.  When you are ready, meet with your health care provider to discuss steps to take  for a future pregnancy. Where to find more information  U.S. Department of Health and Cytogeneticist on Women's Health: http://hoffman.com/  American Pregnancy Association: www.americanpregnancy.org Contact a health care provider if:  You continue to experience grief, sadness, or lack of motivation for everyday activities, and those feelings do not improve over time.  You are struggling to recover emotionally, especially if you are using alcohol or substances to help. Get help right away if:  You have thoughts of hurting yourself or others. If you ever feel like you may hurt yourself or others, or have thoughts about taking your own life, get help right away. You can go to your nearest emergency department or call:  Your local emergency services (911 in the [place].S.).  A suicide crisis helpline, such as the National Suicide Prevention Lifeline at (717) 068-7584. This is open 24 hours a day. Summary  Any pregnancy loss can be difficult physically and emotionally.  You may experience many different emotions while you grieve. Emotional recovery can last longer than physical recovery.  It is normal to have emotional stress after a pregnancy loss. But emotional stress that lasts a long time or becomes severe requires treatment.  See your health care provider if you are struggling emotionally after a pregnancy loss. This information is not intended to replace advice given to you by your health care provider. Make  sure you discuss any questions you have with your health care provider. Document Released: 01/25/2018 Document Revised: 01/25/2018 Document Reviewed: 01/25/2018 Elsevier Interactive Patient Education  2019 [employer]

## 2019-02-25 NOTE — Progress Notes (Signed)
Subjective:     Brayla Pat is a 23 y.o. female who presents for a postpartum visit. She is 2 weeks postpartum following a spontaneous vaginal delivery of term IUFD. I have fully reviewed the prenatal and intrapartum course. The delivery was at 30 gestational weeks. Outcome: spontaneous vaginal delivery. Anesthesia: epidural. Postpartum course has been complicated by yellow vaginal discharge along with lochia and menstrual like cramping.  Bleeding thin lochia. Bowel function is abnormal: with loose stools 2-3 times daily. Bladder function is normal. Patient is not sexually active. Contraception method is none. Postpartum depression screening: negative.  The following portions of the patient's history were reviewed and updated as appropriate: allergies, current medications, past family history, past medical history, past social history, past surgical history and problem list.  Review of Systems Pertinent items noted in HPI and remainder of comprehensive ROS otherwise negative.   Objective:    BP (!) 141/100   Pulse 81   Wt 79.8 kg   LMP 05/13/2018 (Exact Date)   BMI 28.41 kg/m   Retake BP 142/102   VS reviewed, nursing note reviewed,  Constitutional: well developed, well nourished, no distress HEENT: normocephalic CV: normal rate Pulm/chest wall: normal effort Abdomen: soft Neuro: alert and oriented x 3 Skin: warm, dry Psych: affect normal     Assessment/Plan:   1. IUFD at 72 weeks or more of gestation --Pt is doing well, reports good support. --Is interested in counseling, refer to integrated [facility] and info given for [facility] [place]  2. Postpartum examination following vaginal delivery   3. Hypertension, postpartum condition or complication --BP borderline, mild h/a today, no other symptoms. She is not sleeping well. Pt reports drinking some beers last night and is dehydrated. Drinking sports drink in office today.   --No hx HTN, borderline pressures today  with mild h/a so will get labwork and recheck  BP in office in 2 days --Preeclampsia warning signs reviewed - CBC - Comp Met (CMET) - Protein / creatinine ratio, urine  4. Endometritis following delivery --Pt pain and vaginal discharge concerning for infection.  Will treat for possible infection.  Diflucan provided in case abx lead to yeast symptoms.  --Pt to take probiotic during antibiotic course as well. - amoxicillin-clavulanate (AUGMENTIN) 875-125 MG tablet; Take 1 tablet by mouth 2 (two) times daily.  Dispense: 14 tablet; Refill: 0 - fluconazole (DIFLUCAN) 150 MG tablet; Take 1 tablet (150 mg total) by mouth once for 1 dose.  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  Reviewed with pt. No hx of oral or genital outbreaks. No treatment needed currently but recommend prophylaxis in any future pregnancies.    Social/emotional concerns:  IUFD makes increased risk but pt doing well, refer to integrated behavioral health and given contact info for counseling/support.  Contraception: None, desires another pregnancy  Follow up in: 4 weeks or as needed.   Fatima Blank, CNM 11:31 AM

## 2019-02-26 LAB — COMPREHENSIVE METABOLIC PANEL
ALBUMIN: 4.3 g/dL (ref 3.9–5.0)
ALT: 26 IU/L (ref 0–32)
AST: 18 IU/L (ref 0–40)
Albumin/Globulin Ratio: 1.4 (ref 1.2–2.2)
Alkaline Phosphatase: 96 IU/L (ref 39–117)
BUN / CREAT RATIO: 12 (ref 9–23)
BUN: 11 mg/dL (ref 6–20)
Bilirubin Total: 0.3 mg/dL (ref 0.0–1.2)
CO2: 26 mmol/L (ref 20–29)
Calcium: 9.7 mg/dL (ref 8.7–10.2)
Chloride: 103 mmol/L (ref 96–106)
Creatinine, Ser: 0.92 mg/dL (ref 0.57–1.00)
GFR calc non Af Amer: 89 mL/min/{1.73_m2} (ref >59)
GFR, EST AFRICAN AMERICAN: 103 mL/min/{1.73_m2} (ref >59)
Globulin, Total: 3 g/dL (ref 1.5–4.5)
Glucose: 82 mg/dL (ref 65–99)
Potassium: 5.1 mmol/L (ref 3.5–5.2)
SODIUM: 145 mmol/L — AB (ref 134–144)
TOTAL PROTEIN: 7.3 g/dL (ref 6.0–8.5)

## 2019-02-26 LAB — PROTEIN / CREATININE RATIO, URINE
Creatinine, Urine: 77.2 mg/dL
Protein, Ur: 8.4 mg/dL
Protein/Creat Ratio: 109 mg/g creat (ref 0–200)

## 2019-02-26 LAB — CBC
Hematocrit: 39.2 % (ref 34.0–46.6)
Hemoglobin: 13 g/dL (ref 11.1–15.9)
MCH: 32.3 pg (ref 26.6–33.0)
MCHC: 33.2 g/dL (ref 31.5–35.7)
MCV: 97 fL (ref 79–97)
Platelets: 450 10*3/uL (ref 150–450)
RBC: 4.03 x10E6/uL (ref 3.77–5.28)
RDW: 13.1 % (ref 11.7–15.4)
WBC: 5.3 10*3/uL (ref 3.4–10.8)

## 2019-02-27 ENCOUNTER — Other Ambulatory Visit: Payer: Self-pay

## 2019-02-27 ENCOUNTER — Ambulatory Visit: Payer: Medicaid Other

## 2019-02-27 ENCOUNTER — Telehealth: Payer: Self-pay | Admitting: Clinical

## 2019-02-27 ENCOUNTER — Telehealth: Payer: Medicaid Other

## 2019-02-27 VITALS — BP 127/86 | HR 76 | Wt 175.6 lb

## 2019-02-27 DIAGNOSIS — Z0130 Encounter for examination of blood pressure without abnormal findings: Secondary | ICD-10-CM

## 2019-02-27 NOTE — Progress Notes (Signed)
..  Subjective:  Adriana [name] is a 23 y.o. female here for BP check.   Hypertension ROS: taking medications as instructed, no medication side effects noted, no TIA's, no chest pain on exertion, no dyspnea on exertion and no swelling of ankles.    Objective:  BP 127/86   Pulse 76   Wt 175 lb 9.6 oz (79.7 kg)   LMP 05/13/2018 (Exact Date)   BMI 28.34 kg/m   Appearance alert, well appearing, and in no distress. General exam BP noted to be well controlled today in office.    Assessment:   Blood Pressure well controlled.   Plan:  Current treatment plan is effective, no change in therapy.Marland Kitchen

## 2019-02-27 NOTE — BH Specialist Note (Deleted)
Integrated Behavioral Health Visit via Telemedicine (Telephone)  02/27/2019 Elenor Quinones [id number]   Session Start time: ***  Session End time: *** Total time: {IBH Total OINO:67672094}  Referring Provider: *** Type of Visit: Telephonic Patient location: *** [facility] [place] Provider location: *** All persons participating in visit: ***  Confirmed patient's address: {YES/NO:[address]} Confirmed patient's phone number: {YES/NO:21197} Any changes to demographics: {YES/NO:21197}  Confirmed patient's insurance: {YES/NO:21197} Any changes to patient's insurance: {YES/NO:21197}  Discussed confidentiality: {YES/NO:21197}   The following statements were read to the patient and/or legal guardian that are established with the [facility] Provider.  "The purpose of this phone visit is to provide behavioral health care while limiting exposure to the coronavirus (COVID19).  There is a possibility of technology failure and discussed alternative modes of communication if that failure occurs."  "By engaging in this telephone visit, you consent to the provision of healthcare.  Additionally, you authorize for your insurance to be billed for the services provided during this telephone visit."   Patient and/or legal guardian consented to telephone visit: {YES/NO:21197}  PRESENTING CONCERNS: Patient and/or family reports the following symptoms/concerns: *** Duration of problem: ***; Severity of problem: {Mild/Moderate/Severe:20260}  STRENGTHS (Protective Factors/Coping Skills): ***  GOALS ADDRESSED: Patient will: 1.  Reduce symptoms of: {IBH Symptoms:21014056}  2.  Increase knowledge and/or ability of: {IBH Patient Tools:21014057}  3.  Demonstrate ability to: {IBH Goals:21014053}  INTERVENTIONS: Interventions utilized:  {IBH Interventions:21014054} Standardized Assessments completed: {IBH Screening Tools:21014051}  ASSESSMENT: Patient currently experiencing ***.   Patient may benefit from  ***.  PLAN: 1. Follow up with behavioral health clinician on : *** 2. Behavioral recommendations: *** 3. Referral(s): {IBH Referrals:21014055}  Asher Muir C McMannes

## 2019-02-27 NOTE — Telephone Encounter (Signed)
Attempt at telehealth visit, no voicemail set up, unable to leave message. Left MyChart message, as MyChart is active.   Pt may call [phone] to reschedule an appointment with Integrated Behavioral Health Clinician, at [facility]

## 2019-02-28 NOTE — Progress Notes (Signed)
I have reviewed the chart and agree with nursing staff's documentation of this patient's encounter.  Catalina Antigua, MD 02/28/2019 11:19 AM

## 2019-03-11 ENCOUNTER — Ambulatory Visit: Payer: Medicaid Other | Admitting: Advanced Practice Midwife

## 2019-03-11 ENCOUNTER — Telehealth: Payer: Self-pay

## 2019-03-14 NOTE — Telephone Encounter (Signed)
error 

## 2019-05-30 ENCOUNTER — Ambulatory Visit: Payer: Medicaid Other

## 2019-06-17 ENCOUNTER — Other Ambulatory Visit: Payer: Self-pay

## 2019-06-17 ENCOUNTER — Other Ambulatory Visit (HOSPITAL_COMMUNITY)
Admission: RE | Admit: 2019-06-17 | Discharge: 2019-06-17 | Disposition: A | Discharge status: Home / Self Care | Payer: Medicaid Other | Source: Ambulatory Visit | Attending: Obstetrics | Admitting: Obstetrics

## 2019-06-17 ENCOUNTER — Ambulatory Visit: Payer: Medicaid Other

## 2019-06-17 DIAGNOSIS — N898 Other specified noninflammatory disorders of vagina: Secondary | ICD-10-CM | POA: Diagnosis present

## 2019-06-17 MED ORDER — DOXYLAMINE-PYRIDOXINE 10-10 MG PO TBEC: DELAYED_RELEASE_TABLET | ORAL | 2 refills | Status: DC

## 2019-06-17 NOTE — Progress Notes (Addendum)
Ms. Buchanan presents today for UPT. She has no unusual complaints and complains of nausea with vomiting x 1 week. LMP: 05/08/2019    OBJECTIVE: Appears well, in no apparent distress.  OB History    Gravida  3   Para  2   Term  1   Preterm  1   AB      Living  1     SAB      TAB      Ectopic      Multiple  0   Live Births  1          Home UPT Result: Didn't perform per pt In-Office UPT result: Positive  I have reviewed the patient's medical, obstetrical, social, and family histories, and medications.   ASSESSMENT: Positive pregnancy test  PLAN Prenatal care to be completed at:  [facility] sent to pharmacy per protocol for pt to take.  PNV samples given  SUBJECTIVE:  23 y.o. female complains of white vaginal discharge with odor. Denies abnormal vaginal bleeding or significant pelvic pain or fever. No UTI symptoms. Denies history of known exposure to STD.  Patient's last menstrual period was 05/08/2019.  OBJECTIVE:  She appears well, afebrile. Urine dipstick: not done.  ASSESSMENT:  Vaginal Discharge  Vaginal Odor   PLAN:  GC, chlamydia, trichomonas, BVAG, CVAG probe sent to lab. Treatment: To be determined once lab results are received ROV prn if symptoms persist or worsen.

## 2019-06-18 NOTE — Progress Notes (Signed)
Patient seen and assessed by nursing staff during this encounter. I have reviewed the chart and agree with the documentation and plan.  Mora Bellman, MD 06/18/2019 8:42 AM

## 2019-06-20 LAB — CERVICOVAGINAL ANCILLARY ONLY
Bacterial vaginitis: NEGATIVE
Candida vaginitis: NEGATIVE
Chlamydia: NEGATIVE
Neisseria Gonorrhea: NEGATIVE
Trichomonas: NEGATIVE

## 2019-07-07 ENCOUNTER — Inpatient Hospital Stay (HOSPITAL_COMMUNITY)
Admission: AD | Admit: 2019-07-07 | Discharge: 2019-07-07 | Disposition: A | Discharge status: Home / Self Care | Payer: Medicaid Other | Attending: Obstetrics and Gynecology | Admitting: Obstetrics and Gynecology

## 2019-07-07 ENCOUNTER — Encounter (HOSPITAL_COMMUNITY): Payer: Self-pay

## 2019-07-07 ENCOUNTER — Other Ambulatory Visit: Payer: Self-pay

## 2019-07-07 DIAGNOSIS — Z87891 Personal history of nicotine dependence: Secondary | ICD-10-CM | POA: Insufficient documentation

## 2019-07-07 DIAGNOSIS — Z3A08 8 weeks gestation of pregnancy: Secondary | ICD-10-CM | POA: Diagnosis not present

## 2019-07-07 DIAGNOSIS — O210 Mild hyperemesis gravidarum: Secondary | ICD-10-CM | POA: Diagnosis present

## 2019-07-07 LAB — URINALYSIS, ROUTINE W REFLEX MICROSCOPIC
Bilirubin Urine: NEGATIVE
Glucose, UA: 50 mg/dL — AB
Ketones, ur: 80 mg/dL — AB
Nitrite: NEGATIVE
Protein, ur: 100 mg/dL — AB
Specific Gravity, Urine: 1.032 — ABNORMAL HIGH (ref 1.005–1.030)
pH: 5 (ref 5.0–8.0)

## 2019-07-07 MED ORDER — FAMOTIDINE IN NACL 20-0.9 MG/50ML-% IV SOLN
20.0000 mg | Freq: Once | INTRAVENOUS | Status: AC
  Administered 2019-07-07: 20 mg via INTRAVENOUS
  Filled 2019-07-07: qty 50

## 2019-07-07 MED ORDER — FAMOTIDINE 20 MG PO TABS: 20.0000 mg | ORAL_TABLET | Freq: Every day | ORAL | 0 refills | Status: DC

## 2019-07-07 MED ORDER — PROMETHAZINE HCL 25 MG PO TABS: 25.0000 mg | ORAL_TABLET | Freq: Four times a day (QID) | ORAL | 1 refills | Status: DC | PRN

## 2019-07-07 MED ORDER — LACTATED RINGERS IV BOLUS
1000.0000 mL | Freq: Once | INTRAVENOUS | Status: AC
  Administered 2019-07-07: 1000 mL via INTRAVENOUS

## 2019-07-07 MED ORDER — PROMETHAZINE HCL 25 MG/ML IJ SOLN
25.0000 mg | Freq: Once | INTRAMUSCULAR | Status: AC
  Administered 2019-07-07: 25 mg via INTRAVENOUS
  Filled 2019-07-07: qty 1

## 2019-07-07 NOTE — MAU Note (Signed)
Adriana [name] is a 23 y.o. at [redacted]w[redacted]d here in MAU reporting: states she is very nauseated. Was taking diclegis but states it was causing her to many side effects so she stopped taking it. Reports she vomited 6 times today. Has back pain and abdominal pain sometimes but is not having this currently. No bleeding.  Onset of complaint: ongoing  Pain score: 0/10  Vitals:   07/07/19 1600  BP: 130/86  Pulse: 83  Resp: 16  Temp: 98.5 F (36.9 C)  SpO2: 100%      Lab orders placed from triage: UA

## 2019-07-07 NOTE — MAU Provider Note (Signed)
Chief Complaint: Nausea and Emesis   First Provider Initiated Contact with Patient 07/07/19 1801     SUBJECTIVE HPI: Adriana [name] is a 23 y.o. G3P1101 at 6759w4d who presents to Maternity Admissions reporting nausea & vomiting. Started when she found out she was pregnant. Was prescribe diclegis but states it didn't work & she didn't like the side effects (jittery, sob, headache). States vomits 6+ times per day. Has some heartburn with n/v. Denies abdominal pain or vaginal bleeding. [facility] this [facility].    Past Medical History:  Diagnosis Date  . Migraines    OB History  Gravida Para Term Preterm AB Living  3 2 1 1   1   SAB TAB Ectopic Multiple Live Births        0 1    # Outcome Date GA Lbr Len/2nd Weight Sex Delivery Anes PTL Lv  3 Current           2 Term 02/11/19 373w1d 00:40 / 00:18 3000 g F Vag-Spont EPI  FD  1 Preterm 02/09/17 5041w5d 21:39 / 01:44 2915 g F Vag-Spont EPI  LIV     Birth Comments: WNL   Past Surgical History:  Procedure Laterality Date  . HERNIA REPAIR     as an infant   Social History   Socioeconomic History  . Marital status: Single    Spouse name: Not on file  . Number of children: 1  . Years of education: Not on file  . Highest education level: Not on file  Occupational History    Comment: non employeed  Social Needs  . Financial resource strain: Not hard at all  . Food insecurity    Worry: Never true    Inability: Never true  . Transportation needs    Medical: No    Non-medical: No  Tobacco Use  . Smoking status: Former Smoker    Quit date: 06/30/2016    Years since quitting: 3.0  . Smokeless tobacco: Never Used  Substance and Sexual Activity  . Alcohol use: No    Alcohol/week: 0.0 standard drinks  . Drug use: No  . Sexual activity: Yes    Birth control/protection: None  Lifestyle  . Physical activity    Days per week: Not on file    Minutes per session: Not on file  . Stress: Not on file  Relationships   . Social Musicianconnections    Talks on phone: Not on file    Gets together: Not on file    Attends religious service: Not on file    Active member of club or organization: Not on file    Attends meetings of clubs or organizations: Not on file    Relationship status: Not on file  . Intimate partner violence    Fear of current or ex partner: No    Emotionally abused: No    Physically abused: No    Forced sexual activity: No  Other Topics Concern  . Not on file  Social History Narrative  . Not on file   History reviewed. No pertinent family history. No current facility-administered medications on file prior to encounter.    Current Outpatient Medications on File Prior to Encounter  Medication Sig Dispense Refill  . amoxicillin-clavulanate (AUGMENTIN) 875-125 MG tablet Take 1 tablet by mouth 2 (two) times daily. (Patient not taking: Reported on 06/17/2019) 14 tablet 0  . Doxylamine-Pyridoxine (DICLEGIS) 10-10 MG TBEC Take 2 tabs qhs then add 1 tab A.M and  midday prn 60 tablet 2  . ferrous sulfate (FERROUSUL) 325 (65 FE) MG tablet Take 1 tablet (325 mg total) by mouth daily with breakfast. (Patient not taking: Reported on 02/11/2019) 30 tablet 5  . omeprazole (PRILOSEC) 20 MG capsule Take 1 capsule (20 mg total) by mouth 2 (two) times daily before a meal. (Patient not taking: Reported on 02/27/2019) 60 capsule 5   Allergies  Allergen Reactions  . Imitrex [Sumatriptan] Other (See Comments)    Chest pain, heaviness, migraine  . Latex Rash    I have reviewed patient's Past Medical Hx, Surgical Hx, Family Hx, Social Hx, medications and allergies.   Review of Systems  Constitutional: Negative.   Gastrointestinal: Positive for nausea and vomiting. Negative for abdominal pain, constipation and diarrhea.  Genitourinary: Negative.     OBJECTIVE Patient Vitals for the past 24 hrs:  BP Temp Temp src Pulse Resp SpO2 Height Weight  07/07/19 1600 130/86 98.5 F (36.9 C) Oral 83 16 100 % - -   07/07/19 1556 - - - - - - 5\' 5"  (1.651 m) 77.1 kg   Constitutional: Well-developed, well-nourished female in no acute distress.  Cardiovascular: normal rate & rhythm, no murmur Respiratory: normal rate and effort. Lung sounds clear throughout GI: Abd soft, non-tender, Pos BS x 4. No guarding or rebound tenderness MS: Extremities nontender, no edema, normal ROM Neurologic: Alert and oriented x 4.     LAB RESULTS Results for orders placed or performed during the hospital encounter of 07/07/19 (from the past 24 hour(s))  Urinalysis, Routine w reflex microscopic     Status: Abnormal   Collection Time: 07/07/19  3:38 PM  Result Value Ref Range   Color, Urine AMBER (A) YELLOW   APPearance CLOUDY (A) CLEAR   Specific Gravity, Urine 1.032 (H) 1.005 - 1.030   pH 5.0 5.0 - 8.0   Glucose, UA 50 (A) NEGATIVE mg/dL   Hgb urine dipstick SMALL (A) NEGATIVE   Bilirubin Urine NEGATIVE NEGATIVE   Ketones, ur 80 (A) NEGATIVE mg/dL   Protein, ur 161100 (A) NEGATIVE mg/dL   Nitrite NEGATIVE NEGATIVE   Leukocytes,Ua TRACE (A) NEGATIVE   RBC / HPF 0-5 0 - 5 RBC/hpf   WBC, UA 6-10 0 - 5 WBC/hpf   Bacteria, UA RARE (A) NONE SEEN   Squamous Epithelial / LPF 6-10 0 - 5   Mucus PRESENT    Ca Oxalate Crys, UA PRESENT     IMAGING No results found.  MAU COURSE Orders Placed This Encounter  Procedures  . Urinalysis, Routine w reflex microscopic  . Discharge patient   Meds ordered this encounter  Medications  . lactated ringers bolus 1,000 mL  . promethazine (PHENERGAN) injection 25 mg  . famotidine (PEPCID) IVPB 20 mg premix  . promethazine (PHENERGAN) 25 MG tablet    Sig: Take 1 tablet (25 mg total) by mouth every 6 (six) hours as needed for nausea or vomiting.    Dispense:  30 tablet    Refill:  1    Order Specific Question:   Supervising Provider    Answer:   ERVIN, MICHAEL L [[id number]]  . famotidine (PEPCID) 20 MG tablet    Sig: Take 1 tablet (20 mg total) by mouth daily.    Dispense:  30  tablet    Refill:  0    Order Specific Question:   Supervising Provider    Answer:   Alysia PennaERVIN, MICHAEL L [[id number]]    MDM No abdominal pain or vaginal  bleeding U/a c/w dehydration Patient reports improvement in symptoms after IV fluids, phenergan, & pepcid  ASSESSMENT 1. Morning sickness   2. [redacted] weeks gestation of pregnancy     PLAN Discharge home in stable condition. Discussed reasons to return to MAU Rx phenergan & pepcid Start prenatal care -- given list of providers. Unsure of returning to [place] d/t recent IUFD.   Follow-up Information    [facility] Follow up.   Specialty: Obstetrics and Gynecology Why: return for worsening symptoms Contact information: [address] [id number] [place] [phone]         Allergies as of 07/07/2019      Reactions   Imitrex [sumatriptan] Other (See Comments)   Chest pain, heaviness, migraine   Latex Rash      Medication List    STOP taking these medications   amoxicillin-clavulanate 875-125 MG tablet Commonly known as: AUGMENTIN   Doxylamine-Pyridoxine 10-10 MG Tbec Commonly known as: Diclegis   ferrous sulfate 325 (65 FE) MG tablet Commonly known as: FerrouSul   omeprazole 20 MG capsule Commonly known as: PriLOSEC     TAKE these medications   famotidine 20 MG tablet Commonly known as: PEPCID Take 1 tablet (20 mg total) by mouth daily.   promethazine 25 MG tablet Commonly known as: PHENERGAN Take 1 tablet (25 mg total) by mouth every 6 (six) hours as needed for nausea or vomiting.        Jorje Guild, NP 07/07/2019  8:09 PM

## 2019-07-07 NOTE — Discharge Instructions (Signed)
[place] for [place] at [facility]       Phone: [phone]  [facility] [place] [facility] [place] Phone: [place] for [employer] [facility] [place]  Phone: [place] for [place] at [facility]  Phone: [place] for [place] [facility] [place]  Phone: [facility]       Phone: [phone]  [facility]    Phone: [phone]   [facility])    Phone: [facility]    Phone: [phone]  [facility]    Phone: [place] [facility]  Phone: [place]    Phone: [phone]  [facility]   Phone: [phone]  [facility]    Phone: [phone]       Childbirth Education Options: [facility] Department Classes:  Childbirth education classes can help you get ready for a positive parenting experience. You can also meet other expectant parents and get free stuff for your baby. Each class runs for five weeks on the same night and costs $45 for the mother-to-be and her support person. Medicaid covers the cost if you are eligible. Call [phone] to register. [facility] Childbirth Education:  [phone] or [phone] or [email or web address]  Baby & Me Class: Discuss newborn & infant parenting and family adjustment issues with other new mothers in a relaxed environment. Each week brings a new speaker or baby-centered activity. We encourage new mothers to join [place] every Thursday at 11:00am. Babies birth until crawling. No registration or fee. Daddy [employer]: This course offers Dads-to-be the tools and knowledge needed to feel confident on their journey to becoming new fathers. Experienced dads, who  have been trained as coaches, teach dads-to-be how to hold, comfort, diaper, swaddle and play with their infant while being able to support the new mom as well. A class for men taught by men. $25/dad Big Brother/Big Sister: Let your children share in the joy of a new brother or sister in this special class designed just for them. Class includes discussion about how families care for babies: swaddling, holding, diapering, safety as well as how they can be helpful in their new role. This class is designed for children ages 87 to 90, but any age is welcome. Please register each child individually. $5/child  Mom Talk: This mom-led group offers support and connection to mothers as they journey through the adjustments and struggles of that sometimes overwhelming first year after the birth of a child. Tuesdays at 10:00am and Thursdays at 6:00pm. Babies welcome. No registration or fee. Breastfeeding Support Group: This group is a mother-to-mother support circle where moms have the opportunity to share their breastfeeding experiences. A Lactation Consultant is present for questions and concerns. Meets each Tuesday at 11:00am. No fee or registration. Breastfeeding Your Baby: Learn what to expect in the first days of breastfeeding your newborn.  This class will help you feel more confident with the skills needed to begin your breastfeeding experience. Many new mothers are concerned about breastfeeding after leaving the hospital. This class will also address the most common fears and challenges about breastfeeding during the first few weeks, months and beyond. (call for fee) Comfort Techniques and Tour: This 2 hour interactive class will provide you the opportunity to learn & practice hands-on techniques that can help relieve some of the  discomfort of labor and encourage your baby to rotate toward the best position for birth. You and your partner will be able to try a variety of labor positions with birth balls and rebozos  as well as practice breathing, relaxation, and visualization techniques. A tour of the [facility] is included with this class. $20 per registrant and support person Childbirth Class- Weekend Option: This class is a Weekend version of our Birth & Baby series. It is designed for parents who have a difficult time fitting several weeks of classes into their schedule. It covers the care of your newborn and the basics of labor and childbirth. It also includes a [place] [facility] and lunch. The class is held two consecutive days: beginning on Friday evening from 6:30 - 8:30 p.m. and the next day, Saturday from 9 a.m. - 4 p.m. (call for fee) Doren Custard Class: Interested in a waterbirth?  This informational class will help you discover whether waterbirth is the right fit for you. Education about waterbirth itself, supplies you would need and how to assemble your support team is what you can expect from this class. Some obstetrical practices require this class in order to pursue a waterbirth. (Not all obstetrical practices offer waterbirth-check with your healthcare provider.) Register only the expectant mom, but you are encouraged to bring your partner to class! Required if planning waterbirth, no fee. Infant/Child CPR: Parents, grandparents, babysitters, and friends learn Cardio-Pulmonary Resuscitation skills for infants and children. You will also learn how to treat both conscious and unconscious choking in infants and children. This Family & Friends program does not offer certification. Register each participant individually to ensure that enough mannequins are available. (Call for fee) Grandparent Love: Expecting a grandbaby? This class is for you! Learn about the latest infant care and safety recommendations and ways to support your own child as he or she transitions into the parenting role. Taught by Registered Nurses who are childbirth instructors, but most  importantly...they are grandmothers too! $10/person. Childbirth Class- Natural Childbirth: This series of 5 weekly classes is for expectant parents who want to learn and practice natural methods of coping with the process of labor and childbirth. Relaxation, breathing, massage, visualization, role of the partner, and helpful positioning are highlighted. Participants learn how to be confident in their body's ability to give birth. This class will empower and help parents make informed decisions about their own care. Includes discussion that will help new parents transition into the immediate postpartum period. [facility] is included. We suggest taking this class between 25-32 weeks, but it's only a recommendation. $75 per registrant and one support person or $30 Medicaid. Childbirth Class- 3 week Series: This option of 3 weekly classes helps you and your labor partner prepare for childbirth. Newborn care, labor & birth, cesarean birth, pain management, and comfort techniques are discussed and a [place] [facility] is included. The class meets at the same time, on the same day of the week for 3 consecutive weeks beginning with the starting date you choose. $60 for registrant and one support person.  Marvelous Multiples: Expecting twins, triplets, or more? This class covers the differences in labor, birth, parenting, and breastfeeding issues that face multiples parents. NICU tour is included. Led by a Certified Childbirth Educator who is the mother of twins. No fee. Caring for Baby: This class is for expectant and adoptive parents who want to learn and practice  the most up-to-date newborn care for their babies. Focus is on birth through the first six weeks of life. Topics include feeding, bathing, diapering, crying, umbilical cord care, circumcision care and safe sleep. Parents learn to recognize symptoms of illness and when to call the pediatrician.  Register only the mom-to-be and your partner or support person can plan to come with you! $10 per registrant and support person Childbirth Class- online option: This online class offers you the freedom to complete a Birth and Baby series in the comfort of your own home. The flexibility of this option allows you to review sections at your own pace, at times convenient to you and your support people. It includes additional video information, animations, quizzes, and extended activities. Get organized with helpful eClass tools, checklists, and trackers. Once you register online for the class, you will receive an email within a few days to accept the invitation and begin the class when the time is right for you. The content will be available to you for 60 days. $60 for 60 days of online access for you and your support people.  Local Doulas: Natural Baby Doulas [email or web address] Tel: [phone] [email or web address] [phone] [email or web address] The Labor Hassell Halim  (also do waterbirth tub rental) [phone] [email or web address] [email or web address] Birth Doula [phone] [email or web address].fi [facility] Rhythms  [phone] [email or web address] [employer] (PADA) [email or web address] [place] Birth and Baby  [email or web address] Considering Waterbirth? Guide for patients at [facility]  Why consider waterbirth?   Gentle birth for babies  Less pain medicine used in labor  May allow for passive descent/less pushing  May reduce perineal tears   More mobility and instinctive maternal position changes  Increased maternal relaxation  Reduced blood pressure in labor  Is waterbirth safe? What are the risks of infection, drowning or other  complications?   Infection: o Very low risk (3.7 % for tub vs 4.8% for bed) o 7 in 8000 waterbirths with documented infection o Poorly cleaned equipment most common cause o Slightly lower group B strep transmission rate   Drowning o Maternal:  - Very low risk   - Related to seizures or fainting o Newborn:  - Very low risk. No evidence of increased risk of respiratory problems in multiple large studies - Physiological protection from breathing under water - Avoid underwater birth if there are any fetal complications - Once babys head is out of the water, keep it out.   Birth complication o Some reports of cord trauma, but risk decreased by bringing baby to surface gradually o No evidence of increased risk of shoulder dystocia. Mothers can usually change positions faster in water than in a bed, possibly aiding the maneuvers to free the shoulder.   You must attend a Doren Custard class at [facility]  3rd Wednesday of every month from 7-9pm  [employer] by calling [phone] or online at [email or web address]  Bring [place] the certificate from the class to your prenatal appointment  Meet with a midwife at 36 weeks to see if you can still plan a waterbirth and to sign the consent.   Purchase or rent the following supplies:   Water Birth Pool (Birth Pool in a Box or [place] for instance)  (Tubs start ~$125)  Single-use disposable tub liner designed for your brand of tub  New garden hose labeled "lead-free", suitable for drinking water",  Electric drain pump to remove water (We recommend 792 gallon per hour or greater pump.)   Separate garden hose to  remove the dirty water  Fish net  Bathing suit top (optional)  Long-handled mirror (optional)  Places to purchase or rent supplies  GotWebTools.is for tub purchases and supplies  Waterbirthsolutions.com for tub purchases and supplies  The Labor Ladies ([email or web address]) $275 for tub rental/set-up  & take down/kit   [employer] (http://www.[place].com/.htm) Information regarding doulas (labor support) who provide pool rentals  Our practice has a Birth Pool in a Box tub at the hospital that you may borrow on a first-come-first-served basis. It is your responsibility to to set up, clean and break down the tub. We cannot guarantee the availability of this tub in advance. You are responsible for bringing all accessories listed above. If you do not have all necessary supplies you cannot have a waterbirth.    Things that would prevent you from having a waterbirth:  Premature, <37wks  Previous cesarean birth  Presence of thick meconium-stained fluid  Multiple gestation (Twins, triplets, etc.)  Uncontrolled diabetes or gestational diabetes requiring medication  Hypertension requiring medication or diagnosis of pre-eclampsia  Heavy vaginal bleeding  Non-reassuring fetal heart rate  Active infection (MRSA, etc.). Group B Strep is NOT a contraindication for  waterbirth.  If your labor has to be induced and induction method requires continuous  monitoring of the baby's heart rate  Other risks/issues identified by your obstetrical provider  Please remember that birth is unpredictable. Under certain unforeseeable circumstances your provider may advise against giving birth in the tub. These decisions will be made on a case-by-case basis and with the safety of you and your baby as our highest priority.        Morning Sickness  Morning sickness is when a woman feels nauseous during pregnancy. This nauseous feeling may or may not come with vomiting. It often occurs in the morning, but it can be a problem at any time of day. Morning sickness is most common during the first trimester. In some cases, it may continue throughout pregnancy. Although morning sickness is unpleasant, it is usually harmless unless the woman develops severe and continual vomiting  (hyperemesis gravidarum), a condition that requires more intense treatment. What are the causes? The exact cause of this condition is not known, but it seems to be related to normal hormonal changes that occur in pregnancy. What increases the risk? You are more likely to develop this condition if:  You experienced nausea or vomiting before your pregnancy.  You had morning sickness during a previous pregnancy.  You are pregnant with more than one baby, such as twins. What are the signs or symptoms? Symptoms of this condition include:  Nausea.  Vomiting. How is this diagnosed? This condition is usually diagnosed based on your signs and symptoms. How is this treated? In many cases, treatment is not needed for this condition. Making some changes to what you eat may help to control symptoms. Your health care provider may also prescribe or recommend:  Vitamin B6 supplements.  Anti-nausea medicines.  Ginger. Follow these instructions at home: Medicines  Take over-the-counter and prescription medicines only as told by your health care provider. Do not use any prescription, over-the-counter, or herbal medicines for morning sickness without first talking with your health care provider.  Taking multivitamins before getting pregnant can prevent or decrease the severity of morning sickness in most women. Eating and drinking  Eat a piece of dry toast or crackers before getting out of bed in the morning.  Eat 5 or 6 small meals a day.  Eat  dry and bland foods, such as rice or a baked potato. Foods that are high in carbohydrates are often helpful.  Avoid greasy, fatty, and spicy foods.  Have someone cook for you if the smell of any food causes nausea and vomiting.  If you feel nauseous after taking prenatal vitamins, take the vitamins at night or with a snack.  Snack on protein foods between meals if you are hungry. Nuts, yogurt, and cheese are good options.  Drink fluids throughout  the day.  Try ginger ale made with real ginger, ginger tea made from fresh grated ginger, or ginger candies. General instructions  Do not use any products that contain nicotine or tobacco, such as cigarettes and e-cigarettes. If you need help quitting, ask your health care provider.  Get an air purifier to keep the air in your house free of odors.  Get plenty of fresh air.  Try to avoid odors that trigger your nausea.  Consider trying these methods to help relieve symptoms: ? Wearing an acupressure wristband. These wristbands are often worn for seasickness. ? Acupuncture. Contact a health care provider if:  Your home remedies are not working and you need medicine.  You feel dizzy or light-headed.  You are losing weight. Get help right away if:  You have persistent and uncontrolled nausea and vomiting.  You faint.  You have severe pain in your abdomen. Summary  Morning sickness is when a woman feels nauseous during pregnancy. This nauseous feeling may or may not come with vomiting.  Morning sickness is most common during the first trimester.  It often occurs in the morning, but it can be a problem at any time of day.  In many cases, treatment is not needed for this condition. Making some changes to what you eat may help to control symptoms. This information is not intended to replace advice given to you by your health care provider. Make sure you discuss any questions you have with your health care provider. Document Released: 01/05/2007 Document Revised: 10/27/2017 Document Reviewed: 12/17/2016 Elsevier Patient Education  2020 [employer].

## 2019-07-16 ENCOUNTER — Telehealth: Payer: Medicaid Other

## 2019-07-24 ENCOUNTER — Encounter: Payer: Medicaid Other | Admitting: Certified Nurse Midwife

## 2019-08-05 ENCOUNTER — Other Ambulatory Visit: Payer: Self-pay | Admitting: Student

## 2019-08-11 ENCOUNTER — Encounter: Payer: Self-pay | Admitting: Family Medicine

## 2019-08-11 DIAGNOSIS — O09899 Supervision of other high risk pregnancies, unspecified trimester: Secondary | ICD-10-CM | POA: Insufficient documentation

## 2019-08-14 ENCOUNTER — Other Ambulatory Visit: Payer: Self-pay | Admitting: Family Medicine

## 2019-08-14 ENCOUNTER — Other Ambulatory Visit: Payer: Self-pay

## 2019-08-14 ENCOUNTER — Ambulatory Visit (INDEPENDENT_AMBULATORY_CARE_PROVIDER_SITE_OTHER): Payer: Medicaid Other | Admitting: Family Medicine

## 2019-08-14 ENCOUNTER — Encounter: Payer: Self-pay | Admitting: Family Medicine

## 2019-08-14 ENCOUNTER — Other Ambulatory Visit (HOSPITAL_COMMUNITY)
Admission: RE | Admit: 2019-08-14 | Discharge: 2019-08-14 | Disposition: A | Discharge status: Home / Self Care | Payer: Medicaid Other | Source: Ambulatory Visit | Attending: Family Medicine | Admitting: Family Medicine

## 2019-08-14 VITALS — BP 122/77 | HR 89 | Temp 98.4°F | Wt 183.4 lb

## 2019-08-14 DIAGNOSIS — O09892 Supervision of other high risk pregnancies, second trimester: Secondary | ICD-10-CM

## 2019-08-14 DIAGNOSIS — O09212 Supervision of pregnancy with history of pre-term labor, second trimester: Secondary | ICD-10-CM

## 2019-08-14 DIAGNOSIS — Z3481 Encounter for supervision of other normal pregnancy, first trimester: Secondary | ICD-10-CM

## 2019-08-14 DIAGNOSIS — O09292 Supervision of pregnancy with other poor reproductive or obstetric history, second trimester: Secondary | ICD-10-CM

## 2019-08-14 DIAGNOSIS — O09899 Supervision of other high risk pregnancies, unspecified trimester: Secondary | ICD-10-CM

## 2019-08-14 DIAGNOSIS — R768 Other specified abnormal immunological findings in serum: Secondary | ICD-10-CM

## 2019-08-14 DIAGNOSIS — Z8751 Personal history of pre-term labor: Secondary | ICD-10-CM

## 2019-08-14 DIAGNOSIS — Z3A14 14 weeks gestation of pregnancy: Secondary | ICD-10-CM

## 2019-08-14 HISTORY — DX: Personal history of pre-term labor: Z87.51

## 2019-08-14 MED ORDER — BLOOD PRESSURE KIT DEVI: 1.0000 | 0 refills | Status: DC

## 2019-08-14 MED ORDER — ASPIRIN EC 81 MG PO TBEC: 81.0000 mg | DELAYED_RELEASE_TABLET | Freq: Every day | ORAL | 11 refills | Status: DC

## 2019-08-14 MED ORDER — POLYETHYLENE GLYCOL 3350 17 GM/SCOOP PO POWD: 1.0000 | Freq: Once | ORAL | 1 refills | Status: AC

## 2019-08-14 NOTE — Progress Notes (Signed)
NEW OB.  Declined FLU.  C/o headaches, constipation.

## 2019-08-14 NOTE — Progress Notes (Addendum)
Subjective:  Adriana [name] is a 23 y.o. G3P1101 at 27w0dbeing seen today for ongoing prenatal care.  She is currently monitored for the following issues for this high-risk pregnancy and has Migraine headache with aura; Prior pregnancy with fetal demise and current pregnancy; HSV-2 seropositive; Supervision of other high risk pregnancy, antepartum; and History of preterm delivery on their problem list.  Patient reports no complaints.  Contractions: Not present. Vag. Bleeding: None.   . Denies leaking of fluid.   The following portions of the patient's history were reviewed and updated as appropriate: allergies, current medications, past family history, past medical history, past social history, past surgical history and problem list. Problem list updated.  Objective:   Vitals:   08/14/19 1615  BP: 122/77  Pulse: 89  Temp: 98.4 F (36.9 C)  Weight: 183 lb 6.4 oz (83.2 kg)    Fetal Status:           General:  Alert, oriented and cooperative. Patient is in no acute distress.  Skin: Skin is warm and dry. No rash noted.   Cardiovascular: Normal heart rate noted  Respiratory: Normal respiratory effort, no problems with respiration noted  Abdomen: Soft, gravid, appropriate for gestational age. Pain/Pressure: Absent     Pelvic: Vag. Bleeding: None     SSE with normal multiparous os, unremarkable vaginal mucosa without exudate        Extremities: Normal range of motion.  Edema: None  Mental Status: Normal mood and affect. Normal behavior. Normal judgment and thought content.   Urinalysis:      Assessment and Plan:  Pregnancy: G3P1101 at 189w0d1. Supervision of other high risk pregnancy, antepartum - Routine prenatal labs as below - Clarified w patient LMP was 05/03/2019, updated in chart. Reports regular periods over past several months, can assess dating with anatomy scan - PL updated - RTC in 4 weeks - order anatomy scan at next visit - declined flu today  - Cytology - PAP(  [place])  - Cervicovaginal ancillary only( [place]) - Obstetric Panel, Including HIV - Culture, OB Urine - Genetic Screening - Enroll Patient in Babyscripts - Babyscripts Schedule Optimization - Blood Pressure Monitoring (BLOOD PRESSURE KIT) DEVI; 1 kit by Does not apply route once a week. Check BP Weekly.  Large Cuff.  Dx:Z13.6  Z34.86  O09.0  Dispense: 1 kit; Refill: 0 - Flu Vaccine QUAD 36+ mos IM (Fluarix, Quad PF) - aspirin EC 81 MG tablet; Take 1 tablet (81 mg total) by mouth daily.  Dispense: 30 tablet; Refill: 11 - polyethylene glycol powder (GLYCOLAX/MIRALAX) 17 GM/SCOOP powder; Take 255 g by mouth once for 1 dose.  Dispense: 255 g; Refill: 1 - HgB A1c  2. Prior pregnancy with fetal demise and current pregnancy in second trimester - Feeling anxious about this pregnancy but still excited - Many questions regarding changes to management, reviewed increased antenatal testing and IOL at 39 wks - Review of records shows TORCH labs with elevated HSV I/II IgM but I can not find any autopsy or other genetic testing - Given unexplained IUFD early DM tesing w A1c today - Start prenatal ASA today  3. HSV-2 seropositive Will need prophylaxis  4. History of preterm delivery Discussed Makena given hx of PPROM and PTD at 36 weeks. She is undecided and concerned that she will not be able to make it to clinic regularly for injections. Given information and encouraged to investigate further, she will discuss with her partner and make decision  at next visit  Preterm labor symptoms and general obstetric precautions including but not limited to vaginal bleeding, contractions, leaking of fluid and fetal movement were reviewed in detail with the patient. Please refer to After Visit Summary for other counseling recommendations.  Return in about 4 weeks (around 09/11/2019) for [facility], needs MD, in person.   Clarnce Flock, MD

## 2019-08-14 NOTE — Patient Instructions (Signed)
Hydroxyprogesterone caproate injection for pregnancy What is this medicine? HYDROXYPROGESTERONE (hye drox ee proe JES ter one) is a female hormone. This medicine is used in women who are pregnant and who have delivered a baby too early (preterm) in the past. It helps lower the risk of having a preterm baby again. This medicine may be used for other purposes; ask your health care provider or pharmacist if you have questions. COMMON BRAND NAME(S): Makena What should I tell my health care provider before I take this medicine? They need to know if you have any of these conditions:  breast, cervical, uterine, or vaginal cancer  depression  diabetes or prediabetes  heart disease  high blood pressure  history of blood clots  kidney disease  liver disease  lung or breathing disease, like asthma  migraine headaches  seizures  vaginal bleeding  an unusual or allergic reaction to hydroxyprogesterone, other hormones, castor oil, benzyl alcohol, other medicines, foods, dyes, or preservatives  breast-feeding How should I use this medicine? This medicine is for injection into a muscle or under the skin. You will receive an injection once every week (every 7 days) as directed during your pregnancy. It is given by a health care professional in a hospital or clinic setting. Talk to your pediatrician regarding the use of this medicine in children. While this drug may be prescribed for pregnant women as young as 16 years, precautions do apply. Overdosage: If you think you have taken too much of this medicine contact a poison control center or emergency room at once. NOTE: This medicine is only for you. Do not share this medicine with others. What if I miss a dose? It is important not to miss your dose. Call your doctor or health care professional if you are unable to keep an appointment. What may interact with this medicine? Significant interactions are not expected. This list may not  describe all possible interactions. Give your health care provider a list of all the medicines, herbs, non-prescription drugs, or dietary supplements you use. Also tell them if you smoke, drink alcohol, or use illegal drugs. Some items may interact with your medicine. What should I watch for while using this medicine? Your pregnancy will be monitored carefully while you are receiving this medicine. What side effects may I notice from receiving this medicine? Side effects that you should report to your doctor or health care professional as soon as possible:  allergic reactions like skin rash, itching or hives, swelling of the face, lips, or tongue  breathing problems  depressed mood  increase in blood pressure  increased hunger or thirst  increased urination  signs and symptoms of a blood clot such as breathing problems; changes in vision; chest pain; severe, sudden headache; pain, swelling, warmth in the leg; trouble speaking; sudden numbness or weakness of the face, arm or leg  unusually weak or tired  unusual vaginal bleeding  yellowing of the eyes or skin Side effects that usually do not require medical attention (report to your doctor or health care professional if they continue or are bothersome):  diarrhea  fluid retention and swelling  nausea  pain, redness, or irritation at site where injected This list may not describe all possible side effects. Call your doctor for medical advice about side effects. You may report side effects to FDA at 1-800-FDA-1088. Where should I keep my medicine? This drug is given in a hospital or clinic and will not be stored at home. NOTE: This sheet is a   summary. It may not cover all possible information. If you have questions about this medicine, talk to your doctor, pharmacist, or health care provider.  2020 Elsevier/Gold Standard (2017-07-30 11:14:47)   

## 2019-08-15 LAB — OBSTETRIC PANEL, INCLUDING HIV
Antibody Screen: NEGATIVE
Basophils Absolute: 0 10*3/uL (ref 0.0–0.2)
Basos: 0 %
EOS (ABSOLUTE): 0 10*3/uL (ref 0.0–0.4)
Eos: 0 %
HIV Screen 4th Generation wRfx: NONREACTIVE
Hematocrit: 33 % — ABNORMAL LOW (ref 34.0–46.6)
Hemoglobin: 11.3 g/dL (ref 11.1–15.9)
Hepatitis B Surface Ag: NEGATIVE
Immature Grans (Abs): 0 10*3/uL (ref 0.0–0.1)
Immature Granulocytes: 0 %
Lymphocytes Absolute: 2.1 10*3/uL (ref 0.7–3.1)
Lymphs: 32 %
MCH: 33.4 pg — ABNORMAL HIGH (ref 26.6–33.0)
MCHC: 34.2 g/dL (ref 31.5–35.7)
MCV: 98 fL — ABNORMAL HIGH (ref 79–97)
Monocytes Absolute: 0.5 10*3/uL (ref 0.1–0.9)
Monocytes: 8 %
Neutrophils Absolute: 3.7 10*3/uL (ref 1.4–7.0)
Neutrophils: 60 %
Platelets: 315 10*3/uL (ref 150–450)
RBC: 3.38 x10E6/uL — ABNORMAL LOW (ref 3.77–5.28)
RDW: 13.5 % (ref 11.7–15.4)
RPR Ser Ql: NONREACTIVE
Rh Factor: POSITIVE
Rubella Antibodies, IGG: 4.46 index (ref >0.99)
WBC: 6.4 10*3/uL (ref 3.4–10.8)

## 2019-08-15 LAB — HEMOGLOBIN A1C
Est. average glucose Bld gHb Est-mCnc: 97 mg/dL
Hgb A1c MFr Bld: 5 % (ref 4.8–5.6)

## 2019-08-15 MED ORDER — BLOOD PRESSURE KIT DEVI: 1.0000 | 0 refills | Status: DC

## 2019-08-15 NOTE — Addendum Note (Signed)
Addended by: Tamela Oddi on: 08/15/2019 04:59 PM   Modules accepted: Orders

## 2019-08-16 ENCOUNTER — Other Ambulatory Visit: Payer: Self-pay

## 2019-08-16 ENCOUNTER — Other Ambulatory Visit: Payer: Medicaid Other

## 2019-08-16 DIAGNOSIS — O09899 Supervision of other high risk pregnancies, unspecified trimester: Secondary | ICD-10-CM

## 2019-08-16 LAB — CERVICOVAGINAL ANCILLARY ONLY
Bacterial Vaginitis (gardnerella): NEGATIVE
Candida Glabrata: NEGATIVE
Candida Vaginitis: NEGATIVE
Molecular Disclaimer: NEGATIVE
Molecular Disclaimer: NEGATIVE
Molecular Disclaimer: NEGATIVE
Molecular Disclaimer: NORMAL
Trichomonas: NEGATIVE

## 2019-08-17 LAB — CERVICOVAGINAL ANCILLARY ONLY
Chlamydia: NEGATIVE
Neisseria Gonorrhea: NEGATIVE

## 2019-08-17 LAB — CULTURE, OB URINE

## 2019-08-17 LAB — URINE CULTURE, OB REFLEX

## 2019-08-20 LAB — CYTOLOGY - PAP
Diagnosis: NEGATIVE
High risk HPV: NEGATIVE
Molecular Disclaimer: 56
Molecular Disclaimer: DETECTED
Molecular Disclaimer: NORMAL

## 2019-08-22 ENCOUNTER — Encounter: Payer: Self-pay | Admitting: Obstetrics and Gynecology

## 2019-08-30 ENCOUNTER — Encounter: Payer: Self-pay | Admitting: Obstetrics and Gynecology

## 2019-09-10 ENCOUNTER — Encounter: Payer: Self-pay | Admitting: Obstetrics and Gynecology

## 2019-09-10 ENCOUNTER — Ambulatory Visit (INDEPENDENT_AMBULATORY_CARE_PROVIDER_SITE_OTHER): Payer: Medicaid Other | Admitting: Obstetrics and Gynecology

## 2019-09-10 ENCOUNTER — Other Ambulatory Visit: Payer: Self-pay

## 2019-09-10 VITALS — BP 119/83 | HR 88 | Wt 187.0 lb

## 2019-09-10 DIAGNOSIS — O09899 Supervision of other high risk pregnancies, unspecified trimester: Secondary | ICD-10-CM

## 2019-09-10 DIAGNOSIS — O09892 Supervision of other high risk pregnancies, second trimester: Secondary | ICD-10-CM

## 2019-09-10 DIAGNOSIS — Z3A18 18 weeks gestation of pregnancy: Secondary | ICD-10-CM

## 2019-09-10 DIAGNOSIS — Z8751 Personal history of pre-term labor: Secondary | ICD-10-CM

## 2019-09-10 DIAGNOSIS — O09292 Supervision of pregnancy with other poor reproductive or obstetric history, second trimester: Secondary | ICD-10-CM

## 2019-09-10 DIAGNOSIS — R768 Other specified abnormal immunological findings in serum: Secondary | ICD-10-CM

## 2019-09-10 NOTE — Progress Notes (Signed)
   PRENATAL VISIT NOTE  Subjective:  Adriana [name] is a 23 y.o. G3P1101 at [redacted]w[redacted]d being seen today for ongoing prenatal care.  She is currently monitored for the following issues for this high-risk pregnancy and has Migraine headache with aura; Prior pregnancy with fetal demise and current pregnancy; HSV-2 seropositive; Supervision of other high risk pregnancy, antepartum; and History of preterm delivery on their problem list.  Patient reports headache.  Contractions: Not present. Vag. Bleeding: None.  Movement: Present. Denies leaking of fluid.   The following portions of the patient's history were reviewed and updated as appropriate: allergies, current medications, past family history, past medical history, past social history, past surgical history and problem list.   Objective:   Vitals:   09/10/19 0836  BP: 119/83  Pulse: 88  Weight: 187 lb (84.8 kg)    Fetal Status: Fetal Heart Rate (bpm): 143   Movement: Present     General:  Alert, oriented and cooperative. Patient is in no acute distress.  Skin: Skin is warm and dry. No rash noted.   Cardiovascular: Normal heart rate noted  Respiratory: Normal respiratory effort, no problems with respiration noted  Abdomen: Soft, gravid, appropriate for gestational age.  Pain/Pressure: Present     Pelvic: Cervical exam deferred        Extremities: Normal range of motion.  Edema: None  Mental Status: Normal mood and affect. Normal behavior. Normal judgment and thought content.   Assessment and Plan:  Pregnancy: G3P1101 at [redacted]w[redacted]d 1. Supervision of other high risk pregnancy, antepartum Patient is doing well reporting daily headache which she treats occasionally with tylenol as she does not like taking medication Encouraged patient to increase her hydration and to take tylenol as needed. If headaches persists may need to refer AFP today Anatomy ultrasound ordered - AFP, Serum, Open Spina Bifida - [place] MFM OB DETAIL +14 WK; Future  2. History  of preterm delivery Patient declined weekly 17-P  3. Prior pregnancy with fetal demise and current pregnancy in second trimester   4. HSV-2 seropositive Prophylaxis at 36 weeks  Preterm labor symptoms and general obstetric precautions including but not limited to vaginal bleeding, contractions, leaking of fluid and fetal movement were reviewed in detail with the patient. Please refer to After Visit Summary for other counseling recommendations.   Return in about 4 weeks (around 10/08/2019) for Virtual, ROB, High risk.  No future appointments.  Mora Bellman, MD

## 2019-09-10 NOTE — Progress Notes (Signed)
ROB   CC: Constant Headaches will take Tylenol for relief.  Has not picked up aspirin.

## 2019-09-12 LAB — AFP, SERUM, OPEN SPINA BIFIDA
AFP MoM: 1.2
AFP Value: 52.8 ng/mL
Gest. Age on Collection Date: 18.4 weeks
Maternal Age At EDD: 22.2 yr
OSBR Risk 1 IN: 10000
Test Results:: NEGATIVE
Weight: 187 [lb_av]

## 2019-09-16 ENCOUNTER — Encounter: Payer: Self-pay | Admitting: Family Medicine

## 2019-09-16 DIAGNOSIS — O0932 Supervision of pregnancy with insufficient antenatal care, second trimester: Secondary | ICD-10-CM | POA: Insufficient documentation

## 2019-09-24 ENCOUNTER — Other Ambulatory Visit: Payer: Self-pay

## 2019-09-24 ENCOUNTER — Ambulatory Visit (HOSPITAL_COMMUNITY)
Admission: RE | Admit: 2019-09-24 | Discharge: 2019-09-24 | Disposition: A | Discharge status: Home / Self Care | Payer: Medicaid Other | Source: Ambulatory Visit | Attending: Obstetrics and Gynecology | Admitting: Obstetrics and Gynecology

## 2019-09-24 ENCOUNTER — Ambulatory Visit (HOSPITAL_COMMUNITY): Payer: Medicaid Other | Admitting: *Deleted

## 2019-09-24 ENCOUNTER — Other Ambulatory Visit (HOSPITAL_COMMUNITY): Payer: Self-pay | Admitting: *Deleted

## 2019-09-24 ENCOUNTER — Encounter (HOSPITAL_COMMUNITY): Payer: Self-pay

## 2019-09-24 VITALS — BP 116/64 | HR 80 | Temp 98.7°F

## 2019-09-24 DIAGNOSIS — O09899 Supervision of other high risk pregnancies, unspecified trimester: Secondary | ICD-10-CM

## 2019-09-24 DIAGNOSIS — O09212 Supervision of pregnancy with history of pre-term labor, second trimester: Secondary | ICD-10-CM

## 2019-09-24 DIAGNOSIS — O09292 Supervision of pregnancy with other poor reproductive or obstetric history, second trimester: Secondary | ICD-10-CM | POA: Insufficient documentation

## 2019-09-24 DIAGNOSIS — Z3A20 20 weeks gestation of pregnancy: Secondary | ICD-10-CM

## 2019-09-24 DIAGNOSIS — O0932 Supervision of pregnancy with insufficient antenatal care, second trimester: Secondary | ICD-10-CM | POA: Insufficient documentation

## 2019-09-24 DIAGNOSIS — Z8759 Personal history of other complications of pregnancy, childbirth and the puerperium: Secondary | ICD-10-CM

## 2019-10-08 ENCOUNTER — Telehealth: Payer: Medicaid Other | Admitting: Obstetrics and Gynecology

## 2019-10-15 ENCOUNTER — Telehealth: Payer: Self-pay | Admitting: Obstetrics and Gynecology

## 2019-10-22 ENCOUNTER — Other Ambulatory Visit: Payer: Self-pay

## 2019-10-22 ENCOUNTER — Ambulatory Visit (HOSPITAL_COMMUNITY)
Admission: RE | Admit: 2019-10-22 | Discharge: 2019-10-22 | Disposition: A | Discharge status: Home / Self Care | Payer: Medicaid Other | Source: Ambulatory Visit | Attending: Obstetrics and Gynecology | Admitting: Obstetrics and Gynecology

## 2019-10-22 ENCOUNTER — Encounter (HOSPITAL_COMMUNITY): Payer: Self-pay

## 2019-10-22 ENCOUNTER — Other Ambulatory Visit (HOSPITAL_COMMUNITY): Payer: Self-pay | Admitting: *Deleted

## 2019-10-22 ENCOUNTER — Ambulatory Visit (HOSPITAL_COMMUNITY): Payer: Medicaid Other | Admitting: *Deleted

## 2019-10-22 DIAGNOSIS — O09899 Supervision of other high risk pregnancies, unspecified trimester: Secondary | ICD-10-CM | POA: Insufficient documentation

## 2019-10-22 DIAGNOSIS — O0932 Supervision of pregnancy with insufficient antenatal care, second trimester: Secondary | ICD-10-CM | POA: Insufficient documentation

## 2019-10-22 DIAGNOSIS — Z362 Encounter for other antenatal screening follow-up: Secondary | ICD-10-CM

## 2019-10-22 DIAGNOSIS — O09212 Supervision of pregnancy with history of pre-term labor, second trimester: Secondary | ICD-10-CM

## 2019-10-22 DIAGNOSIS — O09292 Supervision of pregnancy with other poor reproductive or obstetric history, second trimester: Secondary | ICD-10-CM

## 2019-10-22 DIAGNOSIS — Z8759 Personal history of other complications of pregnancy, childbirth and the puerperium: Secondary | ICD-10-CM | POA: Insufficient documentation

## 2019-10-22 DIAGNOSIS — Z3A24 24 weeks gestation of pregnancy: Secondary | ICD-10-CM | POA: Diagnosis not present

## 2019-10-23 ENCOUNTER — Telehealth: Payer: Medicaid Other

## 2019-11-18 ENCOUNTER — Other Ambulatory Visit: Payer: Medicaid Other

## 2019-11-18 ENCOUNTER — Other Ambulatory Visit: Payer: Self-pay

## 2019-11-18 ENCOUNTER — Ambulatory Visit (INDEPENDENT_AMBULATORY_CARE_PROVIDER_SITE_OTHER): Payer: Medicaid Other | Admitting: Advanced Practice Midwife

## 2019-11-18 VITALS — BP 122/82 | HR 103 | Wt 197.0 lb

## 2019-11-18 DIAGNOSIS — O09893 Supervision of other high risk pregnancies, third trimester: Secondary | ICD-10-CM

## 2019-11-18 DIAGNOSIS — O99891 Other specified diseases and conditions complicating pregnancy: Secondary | ICD-10-CM

## 2019-11-18 DIAGNOSIS — M549 Dorsalgia, unspecified: Secondary | ICD-10-CM

## 2019-11-18 DIAGNOSIS — K5900 Constipation, unspecified: Secondary | ICD-10-CM

## 2019-11-18 DIAGNOSIS — O364XX0 Maternal care for intrauterine death, not applicable or unspecified: Secondary | ICD-10-CM

## 2019-11-18 DIAGNOSIS — O99613 Diseases of the digestive system complicating pregnancy, third trimester: Secondary | ICD-10-CM

## 2019-11-18 DIAGNOSIS — O09899 Supervision of other high risk pregnancies, unspecified trimester: Secondary | ICD-10-CM

## 2019-11-18 DIAGNOSIS — Z3A28 28 weeks gestation of pregnancy: Secondary | ICD-10-CM

## 2019-11-18 MED ORDER — COMFORT FIT MATERNITY SUPP MED MISC: 1.0000 | Freq: Every day | 0 refills | Status: DC

## 2019-11-18 MED ORDER — DOCUSATE SODIUM 100 MG PO CAPS: 100.0000 mg | ORAL_CAPSULE | Freq: Two times a day (BID) | ORAL | 2 refills | Status: DC | PRN

## 2019-11-18 MED ORDER — POLYETHYLENE GLYCOL 3350 17 GM/SCOOP PO POWD: 17.0000 g | Freq: Every day | ORAL | 0 refills | Status: DC

## 2019-11-18 NOTE — Addendum Note (Signed)
Addended byCleotilde Neer on: 11/18/2019 09:41 AM   Modules accepted: Orders

## 2019-11-18 NOTE — Progress Notes (Signed)
   PRENATAL VISIT NOTE  Subjective:  Adriana [name] is a 23 y.o. G3P1101 at [redacted]w[redacted]d being seen today for ongoing prenatal care.  She is currently monitored for the following issues for this high-risk pregnancy and has Migraine headache with aura; Prior pregnancy with fetal demise and current pregnancy; HSV-2 seropositive; Supervision of other high risk pregnancy, antepartum; History of preterm delivery; and Late prenatal care affecting pregnancy in second trimester on their problem list.  Patient reports backache.  Contractions: Irritability. Vag. Bleeding: None.  Movement: Present. Denies leaking of fluid.   The following portions of the patient's history were reviewed and updated as appropriate: allergies, current medications, past family history, past medical history, past social history, past surgical history and problem list.   Objective:   Vitals:   11/18/19 0839  BP: 122/82  Pulse: (!) 103  Weight: 197 lb (89.4 kg)    Fetal Status: Fetal Heart Rate (bpm): 135 Fundal Height: 27 cm Movement: Present     General:  Alert, oriented and cooperative. Patient is in no acute distress.  Skin: Skin is warm and dry. No rash noted.   Cardiovascular: Normal heart rate noted  Respiratory: Normal respiratory effort, no problems with respiration noted  Abdomen: Soft, gravid, appropriate for gestational age.  Pain/Pressure: Present     Pelvic: Cervical exam deferred        Extremities: Normal range of motion.  Edema: None  Mental Status: Normal mood and affect. Normal behavior. Normal judgment and thought content.   Assessment and Plan:  Pregnancy: G3P1101 at [redacted]w[redacted]d 1. Supervision of other high risk pregnancy, antepartum --Anticipatory guidance about next visits/weeks of pregnancy given. --Next visit in 2 weeks in office, pt prefers to hear heart tones each visit  2. IUFD at 61 weeks or more of gestation --Doing well, f/u ultrasound tomorrow, plan for IOL at 39 weeks  3. Back pain affecting  pregnancy in third trimester --Constant x several hours, then resolves.   --Likely musculoskeletal, but will evaluate urine culture. Neg CVA tenderness in office today, no urinary symptoms. --Rest/ice/heat/warm bath/Tylenol/pregnancy support belt - Urine Culture-OB  Preterm labor symptoms and general obstetric precautions including but not limited to vaginal bleeding, contractions, leaking of fluid and fetal movement were reviewed in detail with the patient. Please refer to After Visit Summary for other counseling recommendations.   Return in about 2 weeks (around 12/02/2019).  Future Appointments  Date Time Provider [place]  11/19/2019  8:15 AM [place] [facility]-US  11/19/2019  8:15 AM [facility]-MFC [place] 4 WH-MFCUS MFC-US  12/16/2019  9:30 AM Constant, Vickii Chafe, MD [place] None    Fatima Blank, CNM

## 2019-11-18 NOTE — Patient Instructions (Signed)

## 2019-11-19 ENCOUNTER — Encounter (HOSPITAL_COMMUNITY): Payer: Self-pay

## 2019-11-19 ENCOUNTER — Telehealth: Payer: Self-pay | Admitting: Advanced Practice Midwife

## 2019-11-19 ENCOUNTER — Ambulatory Visit (HOSPITAL_COMMUNITY)
Admission: RE | Admit: 2019-11-19 | Discharge: 2019-11-19 | Disposition: A | Discharge status: Home / Self Care | Payer: Medicaid Other | Source: Ambulatory Visit | Attending: Obstetrics and Gynecology | Admitting: Obstetrics and Gynecology

## 2019-11-19 ENCOUNTER — Ambulatory Visit (HOSPITAL_COMMUNITY): Payer: Medicaid Other | Admitting: *Deleted

## 2019-11-19 ENCOUNTER — Other Ambulatory Visit (HOSPITAL_COMMUNITY): Payer: Self-pay | Admitting: *Deleted

## 2019-11-19 DIAGNOSIS — O09213 Supervision of pregnancy with history of pre-term labor, third trimester: Secondary | ICD-10-CM | POA: Diagnosis not present

## 2019-11-19 DIAGNOSIS — O99013 Anemia complicating pregnancy, third trimester: Secondary | ICD-10-CM

## 2019-11-19 DIAGNOSIS — Z362 Encounter for other antenatal screening follow-up: Secondary | ICD-10-CM

## 2019-11-19 DIAGNOSIS — Z8759 Personal history of other complications of pregnancy, childbirth and the puerperium: Secondary | ICD-10-CM | POA: Diagnosis present

## 2019-11-19 DIAGNOSIS — Z3A28 28 weeks gestation of pregnancy: Secondary | ICD-10-CM

## 2019-11-19 DIAGNOSIS — O09293 Supervision of pregnancy with other poor reproductive or obstetric history, third trimester: Secondary | ICD-10-CM

## 2019-11-19 DIAGNOSIS — O09899 Supervision of other high risk pregnancies, unspecified trimester: Secondary | ICD-10-CM | POA: Diagnosis present

## 2019-11-19 DIAGNOSIS — O0932 Supervision of pregnancy with insufficient antenatal care, second trimester: Secondary | ICD-10-CM | POA: Diagnosis present

## 2019-11-19 LAB — CBC
Hematocrit: 26.5 % — ABNORMAL LOW (ref 34.0–46.6)
Hemoglobin: 9 g/dL — ABNORMAL LOW (ref 11.1–15.9)
MCH: 32.7 pg (ref 26.6–33.0)
MCHC: 34 g/dL (ref 31.5–35.7)
MCV: 96 fL (ref 79–97)
Platelets: 281 10*3/uL (ref 150–450)
RBC: 2.75 x10E6/uL — ABNORMAL LOW (ref 3.77–5.28)
RDW: 12.5 % (ref 11.7–15.4)
WBC: 5.3 10*3/uL (ref 3.4–10.8)

## 2019-11-19 LAB — RPR: RPR Ser Ql: NONREACTIVE

## 2019-11-19 LAB — HIV ANTIBODY (ROUTINE TESTING W REFLEX): HIV Screen 4th Generation wRfx: NONREACTIVE

## 2019-11-19 LAB — GLUCOSE TOLERANCE, 2 HOURS W/ 1HR
Glucose, 1 hour: 120 mg/dL (ref 65–179)
Glucose, 2 hour: 125 mg/dL (ref 65–152)
Glucose, Fasting: 86 mg/dL (ref 65–91)

## 2019-11-19 MED ORDER — INTEGRA 62.5-62.5-40-3 MG PO CAPS: 1.0000 | ORAL_CAPSULE | Freq: Every day | ORAL | 2 refills | Status: DC

## 2019-11-19 NOTE — Telephone Encounter (Signed)
Pt with Hgb 9.0 at 28 week lab visit.  Called pt to notify but received a busy signal so was unable to leave a message. Rx for Integra, Rx iron was sent to her pharmacy on 11/19/19.  I recommend pt continue PNV and daily iron for remainder of pregnancy.

## 2019-11-21 LAB — CULTURE, OB URINE

## 2019-11-21 LAB — URINE CULTURE, OB REFLEX

## 2019-11-29 NOTE — L&D Delivery Note (Signed)
OB/GYN Faculty Practice Delivery Note  Adriana [name] is a 24 y.o. G3P1101 s/p VD at [redacted]w[redacted]d. She was admitted for IOL for DFM and BPP 6/8.   ROM: 3h 77m with clear fluid GBS Status: Negative/-- (03/03 1355) Maximum Maternal Temperature: 98.46F  Labor Progress: . Initial SVE: 1/50/-3. Patient received Cytotec. SROM occurred. AROM of fore-bag. Received epidural and Pitocin. She then progressed to complete.   Delivery Date/Time: 2/18 @ 1431 Delivery: Called to room and patient was complete and pushing. Head delivered in LOA position. No nuchal cord present. Shoulder and body delivered in usual fashion. Infant with spontaneous cry, placed on mother's abdomen, dried and stimulated. Cord clamped x 2 after 1-minute delay, and cut by [name]. Cord blood drawn. Placenta delivered spontaneously with gentle cord traction. Fundus firm with massage and Pitocin. Labia, perineum, vagina, and cervix inspected inspected with no lacerations.  Baby Weight: pending  Placenta: Sent to L&D Complications: None Lacerations: None EBL: 116 mL Analgesia: Epidural   Infant: APGAR (1 MIN): 9   APGAR (5 MINS): 9   APGAR (10 MINS):     Jerilynn Birkenhead, MD [facility] Family Medicine Fellow, [facility] for [facility], [facility] 01/16/2020, 3:03 PM

## 2019-12-02 ENCOUNTER — Other Ambulatory Visit: Payer: Self-pay

## 2019-12-02 ENCOUNTER — Encounter: Payer: Self-pay | Admitting: Obstetrics and Gynecology

## 2019-12-02 ENCOUNTER — Ambulatory Visit (INDEPENDENT_AMBULATORY_CARE_PROVIDER_SITE_OTHER): Payer: Medicaid Other | Admitting: Obstetrics and Gynecology

## 2019-12-02 VITALS — BP 101/68 | HR 90 | Wt 198.0 lb

## 2019-12-02 DIAGNOSIS — O09899 Supervision of other high risk pregnancies, unspecified trimester: Secondary | ICD-10-CM

## 2019-12-02 DIAGNOSIS — Z3A30 30 weeks gestation of pregnancy: Secondary | ICD-10-CM

## 2019-12-02 DIAGNOSIS — R768 Other specified abnormal immunological findings in serum: Secondary | ICD-10-CM

## 2019-12-02 DIAGNOSIS — O09893 Supervision of other high risk pregnancies, third trimester: Secondary | ICD-10-CM

## 2019-12-02 DIAGNOSIS — O09293 Supervision of pregnancy with other poor reproductive or obstetric history, third trimester: Secondary | ICD-10-CM

## 2019-12-02 NOTE — Progress Notes (Signed)
   PRENATAL VISIT NOTE  Subjective:  Adriana [name] is a 24 y.o. G3P1101 at [redacted]w[redacted]d being seen today for ongoing prenatal care.  She is currently monitored for the following issues for this high-risk pregnancy and has Migraine headache with aura; Prior pregnancy with fetal demise and current pregnancy; HSV-2 seropositive; Supervision of other high risk pregnancy, antepartum; History of preterm delivery; and Late prenatal care affecting pregnancy in second trimester on their problem list.  Patient reports braxton hicks contractions.  Contractions: Irritability. Vag. Bleeding: None.  Movement: Present. Denies leaking of fluid.   The following portions of the patient's history were reviewed and updated as appropriate: allergies, current medications, past family history, past medical history, past social history, past surgical history and problem list.   Objective:   Vitals:   12/02/19 1106  BP: 101/68  Pulse: 90  Weight: 198 lb (89.8 kg)    Fetal Status: Fetal Heart Rate (bpm): 144   Movement: Present     General:  Alert, oriented and cooperative. Patient is in no acute distress.  Skin: Skin is warm and dry. No rash noted.   Cardiovascular: Normal heart rate noted  Respiratory: Normal respiratory effort, no problems with respiration noted  Abdomen: Soft, gravid, appropriate for gestational age.  Pain/Pressure: Present     Pelvic: Cervical exam deferred        Extremities: Normal range of motion.  Edema: Trace  Mental Status: Normal mood and affect. Normal behavior. Normal judgment and thought content.   Assessment and Plan:  Pregnancy: G3P1101 at [redacted]w[redacted]d 1. Supervision of other high risk pregnancy, antepartum Patient is doing well without complaints Patient undecided on contraception at this time but is considering her options  2. Prior pregnancy with fetal demise and current pregnancy in third trimester Will start antenatal testing at next appointment  3. HSV-2  seropositive Prophylaxis at 34-36 weeks  Preterm labor symptoms and general obstetric precautions including but not limited to vaginal bleeding, contractions, leaking of fluid and fetal movement were reviewed in detail with the patient. Please refer to After Visit Summary for other counseling recommendations.   Return in about 2 weeks (around 12/16/2019) for in person, ROB, High risk, NST.  Future Appointments  Date Time Provider Department Center  12/17/2019  8:15 AM WH-MFC NURSE WH-MFC MFC-US  12/17/2019  8:15 AM WH-MFC [place] 4 WH-MFCUS MFC-US  12/24/2019  8:15 AM WH-MFC NURSE WH-MFC MFC-US  12/24/2019  8:15 AM WH-MFC [place] 4 WH-MFCUS MFC-US  12/31/2019  8:15 AM WH-MFC NURSE WH-MFC MFC-US  12/31/2019  8:15 AM WH-MFC [place] 4 WH-MFCUS MFC-US    Catalina Antigua, MD

## 2019-12-02 NOTE — Progress Notes (Signed)
ROB. C/o pressure. 

## 2019-12-11 ENCOUNTER — Encounter (HOSPITAL_COMMUNITY): Payer: Self-pay | Admitting: Obstetrics & Gynecology

## 2019-12-11 ENCOUNTER — Inpatient Hospital Stay (HOSPITAL_COMMUNITY)
Admission: AD | Admit: 2019-12-11 | Discharge: 2019-12-11 | Disposition: A | Discharge status: Home / Self Care | Payer: Medicaid Other | Attending: Obstetrics & Gynecology | Admitting: Obstetrics & Gynecology

## 2019-12-11 ENCOUNTER — Other Ambulatory Visit: Payer: Self-pay

## 2019-12-11 DIAGNOSIS — O09899 Supervision of other high risk pregnancies, unspecified trimester: Secondary | ICD-10-CM

## 2019-12-11 DIAGNOSIS — O0933 Supervision of pregnancy with insufficient antenatal care, third trimester: Secondary | ICD-10-CM | POA: Insufficient documentation

## 2019-12-11 DIAGNOSIS — Z87891 Personal history of nicotine dependence: Secondary | ICD-10-CM | POA: Diagnosis not present

## 2019-12-11 DIAGNOSIS — Z3A31 31 weeks gestation of pregnancy: Secondary | ICD-10-CM

## 2019-12-11 DIAGNOSIS — O368130 Decreased fetal movements, third trimester, not applicable or unspecified: Secondary | ICD-10-CM | POA: Insufficient documentation

## 2019-12-11 DIAGNOSIS — G43909 Migraine, unspecified, not intractable, without status migrainosus: Secondary | ICD-10-CM

## 2019-12-11 DIAGNOSIS — Z7982 Long term (current) use of aspirin: Secondary | ICD-10-CM | POA: Insufficient documentation

## 2019-12-11 DIAGNOSIS — O26893 Other specified pregnancy related conditions, third trimester: Secondary | ICD-10-CM | POA: Diagnosis not present

## 2019-12-11 DIAGNOSIS — R519 Headache, unspecified: Secondary | ICD-10-CM

## 2019-12-11 DIAGNOSIS — O0932 Supervision of pregnancy with insufficient antenatal care, second trimester: Secondary | ICD-10-CM

## 2019-12-11 DIAGNOSIS — O4703 False labor before 37 completed weeks of gestation, third trimester: Secondary | ICD-10-CM | POA: Diagnosis present

## 2019-12-11 LAB — URINALYSIS, ROUTINE W REFLEX MICROSCOPIC
Bilirubin Urine: NEGATIVE
Glucose, UA: NEGATIVE mg/dL
Hgb urine dipstick: NEGATIVE
Ketones, ur: NEGATIVE mg/dL
Leukocytes,Ua: NEGATIVE
Nitrite: NEGATIVE
Protein, ur: NEGATIVE mg/dL
Specific Gravity, Urine: 1.005 (ref 1.005–1.030)
pH: 7 (ref 5.0–8.0)

## 2019-12-11 LAB — CBC
HCT: 29.8 % — ABNORMAL LOW (ref 36.0–46.0)
Hemoglobin: 9.6 g/dL — ABNORMAL LOW (ref 12.0–15.0)
MCH: 32 pg (ref 26.0–34.0)
MCHC: 32.2 g/dL (ref 30.0–36.0)
MCV: 99.3 fL (ref 80.0–100.0)
Platelets: 278 10*3/uL (ref 150–400)
RBC: 3 MIL/uL — ABNORMAL LOW (ref 3.87–5.11)
RDW: 14.7 % (ref 11.5–15.5)
WBC: 6.6 10*3/uL (ref 4.0–10.5)
nRBC: 0 % (ref 0.0–0.2)

## 2019-12-11 MED ORDER — LACTATED RINGERS IV BOLUS
1000.0000 mL | Freq: Once | INTRAVENOUS | Status: AC
  Administered 2019-12-11: 1000 mL via INTRAVENOUS

## 2019-12-11 MED ORDER — DEXAMETHASONE SODIUM PHOSPHATE 10 MG/ML IJ SOLN
10.0000 mg | Freq: Once | INTRAMUSCULAR | Status: AC
  Administered 2019-12-11: 10 mg via INTRAVENOUS
  Filled 2019-12-11: qty 1

## 2019-12-11 MED ORDER — METOCLOPRAMIDE HCL 10 MG PO TABS: 10.0000 mg | ORAL_TABLET | Freq: Four times a day (QID) | ORAL | 0 refills | Status: DC

## 2019-12-11 MED ORDER — METOCLOPRAMIDE HCL 5 MG/ML IJ SOLN
10.0000 mg | Freq: Once | INTRAMUSCULAR | Status: AC
  Administered 2019-12-11: 10 mg via INTRAVENOUS
  Filled 2019-12-11: qty 2

## 2019-12-11 NOTE — MAU Note (Addendum)
Having ctxs for past few wks but stronger today. Headache for 4 days and tylenol not helping. Sometimes sees "sparkle of lights". Does feel baby move but seems alittle less than normal. Has hx migraines and this feels like a migraine.

## 2019-12-11 NOTE — MAU Provider Note (Addendum)
History     CSN: [id number]  Arrival date and time: 12/11/19 2001   First Provider Initiated Contact with Patient 12/11/19 2125      Chief Complaint  Patient presents with  . Contractions  . Headache  . Decreased Fetal Movement   Ms. Courtenay Hirth is a 24 y.o. G3P1101 at 100w5dwho presents to MAU for HA and Braxton-Hicks contractions. Pt denies any contractions at this time. Pt reports a hx of migraine HA and reports this feels the same.  Onset: 4days Location: left temple, radiates towards the back of head Duration: 4days Character: constant, throbbing, not worsening Aggravating/Associated: light, sound/nausea "every once in a while" without vomiting Relieving: none Treatment: Tylenol Extra Strength x2 - did not work, last took last night, water - did not help, resting - did not help Severity: 6/10  Pt denies VB, LOF, ctx, decreased FM, vaginal discharge/odor/itching. Pt denies vomiting, abdominal pain, constipation, diarrhea, or urinary problems. Pt denies fever, chills, fatigue, sweating or changes in appetite. Pt denies SOB or chest pain. Pt denies dizziness, HA, light-headedness, weakness.  Problems this pregnancy include: hx of IUFD, anemia. Allergies? Imitrex, latex Current medications/supplements? Iron (Integra), PNVs Prenatal care provider? [facility], next appt Monday 12/16/2019   OB History    Gravida  3   Para  2   Term  1   Preterm  1   AB      Living  1     SAB      TAB      Ectopic      Multiple  0   Live Births  1           Past Medical History:  Diagnosis Date  . Migraines     Past Surgical History:  Procedure Laterality Date  . HERNIA REPAIR     as an infant    History reviewed. No pertinent family history.  Social History   Tobacco Use  . Smoking status: Former Smoker    Quit date: 06/30/2016    Years since quitting: 3.4  . Smokeless tobacco: Never Used  Substance Use Topics  . Alcohol use: No    Alcohol/week: 0.0  standard drinks  . Drug use: No    Allergies:  Allergies  Allergen Reactions  . Imitrex [Sumatriptan] Other (See Comments)    Chest pain, heaviness, migraine  . Latex Rash    Medications Prior to Admission  Medication Sig Dispense Refill Last Dose  . Fe Fum-FePoly-Vit C-Vit B3 (INTEGRA) 62.5-62.5-40-3 MG CAPS Take 1 capsule by mouth daily. 30 capsule 2 12/11/2019 at Unknown time  . Prenatal MV-Min-FA-Omega-3 (PRENATAL GUMMIES/DHA & FA) 0.4-32.5 MG CHEW Chew by mouth.   12/11/2019 at Unknown time  . aspirin EC 81 MG tablet Take 1 tablet (81 mg total) by mouth daily. (Patient not taking: Reported on 09/10/2019) 30 tablet 11 Unknown at Unknown time  . Blood Pressure Monitoring (BLOOD PRESSURE KIT) DEVI 1 kit by Does not apply route once a week. Check BP Weekly.  Large Cuff.  Dx:Z13.6  Z34.86  O09.0 1 kit 0 Unknown at Unknown time  . docusate sodium (COLACE) 100 MG capsule Take 1 capsule (100 mg total) by mouth 2 (two) times daily as needed. (Patient not taking: Reported on 11/19/2019) 30 capsule 2 Unknown at Unknown time  . Elastic Bandages & Supports (COMFORT FIT MATERNITY SUPP MED) MISC 1 Device by Does not apply route daily. 1 each 0 Unknown at Unknown time  . famotidine (PEPCID) 20 MG  tablet Take 1 tablet (20 mg total) by mouth daily. 30 tablet 0   . polyethylene glycol powder (GLYCOLAX/MIRALAX) 17 GM/SCOOP powder Take 17 g by mouth daily. (Patient not taking: Reported on 11/19/2019) 255 g 0 Unknown at Unknown time  . promethazine (PHENERGAN) 25 MG tablet TAKE 1 TABLET(25 MG) BY MOUTH EVERY 6 HOURS AS NEEDED FOR NAUSEA OR VOMITING (Patient not taking: Reported on 09/10/2019) 30 tablet 1 Unknown at Unknown time    Review of Systems  Constitutional: Negative for chills, diaphoresis, fatigue and fever.  Eyes: Negative for visual disturbance.  Respiratory: Negative for shortness of breath.   Cardiovascular: Negative for chest pain.  Gastrointestinal: Negative for abdominal pain,  constipation, diarrhea, nausea and vomiting.  Genitourinary: Negative for dysuria, flank pain, frequency, pelvic pain, urgency, vaginal bleeding and vaginal discharge.  Neurological: Positive for headaches. Negative for dizziness, weakness and light-headedness.   Physical Exam   Blood pressure 111/63, pulse 97, temperature 98.4 F (36.9 C), resp. rate 18, last menstrual period 05/03/2019, currently breastfeeding.  Patient Vitals for the past 24 hrs:  BP Temp Pulse Resp  12/11/19 2026 111/63 -- 97 --  12/11/19 2021 -- 98.4 F (36.9 C) -- 18   Physical Exam  Constitutional: She is oriented to person, place, and time. She appears well-developed and well-nourished. No distress.  HENT:  Head: Normocephalic and atraumatic.  Respiratory: Effort normal.  Neurological: She is alert and oriented to person, place, and time.  Skin: She is not diaphoretic.  Psychiatric: She has a normal mood and affect. Her behavior is normal. Judgment and thought content normal.   Results for orders placed or performed during the hospital encounter of 12/11/19 (from the past 24 hour(s))  Urinalysis, Routine w reflex microscopic     Status: Abnormal   Collection Time: 12/11/19  8:30 PM  Result Value Ref Range   Color, Urine STRAW (A) YELLOW   APPearance CLEAR CLEAR   Specific Gravity, Urine 1.005 1.005 - 1.030   pH 7.0 5.0 - 8.0   Glucose, UA NEGATIVE NEGATIVE mg/dL   Hgb urine dipstick NEGATIVE NEGATIVE   Bilirubin Urine NEGATIVE NEGATIVE   Ketones, ur NEGATIVE NEGATIVE mg/dL   Protein, ur NEGATIVE NEGATIVE mg/dL   Nitrite NEGATIVE NEGATIVE   Leukocytes,Ua NEGATIVE NEGATIVE   MAU Course  Procedures  MDM -pt with hx of migraines with 4day HA that she reports feels like her typical migraine -UA: straw, otherwise WNL -CBC ordered d/t anemia, results pending at time of care transfer -1L LR + 79m Reglan + 122mDecadron given; pt does not have a ride home and is unable to take Benadryl; pt instructed to  take Benadryl when she gets home, if still needed for HA -care transferred to [facility] [place]'@953PM'  Nugent, NiGerrie NordmannNP  9:53 PM 12/11/2019  Orders Placed This Encounter  Procedures  . Urinalysis, Routine w reflex microscopic    Standing Status:   Standing    Number of Occurrences:   1  . CBC    Standing Status:   Standing    Number of Occurrences:   1  . Insert peripheral IV    Standing Status:   Standing    Number of Occurrences:   1   Meds ordered this encounter  Medications  . lactated ringers bolus 1,000 mL  . metoCLOPramide (REGLAN) injection 10 mg  . dexamethasone (DECADRON) injection 10 mg   Assessment and Plan   MDM: Pt h/a reduced significantly but not resolved with h/a cocktail.  Pt driving so unable to give Benadryl.  Pt to go home and take Benadryl PO.  CBC shows anemia but hgb increased from 9.0 to 9.6 since starting Integra PO supplement.  BP wnl today, no evidence of PEC.  Neuro exam wnl.  Precautions/reasons to return reviewed.   A: 1. Headache in pregnancy, antepartum, third trimester   2. Late prenatal care affecting pregnancy in second trimester   3. Supervision of other high risk pregnancy, antepartum   4. Migraine without status migrainosus, not intractable, unspecified migraine type     P: D/C home Rx for Reglan, pt to take Tylenol and Reglan at onset of migraines Increase PO fluids Continue oral iron supplement, increase iron rich foods Keep scheduled appt with [facility] Return to MAU as needed for emergencies.  Fatima Blank, CNM 11:29 PM

## 2019-12-16 ENCOUNTER — Encounter: Payer: Self-pay | Admitting: Obstetrics and Gynecology

## 2019-12-16 ENCOUNTER — Encounter: Payer: Medicaid Other | Admitting: Obstetrics and Gynecology

## 2019-12-16 ENCOUNTER — Other Ambulatory Visit: Payer: Self-pay

## 2019-12-16 ENCOUNTER — Ambulatory Visit (INDEPENDENT_AMBULATORY_CARE_PROVIDER_SITE_OTHER): Payer: Medicaid Other | Admitting: Obstetrics and Gynecology

## 2019-12-16 VITALS — BP 108/71 | HR 85 | Wt 201.3 lb

## 2019-12-16 DIAGNOSIS — O09899 Supervision of other high risk pregnancies, unspecified trimester: Secondary | ICD-10-CM

## 2019-12-16 DIAGNOSIS — Z3A32 32 weeks gestation of pregnancy: Secondary | ICD-10-CM | POA: Diagnosis not present

## 2019-12-16 DIAGNOSIS — O09893 Supervision of other high risk pregnancies, third trimester: Secondary | ICD-10-CM

## 2019-12-16 DIAGNOSIS — Z8751 Personal history of pre-term labor: Secondary | ICD-10-CM

## 2019-12-16 DIAGNOSIS — O09293 Supervision of pregnancy with other poor reproductive or obstetric history, third trimester: Secondary | ICD-10-CM | POA: Diagnosis not present

## 2019-12-16 DIAGNOSIS — R768 Other specified abnormal immunological findings in serum: Secondary | ICD-10-CM

## 2019-12-16 NOTE — Progress Notes (Signed)
   PRENATAL VISIT NOTE  Subjective:  Adriana [name] is a 24 y.o. G3P1101 at [redacted]w[redacted]d being seen today for ongoing prenatal care.  She is currently monitored for the following issues for this high-risk pregnancy and has Migraine headache with aura; Prior pregnancy with fetal demise and current pregnancy; HSV-2 seropositive; Supervision of other high risk pregnancy, antepartum; History of preterm delivery; and Late prenatal care affecting pregnancy in second trimester on their problem list.  Patient reports no complaints.  Contractions: Irregular. Vag. Bleeding: None.  Movement: Present. Denies leaking of fluid.   The following portions of the patient's history were reviewed and updated as appropriate: allergies, current medications, past family history, past medical history, past social history, past surgical history and problem list.   Objective:   Vitals:   12/16/19 1003 12/16/19 1008  BP: 108/71 108/71  Pulse: 85 85  Weight: 201 lb (91.2 kg) 201 lb 4.8 oz (91.3 kg)    Fetal Status: Fetal Heart Rate (bpm): NST Fundal Height: 32 cm Movement: Present     General:  Alert, oriented and cooperative. Patient is in no acute distress.  Skin: Skin is warm and dry. No rash noted.   Cardiovascular: Normal heart rate noted  Respiratory: Normal respiratory effort, no problems with respiration noted  Abdomen: Soft, gravid, appropriate for gestational age.  Pain/Pressure: Present     Pelvic: Cervical exam deferred        Extremities: Normal range of motion.  Edema: Trace  Mental Status: Normal mood and affect. Normal behavior. Normal judgment and thought content.   Assessment and Plan:  Pregnancy: G3P1101 at [redacted]w[redacted]d 1. Supervision of other high risk pregnancy, antepartum Patient is doing well without complaints  2. Prior pregnancy with fetal demise and current pregnancy in third trimester NST reviewed and reactive with baseline 140 , moderate variability, +accels, no decels Follow up MFM scan  tomorrow  3. HSV-2 seropositive Prophylaxis at 36 weeks  4. History of preterm delivery Patient declined 17-P  Preterm labor symptoms and general obstetric precautions including but not limited to vaginal bleeding, contractions, leaking of fluid and fetal movement were reviewed in detail with the patient. Please refer to After Visit Summary for other counseling recommendations.   Return in about 1 week (around 12/23/2019) for in person, ROB, High risk, NST.  Future Appointments  Date Time Provider Department Center  12/17/2019  8:15 AM [facility] NURSE [facility]  12/17/2019  8:15 AM [facility] [place] [facility]  12/24/2019  8:15 AM [facility] NURSE [facility]  12/24/2019  8:15 AM [facility] [place] [facility]  12/31/2019  8:15 AM [facility] NURSE [facility]  12/31/2019  8:15 AM [facility] [place] 4 [facility]    Catalina Antigua, MD

## 2019-12-16 NOTE — Progress Notes (Signed)
Patient reports fetal movement with occasional pressure. 

## 2019-12-17 ENCOUNTER — Ambulatory Visit (HOSPITAL_COMMUNITY)
Admission: RE | Admit: 2019-12-17 | Discharge: 2019-12-17 | Disposition: A | Discharge status: Home / Self Care | Payer: Medicaid Other | Source: Ambulatory Visit | Attending: Maternal & Fetal Medicine | Admitting: Maternal & Fetal Medicine

## 2019-12-17 ENCOUNTER — Encounter (HOSPITAL_COMMUNITY): Payer: Self-pay

## 2019-12-17 ENCOUNTER — Ambulatory Visit (HOSPITAL_COMMUNITY): Payer: Medicaid Other | Admitting: *Deleted

## 2019-12-17 DIAGNOSIS — O09899 Supervision of other high risk pregnancies, unspecified trimester: Secondary | ICD-10-CM

## 2019-12-17 DIAGNOSIS — O0932 Supervision of pregnancy with insufficient antenatal care, second trimester: Secondary | ICD-10-CM

## 2019-12-17 DIAGNOSIS — Z3A32 32 weeks gestation of pregnancy: Secondary | ICD-10-CM

## 2019-12-17 DIAGNOSIS — O09293 Supervision of pregnancy with other poor reproductive or obstetric history, third trimester: Secondary | ICD-10-CM

## 2019-12-17 DIAGNOSIS — O09213 Supervision of pregnancy with history of pre-term labor, third trimester: Secondary | ICD-10-CM

## 2019-12-17 DIAGNOSIS — Z362 Encounter for other antenatal screening follow-up: Secondary | ICD-10-CM | POA: Diagnosis not present

## 2019-12-20 ENCOUNTER — Other Ambulatory Visit: Payer: Self-pay

## 2019-12-20 ENCOUNTER — Inpatient Hospital Stay (HOSPITAL_COMMUNITY)
Admission: AD | Admit: 2019-12-20 | Discharge: 2019-12-21 | Disposition: A | Discharge status: Home / Self Care | Payer: Medicaid Other | Attending: Obstetrics & Gynecology | Admitting: Obstetrics & Gynecology

## 2019-12-20 ENCOUNTER — Encounter (HOSPITAL_COMMUNITY): Payer: Self-pay | Admitting: Obstetrics & Gynecology

## 2019-12-20 DIAGNOSIS — D649 Anemia, unspecified: Secondary | ICD-10-CM | POA: Diagnosis not present

## 2019-12-20 DIAGNOSIS — Z79899 Other long term (current) drug therapy: Secondary | ICD-10-CM | POA: Diagnosis not present

## 2019-12-20 DIAGNOSIS — Z7982 Long term (current) use of aspirin: Secondary | ICD-10-CM | POA: Diagnosis not present

## 2019-12-20 DIAGNOSIS — O99013 Anemia complicating pregnancy, third trimester: Secondary | ICD-10-CM | POA: Insufficient documentation

## 2019-12-20 DIAGNOSIS — O26893 Other specified pregnancy related conditions, third trimester: Secondary | ICD-10-CM | POA: Diagnosis not present

## 2019-12-20 DIAGNOSIS — O4703 False labor before 37 completed weeks of gestation, third trimester: Secondary | ICD-10-CM | POA: Diagnosis not present

## 2019-12-20 DIAGNOSIS — Z3A33 33 weeks gestation of pregnancy: Secondary | ICD-10-CM | POA: Diagnosis not present

## 2019-12-20 DIAGNOSIS — R109 Unspecified abdominal pain: Secondary | ICD-10-CM | POA: Insufficient documentation

## 2019-12-20 DIAGNOSIS — O4700 False labor before 37 completed weeks of gestation, unspecified trimester: Secondary | ICD-10-CM

## 2019-12-20 DIAGNOSIS — Z3689 Encounter for other specified antenatal screening: Secondary | ICD-10-CM | POA: Diagnosis not present

## 2019-12-20 DIAGNOSIS — O479 False labor, unspecified: Secondary | ICD-10-CM

## 2019-12-20 DIAGNOSIS — Z87891 Personal history of nicotine dependence: Secondary | ICD-10-CM | POA: Diagnosis not present

## 2019-12-20 LAB — URINALYSIS, ROUTINE W REFLEX MICROSCOPIC
Bilirubin Urine: NEGATIVE
Glucose, UA: NEGATIVE mg/dL
Hgb urine dipstick: NEGATIVE
Ketones, ur: NEGATIVE mg/dL
Leukocytes,Ua: NEGATIVE
Nitrite: NEGATIVE
Protein, ur: NEGATIVE mg/dL
Specific Gravity, Urine: 1.01 (ref 1.005–1.030)
pH: 7 (ref 5.0–8.0)

## 2019-12-20 LAB — FETAL FIBRONECTIN: Fetal Fibronectin: NEGATIVE

## 2019-12-20 MED ORDER — LACTATED RINGERS IV BOLUS
1000.0000 mL | Freq: Once | INTRAVENOUS | Status: AC
  Administered 2019-12-20: 1000 mL via INTRAVENOUS

## 2019-12-20 NOTE — MAU Note (Signed)
PT SAYS SHE FEELS  CRAMPS - STARTED LAST NIGHT - SAME NOW. Marland Kitchen  PNC WITH  [facility]. LAST SEX- NOV. Marland Kitchen

## 2019-12-20 NOTE — MAU Provider Note (Signed)
History     CSN: [id number]  Arrival date and time: 12/20/19 2105   First Provider Initiated Contact with Patient 12/20/19 2209      Chief Complaint  Patient presents with  . Abdominal Pain   Ms. Adriana [name] is a 24 y.o. G3P1101 at 45w0dwho presents to MAU for PTL evaluation after ctx began yesterday around 12noon. Pt describes the contractions as "mesntrual cycle cramps" that have stayed the same since yesterday. Pt endorses tightening of entire abdomen. Pt describes it as "uncomfortable" but denies pain. Pt reports last intercourse was in November, pt denies anything in the vagina in the past 24hrs.  Pt denies change in vaginal discharge amount/color/consistency, VB, new onset backache, intermittent abdominal pain, pelvic pressure/pain. Pt denies chest pain and SOB.  Pt denies constipation, diarrhea, or urinary problems. Pt denies fever, chills, fatigue, sweating or changes in appetite. Pt denies dizziness, light-headedness, weakness.  Pt denies VB, LOF and reports good FM.  Current pregnancy problems? anemia Blood Type? A Positive Allergies? Imitrex, latex Current medications? Iron, PNVs Current PNC & next appt? [facility], 12/23/2019   OB History    Gravida  3   Para  2   Term  1   Preterm  1   AB      Living  1     SAB      TAB      Ectopic      Multiple  0   Live Births  1           Past Medical History:  Diagnosis Date  . Migraines     Past Surgical History:  Procedure Laterality Date  . HERNIA REPAIR     as an infant    No family history on file.  Social History   Tobacco Use  . Smoking status: Former Smoker    Quit date: 06/30/2016    Years since quitting: 3.4  . Smokeless tobacco: Never Used  Substance Use Topics  . Alcohol use: No    Alcohol/week: 0.0 standard drinks  . Drug use: No    Allergies:  Allergies  Allergen Reactions  . Imitrex [Sumatriptan] Other (See Comments)    Chest pain, heaviness, migraine  . Latex  Rash    Medications Prior to Admission  Medication Sig Dispense Refill Last Dose  . Fe Fum-FePoly-Vit C-Vit B3 (INTEGRA) 62.5-62.5-40-3 MG CAPS Take 1 capsule by mouth daily. 30 capsule 2 12/19/2019 at 1130pm  . Prenatal MV-Min-FA-Omega-3 (PRENATAL GUMMIES/DHA & FA) 0.4-32.5 MG CHEW Chew by mouth.   12/19/2019 at 1130pm  . aspirin EC 81 MG tablet Take 1 tablet (81 mg total) by mouth daily. (Patient not taking: Reported on 09/10/2019) 30 tablet 11   . Blood Pressure Monitoring (BLOOD PRESSURE KIT) DEVI 1 kit by Does not apply route once a week. Check BP Weekly.  Large Cuff.  Dx:Z13.6  Z34.86  O09.0 (Patient not taking: Reported on 12/16/2019) 1 kit 0   . docusate sodium (COLACE) 100 MG capsule Take 1 capsule (100 mg total) by mouth 2 (two) times daily as needed. (Patient not taking: Reported on 11/19/2019) 30 capsule 2   . Elastic Bandages & Supports (COMFORT FIT MATERNITY SUPP MED) MISC 1 Device by Does not apply route daily. 1 each 0   . famotidine (PEPCID) 20 MG tablet Take 1 tablet (20 mg total) by mouth daily. 30 tablet 0   . ferrous sulfate 325 (65 FE) MG EC tablet Take 325 mg by mouth 3 (three)  times daily with meals.     . metoCLOPramide (REGLAN) 10 MG tablet Take 1 tablet (10 mg total) by mouth every 6 (six) hours. (Patient not taking: Reported on 12/16/2019) 30 tablet 0   . polyethylene glycol powder (GLYCOLAX/MIRALAX) 17 GM/SCOOP powder Take 17 g by mouth daily. (Patient not taking: Reported on 11/19/2019) 255 g 0   . promethazine (PHENERGAN) 25 MG tablet TAKE 1 TABLET(25 MG) BY MOUTH EVERY 6 HOURS AS NEEDED FOR NAUSEA OR VOMITING (Patient not taking: Reported on 09/10/2019) 30 tablet 1     Review of Systems  Constitutional: Negative for chills, diaphoresis, fatigue and fever.  Eyes: Negative for visual disturbance.  Respiratory: Negative for shortness of breath.   Cardiovascular: Negative for chest pain.  Gastrointestinal: Positive for abdominal pain (tightening/cramping). Negative for  constipation, diarrhea, nausea and vomiting.  Genitourinary: Negative for dysuria, flank pain, frequency, pelvic pain, urgency, vaginal bleeding and vaginal discharge.  Neurological: Negative for dizziness, weakness, light-headedness and headaches.   Physical Exam   Blood pressure 123/60, pulse 86, temperature 98.1 F (36.7 C), temperature source Oral, resp. rate 18, height '5\' 5"'  (1.651 m), weight 92.3 kg, last menstrual period 05/03/2019, SpO2 99 %, currently breastfeeding.  Patient Vitals for the past 24 hrs:  BP Temp Temp src Pulse Resp SpO2 Height Weight  12/21/19 0014 123/60 -- -- 86 -- 99 % -- --  12/20/19 2123 106/66 98.1 F (36.7 C) Oral 100 18 -- '5\' 5"'  (1.651 m) 92.3 kg   Physical Exam  Constitutional: She is oriented to person, place, and time. She appears well-developed and well-nourished. No distress.  HENT:  Head: Normocephalic and atraumatic.  Respiratory: Effort normal.  GI: Soft.  Genitourinary: There is no rash, tenderness or lesion on the right labia. There is no rash, tenderness or lesion on the left labia.    No vaginal discharge.     Genitourinary Comments: Dilation: Closed Effacement (%): Thick Cervical Position: Posterior Exam by:: Vernice Jefferson, NP (initial exam)   Neurological: She is alert and oriented to person, place, and time.  Skin: Skin is warm and dry. She is not diaphoretic.  Psychiatric: She has a normal mood and affect. Her behavior is normal. Judgment and thought content normal.   Results for orders placed or performed during the hospital encounter of 12/20/19 (from the past 24 hour(s))  Urinalysis, Routine w reflex microscopic     Status: Abnormal   Collection Time: 12/20/19  9:30 PM  Result Value Ref Range   Color, Urine YELLOW YELLOW   APPearance HAZY (A) CLEAR   Specific Gravity, Urine 1.010 1.005 - 1.030   pH 7.0 5.0 - 8.0   Glucose, UA NEGATIVE NEGATIVE mg/dL   Hgb urine dipstick NEGATIVE NEGATIVE   Bilirubin Urine NEGATIVE NEGATIVE    Ketones, ur NEGATIVE NEGATIVE mg/dL   Protein, ur NEGATIVE NEGATIVE mg/dL   Nitrite NEGATIVE NEGATIVE   Leukocytes,Ua NEGATIVE NEGATIVE  Fetal fibronectin     Status: None   Collection Time: 12/20/19 10:22 PM  Result Value Ref Range   Fetal Fibronectin NEGATIVE NEGATIVE    MAU Course  Procedures  MDM -r/o PTL -UA: hazy, sending urine for culture based on symptoms -fFN: negative -initial CE '@1020PM' : long/closed/posterior -EFM: reactive       -baseline: 130/135/140       -variability: moderate       -accels: present, 15x15       -decels: absent       -TOCO: q6-30mn -1L LR given, pt  reports ctx now unchanged, spread out mildly on monitor -repeat CE '@0005' : unchanged -discussed pt not a candidate for Procardia d/t BP; pt declines terbutaline and second bag of fluid and elects to be discharged home -pt discharged to home in stable condition  Orders Placed This Encounter  Procedures  . Culture, OB Urine    Standing Status:   Standing    Number of Occurrences:   1  . Urinalysis, Routine w reflex microscopic    Standing Status:   Standing    Number of Occurrences:   1  . Fetal fibronectin    Standing Status:   Standing    Number of Occurrences:   1  . Insert peripheral IV    Standing Status:   Standing    Number of Occurrences:   1  . Discharge patient    Order Specific Question:   Discharge disposition    Answer:   01-Home or Self Care [1]    Order Specific Question:   Discharge patient date    Answer:   12/21/2019   Meds ordered this encounter  Medications  . lactated ringers bolus 1,000 mL   Assessment and Plan   1. Preterm contractions   2. [redacted] weeks gestation of pregnancy   3. NST (non-stress test) reactive    Allergies as of 12/21/2019      Reactions   Imitrex [sumatriptan] Other (See Comments)   Chest pain, heaviness, migraine   Latex Rash      Medication List    TAKE these medications   aspirin EC 81 MG tablet Take 1 tablet (81 mg total) by mouth  daily.   Blood Pressure Kit Devi 1 kit by Does not apply route once a week. Check BP Weekly.  Large Cuff.  Dx:Z13.6  Z34.86  O09.0   Comfort Fit Maternity Supp Med Misc 1 Device by Does not apply route daily.   docusate sodium 100 MG capsule Commonly known as: COLACE Take 1 capsule (100 mg total) by mouth 2 (two) times daily as needed.   famotidine 20 MG tablet Commonly known as: PEPCID Take 1 tablet (20 mg total) by mouth daily.   ferrous sulfate 325 (65 FE) MG EC tablet Take 325 mg by mouth 3 (three) times daily with meals.   Integra 62.5-62.5-40-3 MG Caps Take 1 capsule by mouth daily.   metoCLOPramide 10 MG tablet Commonly known as: REGLAN Take 1 tablet (10 mg total) by mouth every 6 (six) hours.   polyethylene glycol powder 17 GM/SCOOP powder Commonly known as: GLYCOLAX/MIRALAX Take 17 g by mouth daily.   Prenatal Gummies/DHA & FA 0.4-32.5 MG Chew Chew by mouth.   promethazine 25 MG tablet Commonly known as: PHENERGAN TAKE 1 TABLET(25 MG) BY MOUTH EVERY 6 HOURS AS NEEDED FOR NAUSEA OR VOMITING      -discussed possible course of preterm contractions -discussed Braxton-Hicks vs. labor contractions -strict PTL/return MAU precautions given -pt discharged to home in stable condition  Elmyra Ricks E Raini Tiley 12/21/2019, 12:21 AM

## 2019-12-21 DIAGNOSIS — O4703 False labor before 37 completed weeks of gestation, third trimester: Secondary | ICD-10-CM

## 2019-12-21 DIAGNOSIS — Z3A33 33 weeks gestation of pregnancy: Secondary | ICD-10-CM

## 2019-12-21 NOTE — Discharge Instructions (Signed)
Abdominal Pain During Pregnancy  Abdominal pain is common during pregnancy, and has many possible causes. Some causes are more serious than others, and sometimes the cause is not known. Abdominal pain can be a sign that labor is starting. It can also be caused by normal growth and stretching of muscles and ligaments during pregnancy. Always tell your health care provider if you have any abdominal pain. Follow these instructions at home:  Do not have sex or put anything in your vagina until your pain goes away completely.  Get plenty of rest until your pain improves.  Drink enough fluid to keep your urine pale yellow.  Take over-the-counter and prescription medicines only as told by your health care provider.  Keep all follow-up visits as told by your health care provider. This is important. Contact a health care provider if:  Your pain continues or gets worse after resting.  You have lower abdominal pain that: ? Comes and goes at regular intervals. ? Spreads to your back. ? Is similar to menstrual cramps.  You have pain or burning when you urinate. Get help right away if:  You have a fever or chills.  You have vaginal bleeding.  You are leaking fluid from your vagina.  You are passing tissue from your vagina.  You have vomiting or diarrhea that lasts for more than 24 hours.  Your baby is moving less than usual.  You feel very weak or faint.  You have shortness of breath.  You develop severe pain in your upper abdomen. Summary  Abdominal pain is common during pregnancy, and has many possible causes.  If you experience abdominal pain during pregnancy, tell your health care provider right away.  Follow your health care provider's home care instructions and keep all follow-up visits as directed. This information is not intended to replace advice given to you by your health care provider. Make sure you discuss any questions you have with your health care  provider. Document Revised: 03/04/2019 Document Reviewed: 02/16/2017 Elsevier Patient Education  2020 [employer].  Third Trimester of Pregnancy The third trimester is from week 28 through week 40 (months 7 through 9). The third trimester is a time when the unborn baby (fetus) is growing rapidly. At the end of the ninth month, the fetus is about 20 inches in length and weighs 6-10 pounds. Body changes during your third trimester Your body will continue to go through many changes during pregnancy. The changes vary from woman to woman. During the third trimester:  Your weight will continue to increase. You can expect to gain 25-35 pounds (11-16 kg) by the end of the pregnancy.  You may begin to get stretch marks on your hips, abdomen, and breasts.  You may urinate more often because the fetus is moving lower into your pelvis and pressing on your bladder.  You may develop or continue to have heartburn. This is caused by increased hormones that slow down muscles in the digestive tract.  You may develop or continue to have constipation because increased hormones slow digestion and cause the muscles that push waste through your intestines to relax.  You may develop hemorrhoids. These are swollen veins (varicose veins) in the rectum that can itch or be painful.  You may develop swollen, bulging veins (varicose veins) in your legs.  You may have increased body aches in the pelvis, back, or thighs. This is due to weight gain and increased hormones that are relaxing your joints.  You may have changes in  your hair. These can include thickening of your hair, rapid growth, and changes in texture. Some women also have hair loss during or after pregnancy, or hair that feels dry or thin. Your hair will most likely return to normal after your baby is born.  Your breasts will continue to grow and they will continue to become tender. A yellow fluid (colostrum) may leak from your breasts. This is the first  milk you are producing for your baby.  Your belly button may stick out.  You may notice more swelling in your hands, face, or ankles.  You may have increased tingling or numbness in your hands, arms, and legs. The skin on your belly may also feel numb.  You may feel short of breath because of your expanding uterus.  You may have more problems sleeping. This can be caused by the size of your belly, increased need to urinate, and an increase in your body's metabolism.  You may notice the fetus "dropping," or moving lower in your abdomen (lightening).  You may have increased vaginal discharge.  You may notice your joints feel loose and you may have pain around your pelvic bone. What to expect at prenatal visits You will have prenatal exams every 2 weeks until week 36. Then you will have weekly prenatal exams. During a routine prenatal visit:  You will be weighed to make sure you and the baby are growing normally.  Your blood pressure will be taken.  Your abdomen will be measured to track your baby's growth.  The fetal heartbeat will be listened to.  Any test results from the previous visit will be discussed.  You may have a cervical check near your due date to see if your cervix has softened or thinned (effaced).  You will be tested for Group B streptococcus. This happens between 35 and 37 weeks. Your health care provider may ask you:  What your birth plan is.  How you are feeling.  If you are feeling the baby move.  If you have had any abnormal symptoms, such as leaking fluid, bleeding, severe headaches, or abdominal cramping.  If you are using any tobacco products, including cigarettes, chewing tobacco, and electronic cigarettes.  If you have any questions. Other tests or screenings that may be performed during your third trimester include:  Blood tests that check for low iron levels (anemia).  Fetal testing to check the health, activity level, and growth of the  fetus. Testing is done if you have certain medical conditions or if there are problems during the pregnancy.  Nonstress test (NST). This test checks the health of your baby to make sure there are no signs of problems, such as the baby not getting enough oxygen. During this test, a belt is placed around your belly. The baby is made to move, and its heart rate is monitored during movement. What is false labor? False labor is a condition in which you feel small, irregular tightenings of the muscles in the womb (contractions) that usually go away with rest, changing position, or drinking water. These are called Braxton Hicks contractions. Contractions may last for hours, days, or even weeks before true labor sets in. If contractions come at regular intervals, become more frequent, increase in intensity, or become painful, you should see your health care provider. What are the signs of labor?  Abdominal cramps.  Regular contractions that start at 10 minutes apart and become stronger and more frequent with time.  Contractions that start on the top  of the uterus and spread down to the lower abdomen and back.  Increased pelvic pressure and dull back pain.  A watery or bloody mucus discharge that comes from the vagina.  Leaking of amniotic fluid. This is also known as your "water breaking." It could be a slow trickle or a gush. Let your health care provider know if it has a color or strange odor. If you have any of these signs, call your health care provider right away, even if it is before your due date. Follow these instructions at home: Medicines  Follow your health care provider's instructions regarding medicine use. Specific medicines may be either safe or unsafe to take during pregnancy.  Take a prenatal vitamin that contains at least 600 micrograms (mcg) of folic acid.  If you develop constipation, try taking a stool softener if your health care provider approves. Eating and  drinking   Eat a balanced diet that includes fresh fruits and vegetables, whole grains, good sources of protein such as meat, eggs, or tofu, and low-fat dairy. Your health care provider will help you determine the amount of weight gain that is right for you.  Avoid raw meat and uncooked cheese. These carry germs that can cause birth defects in the baby.  If you have low calcium intake from food, talk to your health care provider about whether you should take a daily calcium supplement.  Eat four or five small meals rather than three large meals a day.  Limit foods that are high in fat and processed sugars, such as fried and sweet foods.  To prevent constipation: ? Drink enough fluid to keep your urine clear or pale yellow. ? Eat foods that are high in fiber, such as fresh fruits and vegetables, whole grains, and beans. Activity  Exercise only as directed by your health care provider. Most women can continue their usual exercise routine during pregnancy. Try to exercise for 30 minutes at least 5 days a week. Stop exercising if you experience uterine contractions.  Avoid heavy lifting.  Do not exercise in extreme heat or humidity, or at high altitudes.  Wear low-heel, comfortable shoes.  Practice good posture.  You may continue to have sex unless your health care provider tells you otherwise. Relieving pain and discomfort  Take frequent breaks and rest with your legs elevated if you have leg cramps or low back pain.  Take warm sitz baths to soothe any pain or discomfort caused by hemorrhoids. Use hemorrhoid cream if your health care provider approves.  Wear a good support bra to prevent discomfort from breast tenderness.  If you develop varicose veins: ? Wear support pantyhose or compression stockings as told by your healthcare provider. ? Elevate your feet for 15 minutes, 3-4 times a day. Prenatal care  Write down your questions. Take them to your prenatal visits.  Keep all  your prenatal visits as told by your health care provider. This is important. Safety  Wear your seat belt at all times when driving.  Make a list of emergency phone numbers, including numbers for family, friends, the hospital, and police and fire departments. General instructions  Avoid cat litter boxes and soil used by cats. These carry germs that can cause birth defects in the baby. If you have a cat, ask someone to clean the litter box for you.  Do not travel far distances unless it is absolutely necessary and only with the approval of your health care provider.  Do not use hot tubs, steam  rooms, or saunas.  Do not drink alcohol.  Do not use any products that contain nicotine or tobacco, such as cigarettes and e-cigarettes. If you need help quitting, ask your health care provider.  Do not use any medicinal herbs or unprescribed drugs. These chemicals affect the formation and growth of the baby.  Do not douche or use tampons or scented sanitary pads.  Do not cross your legs for long periods of time.  To prepare for the arrival of your baby: ? Take prenatal classes to understand, practice, and ask questions about labor and delivery. ? Make a trial run to the hospital. ? Visit the hospital and tour the maternity area. ? Arrange for maternity or paternity leave through employers. ? Arrange for family and friends to take care of pets while you are in the hospital. ? Purchase a rear-facing car seat and make sure you know how to install it in your car. ? Pack your hospital bag. ? Prepare the baby's nursery. Make sure to remove all pillows and stuffed animals from the baby's crib to prevent suffocation.  Visit your dentist if you have not gone during your pregnancy. Use a soft toothbrush to brush your teeth and be gentle when you floss. Contact a health care provider if:  You are unsure if you are in labor or if your water has broken.  You become dizzy.  You have mild pelvic  cramps, pelvic pressure, or nagging pain in your abdominal area.  You have lower back pain.  You have persistent nausea, vomiting, or diarrhea.  You have an unusual or bad smelling vaginal discharge.  You have pain when you urinate. Get help right away if:  Your water breaks before 37 weeks.  You have regular contractions less than 5 minutes apart before 37 weeks.  You have a fever.  You are leaking fluid from your vagina.  You have spotting or bleeding from your vagina.  You have severe abdominal pain or cramping.  You have rapid weight loss or weight gain.  You have shortness of breath with chest pain.  You notice sudden or extreme swelling of your face, hands, ankles, feet, or legs.  Your baby makes fewer than 10 movements in 2 hours.  You have severe headaches that do not go away when you take medicine.  You have vision changes. Summary  The third trimester is from week 28 through week 40, months 7 through 9. The third trimester is a time when the unborn baby (fetus) is growing rapidly.  During the third trimester, your discomfort may increase as you and your baby continue to gain weight. You may have abdominal, leg, and back pain, sleeping problems, and an increased need to urinate.  During the third trimester your breasts will keep growing and they will continue to become tender. A yellow fluid (colostrum) may leak from your breasts. This is the first milk you are producing for your baby.  False labor is a condition in which you feel small, irregular tightenings of the muscles in the womb (contractions) that eventually go away. These are called Braxton Hicks contractions. Contractions may last for hours, days, or even weeks before true labor sets in.  Signs of labor can include: abdominal cramps; regular contractions that start at 10 minutes apart and become stronger and more frequent with time; watery or bloody mucus discharge that comes from the vagina; increased  pelvic pressure and dull back pain; and leaking of amniotic fluid. This information is not intended to  to replace advice given to you by your health care provider. Make sure you discuss any questions you have with your health care provider. Document Revised: 03/07/2019 Document Reviewed: 12/20/2016 Elsevier Patient Education  2020 [employer] Preterm Labor and Birth Information  The normal length of a pregnancy is 39-41 weeks. Preterm labor is when labor starts before 37 completed weeks of pregnancy. What are the risk factors for preterm labor? Preterm labor is more likely to occur in women who:  Have certain infections during pregnancy such as a bladder infection, sexually transmitted infection, or infection inside the uterus (chorioamnionitis).  Have a shorter-than-normal cervix.  Have gone into preterm labor before.  Have had surgery on their cervix.  Are younger than age 17 or older than age 35.  Are African American.  Are pregnant with twins or multiple babies (multiple gestation).  Take street drugs or smoke while pregnant.  Do not gain enough weight while pregnant.  Became pregnant shortly after having been pregnant. What are the symptoms of preterm labor? Symptoms of preterm labor include:  Cramps similar to those that can happen during a menstrual period. The cramps may happen with diarrhea.  Pain in the abdomen or lower back.  Regular uterine contractions that may feel like tightening of the abdomen.  A feeling of increased pressure in the pelvis.  Increased watery or bloody mucus discharge from the vagina.  Water breaking (ruptured amniotic sac). Why is it important to recognize signs of preterm labor? It is important to recognize signs of preterm labor because babies who are born prematurely may not be fully developed. This can put them at an increased risk for:  Long-term (chronic) heart and lung problems.  Difficulty immediately after birth with  regulating body systems, including blood sugar, body temperature, heart rate, and breathing rate.  Bleeding in the brain.  Cerebral palsy.  Learning difficulties.  Death. These risks are highest for babies who are born before 34 weeks of pregnancy. How is preterm labor treated? Treatment depends on the length of your pregnancy, your condition, and the health of your baby. It may involve:  Having a stitch (suture) placed in your cervix to prevent your cervix from opening too early (cerclage).  Taking or being given medicines, such as: ? Hormone medicines. These may be given early in pregnancy to help support the pregnancy. ? Medicine to stop contractions. ? Medicines to help mature the baby's lungs. These may be prescribed if the risk of delivery is high. ? Medicines to prevent your baby from developing cerebral palsy. If the labor happens before 34 weeks of pregnancy, you may need to stay in the hospital. What should I do if I think I am in preterm labor? If you think that you are going into preterm labor, call your health care provider right away. How can I prevent preterm labor in future pregnancies? To increase your chance of having a full-term pregnancy:  Do not use any tobacco products, such as cigarettes, chewing tobacco, and e-cigarettes. If you need help quitting, ask your health care provider.  Do not use street drugs or medicines that have not been prescribed to you during your pregnancy.  Talk with your health care provider before taking any herbal supplements, even if you have been taking them regularly.  Make sure you gain a healthy amount of weight during your pregnancy.  Watch for infection. If you think that you might have an infection, get it checked right away.  Make sure to tell your   health care provider if you have gone into preterm labor before. This information is not intended to replace advice given to you by your health care provider. Make sure you discuss  any questions you have with your health care provider. Document Revised: 03/08/2019 Document Reviewed: 04/06/2016 Elsevier Patient Education  2020 [employer] Inc.  

## 2019-12-22 ENCOUNTER — Inpatient Hospital Stay (HOSPITAL_COMMUNITY)
Admission: AD | Admit: 2019-12-22 | Discharge: 2019-12-22 | Disposition: A | Discharge status: Home / Self Care | Payer: Medicaid Other | Attending: Obstetrics & Gynecology | Admitting: Obstetrics & Gynecology

## 2019-12-22 ENCOUNTER — Other Ambulatory Visit: Payer: Self-pay

## 2019-12-22 ENCOUNTER — Encounter (HOSPITAL_COMMUNITY): Payer: Self-pay | Admitting: Obstetrics & Gynecology

## 2019-12-22 DIAGNOSIS — Z87891 Personal history of nicotine dependence: Secondary | ICD-10-CM | POA: Diagnosis not present

## 2019-12-22 DIAGNOSIS — O26899 Other specified pregnancy related conditions, unspecified trimester: Secondary | ICD-10-CM

## 2019-12-22 DIAGNOSIS — O4703 False labor before 37 completed weeks of gestation, third trimester: Secondary | ICD-10-CM | POA: Diagnosis not present

## 2019-12-22 DIAGNOSIS — O479 False labor, unspecified: Secondary | ICD-10-CM

## 2019-12-22 DIAGNOSIS — R519 Headache, unspecified: Secondary | ICD-10-CM | POA: Insufficient documentation

## 2019-12-22 DIAGNOSIS — Z91040 Latex allergy status: Secondary | ICD-10-CM | POA: Diagnosis not present

## 2019-12-22 DIAGNOSIS — R109 Unspecified abdominal pain: Secondary | ICD-10-CM | POA: Diagnosis not present

## 2019-12-22 DIAGNOSIS — Z888 Allergy status to other drugs, medicaments and biological substances status: Secondary | ICD-10-CM | POA: Insufficient documentation

## 2019-12-22 DIAGNOSIS — O26893 Other specified pregnancy related conditions, third trimester: Secondary | ICD-10-CM | POA: Diagnosis not present

## 2019-12-22 DIAGNOSIS — Z3A33 33 weeks gestation of pregnancy: Secondary | ICD-10-CM | POA: Diagnosis not present

## 2019-12-22 LAB — CULTURE, OB URINE: Culture: >=100000 — AB

## 2019-12-22 LAB — URINALYSIS, ROUTINE W REFLEX MICROSCOPIC
Bilirubin Urine: NEGATIVE
Glucose, UA: NEGATIVE mg/dL
Hgb urine dipstick: NEGATIVE
Ketones, ur: NEGATIVE mg/dL
Nitrite: NEGATIVE
Protein, ur: NEGATIVE mg/dL
Specific Gravity, Urine: 1.009 (ref 1.005–1.030)
pH: 7 (ref 5.0–8.0)

## 2019-12-22 MED ORDER — ACETAMINOPHEN 500 MG PO TABS
1000.0000 mg | ORAL_TABLET | Freq: Once | ORAL | Status: AC
  Administered 2019-12-22: 1000 mg via ORAL
  Filled 2019-12-22: qty 2

## 2019-12-22 NOTE — MAU Note (Signed)
Pt reports contractions that started 3 days ago. Pt reports that the contractions are every 6 mins or so apart. Was here last night. Feeling more pain in back and legs and rectal pressure. Rates 5/10. Denies vaginal bleeding or discharge. Reports good fetal movement. Pt also reports intermittent HA that started Wednesday. Was taking Tylenol, but doesn't help. Rates 5/10.

## 2019-12-22 NOTE — MAU Provider Note (Signed)
History     CSN: [id number]  Arrival date and time: 12/22/19 9702   First Provider Initiated Contact with Patient 12/22/19 0110      Chief Complaint  Patient presents with  . Contractions  . Headache   HPI   Ms.Adriana [name] is a 24 y.o. female G3P1101 @ 22w2dhere in MAU with complaints of contractions. She has a history of previous IUD @ 36 weeks. The contractions started 3 days ago. The contractions feel like tightening in her abdomen and menstrual like cramps. No vaginal bleeding. + fetal movement. She has not taken anything for the contractions.  + frontal HA. Hasn't taken anything for the HA. Would like to try some tylenol. Hx of migraine HA's   OB History    Gravida  3   Para  2   Term  1   Preterm  1   AB      Living  1     SAB      TAB      Ectopic      Multiple  0   Live Births  1           Past Medical History:  Diagnosis Date  . Migraines     Past Surgical History:  Procedure Laterality Date  . HERNIA REPAIR     as an infant    History reviewed. No pertinent family history.  Social History   Tobacco Use  . Smoking status: Former Smoker    Quit date: 06/30/2016    Years since quitting: 3.4  . Smokeless tobacco: Never Used  Substance Use Topics  . Alcohol use: No    Alcohol/week: 0.0 standard drinks  . Drug use: No    Allergies:  Allergies  Allergen Reactions  . Imitrex [Sumatriptan] Other (See Comments)    Chest pain, heaviness, migraine  . Latex Rash    Medications Prior to Admission  Medication Sig Dispense Refill Last Dose  . Fe Fum-FePoly-Vit C-Vit B3 (INTEGRA) 62.5-62.5-40-3 MG CAPS Take 1 capsule by mouth daily. 30 capsule 2 12/21/2019 at 1030am  . Prenatal MV-Min-FA-Omega-3 (PRENATAL GUMMIES/DHA & FA) 0.4-32.5 MG CHEW Chew by mouth.   12/21/2019 at 1030  . aspirin EC 81 MG tablet Take 1 tablet (81 mg total) by mouth daily. (Patient not taking: Reported on 09/10/2019) 30 tablet 11   . Blood Pressure Monitoring (BLOOD  PRESSURE KIT) DEVI 1 kit by Does not apply route once a week. Check BP Weekly.  Large Cuff.  Dx:Z13.6  Z34.86  O09.0 (Patient not taking: Reported on 12/16/2019) 1 kit 0   . docusate sodium (COLACE) 100 MG capsule Take 1 capsule (100 mg total) by mouth 2 (two) times daily as needed. (Patient not taking: Reported on 11/19/2019) 30 capsule 2   . Elastic Bandages & Supports (COMFORT FIT MATERNITY SUPP MED) MISC 1 Device by Does not apply route daily. 1 each 0   . famotidine (PEPCID) 20 MG tablet Take 1 tablet (20 mg total) by mouth daily. 30 tablet 0   . ferrous sulfate 325 (65 FE) MG EC tablet Take 325 mg by mouth 3 (three) times daily with meals.     . metoCLOPramide (REGLAN) 10 MG tablet Take 1 tablet (10 mg total) by mouth every 6 (six) hours. (Patient not taking: Reported on 12/16/2019) 30 tablet 0   . polyethylene glycol powder (GLYCOLAX/MIRALAX) 17 GM/SCOOP powder Take 17 g by mouth daily. (Patient not taking: Reported on 11/19/2019) 255 g 0   .  promethazine (PHENERGAN) 25 MG tablet TAKE 1 TABLET(25 MG) BY MOUTH EVERY 6 HOURS AS NEEDED FOR NAUSEA OR VOMITING (Patient not taking: Reported on 09/10/2019) 30 tablet 1     Review of Systems  Gastrointestinal: Positive for abdominal pain.  Genitourinary: Negative for pelvic pain, vaginal bleeding, vaginal discharge and vaginal pain.   Physical Exam   Blood pressure 116/63, pulse 99, temperature 98.1 F (36.7 C), temperature source Oral, resp. rate 20, height _0  (1.651 m), weight 93 kg, last menstrual period 05/03/2019, SpO2 100 %, currently breastfeeding.  Physical Exam  Constitutional: She is oriented to person, place, and time. She appears well-developed and well-nourished. No distress.  Respiratory: Effort normal.  GI: Soft.  Genitourinary:    Genitourinary Comments: Dilation: Closed Effacement (%): Thick Cervical Position: Posterior Exam by:: Noni Saupe, NP   Musculoskeletal:        General: Normal range of motion.  Neurological:  She is alert and oriented to person, place, and time.  Skin: Skin is warm. She is not diaphoretic.  Psychiatric: Her behavior is normal.  Fetal tracing reactive: baseline 135 bpm, 15x15 accels, no decels, UI.   MAU Course  Procedures  None  MDM  FFN negative on 1/22 Cervix unchanged from previous exam Tylenol 1 gram given and relieved her HA pain.  Patient feels reassured.   Assessment and Plan   A:  1. Abdominal pain during pregnancy, antepartum   2. Braxton Hicks contractions   3. [redacted] weeks gestation of pregnancy     P:  Discharge home in stable condition Return to MAU if symptoms worsen F/u with OB office Ok to use OTC tylenol for HA pain, as directed on the bottle  Adriana [name], Anderson Malta I, NP 12/24/2019 7:13 AM

## 2019-12-22 NOTE — Discharge Instructions (Signed)
Abdominal Pain During Pregnancy  Belly (abdominal) pain is common during pregnancy. There are many possible causes. Most of the time, it is not a serious problem. Other times, it can be a sign that something is wrong with the pregnancy. Always tell your doctor if you have belly pain. Follow these instructions at home:  Do not have sex or put anything in your vagina until your pain goes away completely.  Get plenty of rest until your pain gets better.  Drink enough fluid to keep your pee (urine) pale yellow.  Take over-the-counter and prescription medicines only as told by your doctor.  Keep all follow-up visits as told by your doctor. This is important. Contact a doctor if:  Your pain continues or gets worse after resting.  You have lower belly pain that: ? Comes and goes at regular times. ? Spreads to your back. ? Feels like menstrual cramps.  You have pain or burning when you pee (urinate). Get help right away if:  You have a fever or chills.  You have vaginal bleeding.  You are leaking fluid from your vagina.  You are passing tissue from your vagina.  You throw up (vomit) for more than 24 hours.  You have watery poop (diarrhea) for more than 24 hours.  Your baby is moving less than usual.  You feel very weak or faint.  You have shortness of breath.  You have very bad pain in your upper belly. Summary  Belly (abdominal) pain is common during pregnancy. There are many possible causes.  If you have belly pain during pregnancy, tell your doctor right away.  Keep all follow-up visits as told by your doctor. This is important. This information is not intended to replace advice given to you by your health care provider. Make sure you discuss any questions you have with your health care provider. Document Revised: 03/04/2019 Document Reviewed: 02/16/2017 Elsevier Patient Education  2020 Elsevier Inc.  

## 2019-12-23 ENCOUNTER — Other Ambulatory Visit: Payer: Self-pay

## 2019-12-23 ENCOUNTER — Ambulatory Visit (INDEPENDENT_AMBULATORY_CARE_PROVIDER_SITE_OTHER): Payer: Medicaid Other | Admitting: Obstetrics and Gynecology

## 2019-12-23 ENCOUNTER — Encounter: Payer: Self-pay | Admitting: Obstetrics and Gynecology

## 2019-12-23 VITALS — BP 108/67 | HR 88 | Wt 203.0 lb

## 2019-12-23 DIAGNOSIS — O09293 Supervision of pregnancy with other poor reproductive or obstetric history, third trimester: Secondary | ICD-10-CM

## 2019-12-23 DIAGNOSIS — R768 Other specified abnormal immunological findings in serum: Secondary | ICD-10-CM

## 2019-12-23 DIAGNOSIS — O09893 Supervision of other high risk pregnancies, third trimester: Secondary | ICD-10-CM

## 2019-12-23 DIAGNOSIS — Z3A33 33 weeks gestation of pregnancy: Secondary | ICD-10-CM | POA: Diagnosis not present

## 2019-12-23 DIAGNOSIS — O09899 Supervision of other high risk pregnancies, unspecified trimester: Secondary | ICD-10-CM

## 2019-12-23 DIAGNOSIS — Z8751 Personal history of pre-term labor: Secondary | ICD-10-CM

## 2019-12-23 NOTE — Progress Notes (Signed)
   PRENATAL VISIT NOTE  Subjective:  Adriana [name] is a 24 y.o. G3P1101 at [redacted]w[redacted]d being seen today for ongoing prenatal care.  She is currently monitored for the following issues for this high-risk pregnancy and has Migraine headache with aura; Prior pregnancy with fetal demise and current pregnancy; HSV-2 seropositive; Supervision of other high risk pregnancy, antepartum; History of preterm delivery; and Late prenatal care affecting pregnancy in second trimester on their problem list.  Patient reports no complaints.  Contractions: Irregular. Vag. Bleeding: None.  Movement: Present. Denies leaking of fluid.   The following portions of the patient's history were reviewed and updated as appropriate: allergies, current medications, past family history, past medical history, past social history, past surgical history and problem list.   Objective:   Vitals:   12/23/19 1010  BP: 108/67  Pulse: 88  Weight: 203 lb (92.1 kg)    Fetal Status: Fetal Heart Rate (bpm): NST Fundal Height: 33 cm Movement: Present     General:  Alert, oriented and cooperative. Patient is in no acute distress.  Skin: Skin is warm and dry. No rash noted.   Cardiovascular: Normal heart rate noted  Respiratory: Normal respiratory effort, no problems with respiration noted  Abdomen: Soft, gravid, appropriate for gestational age.  Pain/Pressure: Present     Pelvic: Cervical exam deferred        Extremities: Normal range of motion.  Edema: Trace  Mental Status: Normal mood and affect. Normal behavior. Normal judgment and thought content.   Assessment and Plan:  Pregnancy: G3P1101 at [redacted]w[redacted]d 1. Supervision of other high risk pregnancy, antepartum Patient is doing well without complaints  2. History of preterm delivery Previous 36 week delivery. Patient previously declined 17-P  3. Prior pregnancy with fetal demise and current pregnancy in third trimester Plan for delivery by 39 weeks Continue weekly antenatal  testing NST reviewed and reactive with baseline 140, mod variability, +accels, no decels Follow up MFM scan - Fetal nonstress test  4. HSV-2 seropositive Valtrex prophylaxis to start in a few weeks  Preterm labor symptoms and general obstetric precautions including but not limited to vaginal bleeding, contractions, leaking of fluid and fetal movement were reviewed in detail with the patient. Please refer to After Visit Summary for other counseling recommendations.   Return in about 1 week (around 12/30/2019) for in person, ROB, High risk, NST.  Future Appointments  Date Time Provider Department Center  12/24/2019  8:15 AM [facility] NURSE [facility]  12/24/2019  8:15 AM [facility] [place] [facility]  12/31/2019  8:15 AM [facility] NURSE [facility]  12/31/2019  8:15 AM [facility] [place] [facility]    Catalina Antigua, MD

## 2019-12-24 ENCOUNTER — Ambulatory Visit (HOSPITAL_COMMUNITY)
Admission: RE | Admit: 2019-12-24 | Discharge: 2019-12-24 | Disposition: A | Discharge status: Home / Self Care | Payer: Medicaid Other | Source: Ambulatory Visit | Attending: Maternal & Fetal Medicine | Admitting: Maternal & Fetal Medicine

## 2019-12-24 ENCOUNTER — Encounter (HOSPITAL_COMMUNITY): Payer: Self-pay

## 2019-12-24 ENCOUNTER — Ambulatory Visit (HOSPITAL_COMMUNITY): Payer: Medicaid Other | Admitting: *Deleted

## 2019-12-24 ENCOUNTER — Other Ambulatory Visit (HOSPITAL_COMMUNITY): Payer: Self-pay | Admitting: Maternal & Fetal Medicine

## 2019-12-24 VITALS — BP 106/66 | HR 89 | Temp 97.1°F

## 2019-12-24 DIAGNOSIS — Z8759 Personal history of other complications of pregnancy, childbirth and the puerperium: Secondary | ICD-10-CM

## 2019-12-24 DIAGNOSIS — O0932 Supervision of pregnancy with insufficient antenatal care, second trimester: Secondary | ICD-10-CM | POA: Insufficient documentation

## 2019-12-24 DIAGNOSIS — O09899 Supervision of other high risk pregnancies, unspecified trimester: Secondary | ICD-10-CM | POA: Insufficient documentation

## 2019-12-24 DIAGNOSIS — Z3A33 33 weeks gestation of pregnancy: Secondary | ICD-10-CM | POA: Diagnosis not present

## 2019-12-24 DIAGNOSIS — O0990 Supervision of high risk pregnancy, unspecified, unspecified trimester: Secondary | ICD-10-CM | POA: Insufficient documentation

## 2019-12-24 DIAGNOSIS — O09293 Supervision of pregnancy with other poor reproductive or obstetric history, third trimester: Secondary | ICD-10-CM

## 2019-12-24 DIAGNOSIS — O09213 Supervision of pregnancy with history of pre-term labor, third trimester: Secondary | ICD-10-CM | POA: Diagnosis not present

## 2019-12-24 NOTE — Procedures (Signed)
Adriana [name] [birth year] [redacted]w[redacted]d  Fetus A Non-Stress Test Interpretation for 12/24/19  Indication: Unsatisfactory BPP  Fetal Heart Rate A Mode: External Baseline Rate (A): 140 bpm Variability: Moderate Accelerations: 15 x 15 Decelerations: None Multiple birth?: No  Uterine Activity Mode: Palpation, Toco Contraction Frequency (min): None Resting Tone Palpated: Relaxed Resting Time: Adequate  Interpretation (Fetal Testing) Nonstress Test Interpretation: Reactive Comments: EFM tracing reviewed by Dr. Parke Poisson

## 2019-12-27 ENCOUNTER — Inpatient Hospital Stay (HOSPITAL_COMMUNITY)
Admission: AD | Admit: 2019-12-27 | Discharge: 2019-12-27 | Disposition: A | Discharge status: Home / Self Care | Payer: Medicaid Other | Attending: Obstetrics and Gynecology | Admitting: Obstetrics and Gynecology

## 2019-12-27 ENCOUNTER — Encounter (HOSPITAL_COMMUNITY): Payer: Self-pay | Admitting: Obstetrics and Gynecology

## 2019-12-27 ENCOUNTER — Other Ambulatory Visit: Payer: Self-pay

## 2019-12-27 DIAGNOSIS — Z3689 Encounter for other specified antenatal screening: Secondary | ICD-10-CM

## 2019-12-27 DIAGNOSIS — Z7982 Long term (current) use of aspirin: Secondary | ICD-10-CM | POA: Insufficient documentation

## 2019-12-27 DIAGNOSIS — O368130 Decreased fetal movements, third trimester, not applicable or unspecified: Secondary | ICD-10-CM | POA: Diagnosis not present

## 2019-12-27 DIAGNOSIS — Z3A34 34 weeks gestation of pregnancy: Secondary | ICD-10-CM | POA: Diagnosis not present

## 2019-12-27 DIAGNOSIS — Z87891 Personal history of nicotine dependence: Secondary | ICD-10-CM | POA: Insufficient documentation

## 2019-12-27 NOTE — MAU Provider Note (Signed)
Chief Complaint:  Decreased Fetal Movement   First Provider Initiated Contact with Patient 12/27/19 0226      HPI: Adriana [name] is a 24 y.o. G3P1101 at 19w0dho presents to maternity admissions reporting decreased fetal movement.  Has had some painless contractions tonight, didn't realize they were contractions. . She reports good fetal movement, denies LOF, vaginal bleeding, vaginal itching/burning, urinary symptoms, h/a, dizziness, n/v, diarrhea, constipation or fever/chills.  She denies headache, visual changes or RUQ abdominal pain.  RN Note: Patient reports to MAU c/o NO FM since 2330 last night. Pt denies bleeding or LOF. Pt reports some mild abdominal discomfort due to the way he is laying  Past Medical History: Past Medical History:  Diagnosis Date  . Migraines     Past obstetric history: OB History  Gravida Para Term Preterm AB Living  '3 2 1 1   1  ' SAB TAB Ectopic Multiple Live Births        0 1    # Outcome Date GA Lbr Len/2nd Weight Sex Delivery Anes PTL Lv  3 Current           2 Term 02/11/19 323w1d0:40 / 00:18 3000 g F Vag-Spont EPI  FD  1 Preterm 02/09/17 3672w5d:39 / 01:44 2915 g F Vag-Spont EPI  LIV     Birth Comments: WNL    Past Surgical History: Past Surgical History:  Procedure Laterality Date  . HERNIA REPAIR     as an infant    Family History: History reviewed. No pertinent family history.  Social History: Social History   Tobacco Use  . Smoking status: Former Smoker    Quit date: 06/30/2016    Years since quitting: 3.4  . Smokeless tobacco: Never Used  Substance Use Topics  . Alcohol use: No    Alcohol/week: 0.0 standard drinks  . Drug use: No    Allergies:  Allergies  Allergen Reactions  . Imitrex [Sumatriptan] Other (See Comments)    Chest pain, heaviness, migraine  . Latex Rash    Meds:  Medications Prior to Admission  Medication Sig Dispense Refill Last Dose  . Elastic Bandages & Supports (COMFORT FIT MATERNITY SUPP MED)  MISC 1 Device by Does not apply route daily. 1 each 0 Past Month at Unknown time  . Fe Fum-FePoly-Vit C-Vit B3 (INTEGRA) 62.5-62.5-40-3 MG CAPS Take 1 capsule by mouth daily. 30 capsule 2 12/26/2019 at Unknown time  . Prenatal MV-Min-FA-Omega-3 (PRENATAL GUMMIES/DHA & FA) 0.4-32.5 MG CHEW Chew by mouth.   12/26/2019 at e  . aspirin EC 81 MG tablet Take 1 tablet (81 mg total) by mouth daily. (Patient not taking: Reported on 09/10/2019) 30 tablet 11   . Blood Pressure Monitoring (BLOOD PRESSURE KIT) DEVI 1 kit by Does not apply route once a week. Check BP Weekly.  Large Cuff.  Dx:Z13.6  Z34.86  O09.0 (Patient not taking: Reported on 12/16/2019) 1 kit 0   . docusate sodium (COLACE) 100 MG capsule Take 1 capsule (100 mg total) by mouth 2 (two) times daily as needed. (Patient not taking: Reported on 11/19/2019) 30 capsule 2   . famotidine (PEPCID) 20 MG tablet Take 1 tablet (20 mg total) by mouth daily. 30 tablet 0   . ferrous sulfate 325 (65 FE) MG EC tablet Take 325 mg by mouth 3 (three) times daily with meals.     . metoCLOPramide (REGLAN) 10 MG tablet Take 1 tablet (10 mg total) by mouth every 6 (six) hours. (Patient not taking:  Reported on 12/16/2019) 30 tablet 0   . polyethylene glycol powder (GLYCOLAX/MIRALAX) 17 GM/SCOOP powder Take 17 g by mouth daily. (Patient not taking: Reported on 11/19/2019) 255 g 0   . promethazine (PHENERGAN) 25 MG tablet TAKE 1 TABLET(25 MG) BY MOUTH EVERY 6 HOURS AS NEEDED FOR NAUSEA OR VOMITING (Patient not taking: Reported on 09/10/2019) 30 tablet 1     I have reviewed patient's Past Medical Hx, Surgical Hx, Family Hx, Social Hx, medications and allergies.   ROS:  Review of Systems  Constitutional: Negative for activity change, chills and fever.  Respiratory: Negative for shortness of breath.   Cardiovascular: Negative for leg swelling.  Gastrointestinal: Negative for abdominal pain, constipation, diarrhea and nausea.  Genitourinary: Negative for pelvic pain and  vaginal bleeding.  Musculoskeletal: Negative for back pain.  Neurological: Negative for dizziness.   Other systems negative  Physical Exam   Vitals:   12/27/19 0300  BP: 115/71  Pulse: 89  Resp: 17  Temp: 98.2 F (36.8 C)    Constitutional: Well-developed, well-nourished female in no acute distress.  Cardiovascular: normal rate and rhythm Respiratory: normal effort GI: Abd soft, non-tender, gravid appropriate for gestational age.   No rebound or guarding. MS: Extremities nontender, no edema, normal ROM Neurologic: Alert and oriented x 4.  GU: Neg CVAT.  PELVIC EXAM:deferred   FHT:  Baseline 140 , moderate variability, accelerations present, no decelerations Contractions: Rare   Labs: No results found for this or any previous visit (from the past 24 hour(s)). A/Positive/-- (09/16 1718)  Imaging:    MAU Course/MDM: Monitored for about an hour.  NST reviewed and was reassuring throughout.  +FM while here.   Treatments in MAU included EFM.    Assessment: Single IUP at 68w0dDecreased fetal movement History of IUFD at term  Plan: Discharge home Monitor fetal movement Preterm Labor precautions and fetal kick counts Follow up in Office for prenatal visits   Encouraged to return here or to other Urgent Care/ED if she develops worsening of symptoms, increase in pain, fever, or other concerning symptoms.  Pt stable at time of discharge.  MHansel FeinsteinCNM, MSN Certified Nurse-Midwife 12/27/2019 2:27 AM

## 2019-12-27 NOTE — MAU Note (Signed)
Patient reports to MAU c/o NO FM since 2330 last night. Pt denies bleeding or LOF. Pt reports some mild abdominal discomfort due to the way he is laying.

## 2019-12-27 NOTE — Discharge Instructions (Signed)
Third Trimester of Pregnancy The third trimester is from week 28 through week 40 (months 7 through 9). The third trimester is a time when the unborn baby (fetus) is growing rapidly. At the end of the ninth month, the fetus is about 20 inches in length and weighs 6-10 pounds. Body changes during your third trimester Your body will continue to go through many changes during pregnancy. The changes vary from woman to woman. During the third trimester:  Your weight will continue to increase. You can expect to gain 25-35 pounds (11-16 kg) by the end of the pregnancy.  You may begin to get stretch marks on your hips, abdomen, and breasts.  You may urinate more often because the fetus is moving lower into your pelvis and pressing on your bladder.  You may develop or continue to have heartburn. This is caused by increased hormones that slow down muscles in the digestive tract.  You may develop or continue to have constipation because increased hormones slow digestion and cause the muscles that push waste through your intestines to relax.  You may develop hemorrhoids. These are swollen veins (varicose veins) in the rectum that can itch or be painful.  You may develop swollen, bulging veins (varicose veins) in your legs.  You may have increased body aches in the pelvis, back, or thighs. This is due to weight gain and increased hormones that are relaxing your joints.  You may have changes in your hair. These can include thickening of your hair, rapid growth, and changes in texture. Some women also have hair loss during or after pregnancy, or hair that feels dry or thin. Your hair will most likely return to normal after your baby is born.  Your breasts will continue to grow and they will continue to become tender. A yellow fluid (colostrum) may leak from your breasts. This is the first milk you are producing for your baby.  Your belly button may stick out.  You may notice more swelling in your hands,  face, or ankles.  You may have increased tingling or numbness in your hands, arms, and legs. The skin on your belly may also feel numb.  You may feel short of breath because of your expanding uterus.  You may have more problems sleeping. This can be caused by the size of your belly, increased need to urinate, and an increase in your body's metabolism.  You may notice the fetus "dropping," or moving lower in your abdomen (lightening).  You may have increased vaginal discharge.  You may notice your joints feel loose and you may have pain around your pelvic bone. What to expect at prenatal visits You will have prenatal exams every 2 weeks until week 36. Then you will have weekly prenatal exams. During a routine prenatal visit:  You will be weighed to make sure you and the baby are growing normally.  Your blood pressure will be taken.  Your abdomen will be measured to track your baby's growth.  The fetal heartbeat will be listened to.  Any test results from the previous visit will be discussed.  You may have a cervical check near your due date to see if your cervix has softened or thinned (effaced).  You will be tested for Group B streptococcus. This happens between 35 and 37 weeks. Your health care provider may ask you:  What your birth plan is.  How you are feeling.  If you are feeling the baby move.  If you have had any abnormal   symptoms, such as leaking fluid, bleeding, severe headaches, or abdominal cramping.  If you are using any tobacco products, including cigarettes, chewing tobacco, and electronic cigarettes.  If you have any questions. Other tests or screenings that may be performed during your third trimester include:  Blood tests that check for low iron levels (anemia).  Fetal testing to check the health, activity level, and growth of the fetus. Testing is done if you have certain medical conditions or if there are problems during the pregnancy.  Nonstress test  (NST). This test checks the health of your baby to make sure there are no signs of problems, such as the baby not getting enough oxygen. During this test, a belt is placed around your belly. The baby is made to move, and its heart rate is monitored during movement. What is false labor? False labor is a condition in which you feel small, irregular tightenings of the muscles in the womb (contractions) that usually go away with rest, changing position, or drinking water. These are called Braxton Hicks contractions. Contractions may last for hours, days, or even weeks before true labor sets in. If contractions come at regular intervals, become more frequent, increase in intensity, or become painful, you should see your health care provider. What are the signs of labor?  Abdominal cramps.  Regular contractions that start at 10 minutes apart and become stronger and more frequent with time.  Contractions that start on the top of the uterus and spread down to the lower abdomen and back.  Increased pelvic pressure and dull back pain.  A watery or bloody mucus discharge that comes from the vagina.  Leaking of amniotic fluid. This is also known as your "water breaking." It could be a slow trickle or a gush. Let your health care provider know if it has a color or strange odor. If you have any of these signs, call your health care provider right away, even if it is before your due date. Follow these instructions at home: Medicines  Follow your health care provider's instructions regarding medicine use. Specific medicines may be either safe or unsafe to take during pregnancy.  Take a prenatal vitamin that contains at least 600 micrograms (mcg) of folic acid.  If you develop constipation, try taking a stool softener if your health care provider approves. Eating and drinking   Eat a balanced diet that includes fresh fruits and vegetables, whole grains, good sources of protein such as meat, eggs, or tofu,  and low-fat dairy. Your health care provider will help you determine the amount of weight gain that is right for you.  Avoid raw meat and uncooked cheese. These carry germs that can cause birth defects in the baby.  If you have low calcium intake from food, talk to your health care provider about whether you should take a daily calcium supplement.  Eat four or five small meals rather than three large meals a day.  Limit foods that are high in fat and processed sugars, such as fried and sweet foods.  To prevent constipation: ? Drink enough fluid to keep your urine clear or pale yellow. ? Eat foods that are high in fiber, such as fresh fruits and vegetables, whole grains, and beans. Activity  Exercise only as directed by your health care provider. Most women can continue their usual exercise routine during pregnancy. Try to exercise for 30 minutes at least 5 days a week. Stop exercising if you experience uterine contractions.  Avoid heavy lifting.  Do   not exercise in extreme heat or humidity, or at high altitudes.  Wear low-heel, comfortable shoes.  Practice good posture.  You may continue to have sex unless your health care provider tells you otherwise. Relieving pain and discomfort  Take frequent breaks and rest with your legs elevated if you have leg cramps or low back pain.  Take warm sitz baths to soothe any pain or discomfort caused by hemorrhoids. Use hemorrhoid cream if your health care provider approves.  Wear a good support bra to prevent discomfort from breast tenderness.  If you develop varicose veins: ? Wear support pantyhose or compression stockings as told by your healthcare provider. ? Elevate your feet for 15 minutes, 3-4 times a day. Prenatal care  Write down your questions. Take them to your prenatal visits.  Keep all your prenatal visits as told by your health care provider. This is important. Safety  Wear your seat belt at all times when driving.  Make  a list of emergency phone numbers, including numbers for family, friends, the hospital, and police and fire departments. General instructions  Avoid cat litter boxes and soil used by cats. These carry germs that can cause birth defects in the baby. If you have a cat, ask someone to clean the litter box for you.  Do not travel far distances unless it is absolutely necessary and only with the approval of your health care provider.  Do not use hot tubs, steam rooms, or saunas.  Do not drink alcohol.  Do not use any products that contain nicotine or tobacco, such as cigarettes and e-cigarettes. If you need help quitting, ask your health care provider.  Do not use any medicinal herbs or unprescribed drugs. These chemicals affect the formation and growth of the baby.  Do not douche or use tampons or scented sanitary pads.  Do not cross your legs for long periods of time.  To prepare for the arrival of your baby: ? Take prenatal classes to understand, practice, and ask questions about labor and delivery. ? Make a trial run to the hospital. ? Visit the hospital and tour the maternity area. ? Arrange for maternity or paternity leave through employers. ? Arrange for family and friends to take care of pets while you are in the hospital. ? Purchase a rear-facing car seat and make sure you know how to install it in your car. ? Pack your hospital bag. ? Prepare the baby's nursery. Make sure to remove all pillows and stuffed animals from the baby's crib to prevent suffocation.  Visit your dentist if you have not gone during your pregnancy. Use a soft toothbrush to brush your teeth and be gentle when you floss. Contact a health care provider if:  You are unsure if you are in labor or if your water has broken.  You become dizzy.  You have mild pelvic cramps, pelvic pressure, or nagging pain in your abdominal area.  You have lower back pain.  You have persistent nausea, vomiting, or  diarrhea.  You have an unusual or bad smelling vaginal discharge.  You have pain when you urinate. Get help right away if:  Your water breaks before 37 weeks.  You have regular contractions less than 5 minutes apart before 37 weeks.  You have a fever.  You are leaking fluid from your vagina.  You have spotting or bleeding from your vagina.  You have severe abdominal pain or cramping.  You have rapid weight loss or weight gain.  You have   shortness of breath with chest pain.  You notice sudden or extreme swelling of your face, hands, ankles, feet, or legs.  Your baby makes fewer than 10 movements in 2 hours.  You have severe headaches that do not go away when you take medicine.  You have vision changes. Summary  The third trimester is from week 28 through week 40, months 7 through 9. The third trimester is a time when the unborn baby (fetus) is growing rapidly.  During the third trimester, your discomfort may increase as you and your baby continue to gain weight. You may have abdominal, leg, and back pain, sleeping problems, and an increased need to urinate.  During the third trimester your breasts will keep growing and they will continue to become tender. A yellow fluid (colostrum) may leak from your breasts. This is the first milk you are producing for your baby.  False labor is a condition in which you feel small, irregular tightenings of the muscles in the womb (contractions) that eventually go away. These are called Braxton Hicks contractions. Contractions may last for hours, days, or even weeks before true labor sets in.  Signs of labor can include: abdominal cramps; regular contractions that start at 10 minutes apart and become stronger and more frequent with time; watery or bloody mucus discharge that comes from the vagina; increased pelvic pressure and dull back pain; and leaking of amniotic fluid. This information is not intended to replace advice given to you by your  health care provider. Make sure you discuss any questions you have with your health care provider. Document Revised: 03/07/2019 Document Reviewed: 12/20/2016 Elsevier Patient Education  2020 [employer]  

## 2019-12-30 ENCOUNTER — Encounter: Payer: Self-pay | Admitting: Obstetrics and Gynecology

## 2019-12-30 ENCOUNTER — Ambulatory Visit (INDEPENDENT_AMBULATORY_CARE_PROVIDER_SITE_OTHER): Payer: Medicaid Other | Admitting: Obstetrics and Gynecology

## 2019-12-30 VITALS — BP 107/69 | HR 90 | Wt 208.0 lb

## 2019-12-30 DIAGNOSIS — O09293 Supervision of pregnancy with other poor reproductive or obstetric history, third trimester: Secondary | ICD-10-CM

## 2019-12-30 DIAGNOSIS — O09893 Supervision of other high risk pregnancies, third trimester: Secondary | ICD-10-CM

## 2019-12-30 DIAGNOSIS — O09899 Supervision of other high risk pregnancies, unspecified trimester: Secondary | ICD-10-CM

## 2019-12-30 DIAGNOSIS — R768 Other specified abnormal immunological findings in serum: Secondary | ICD-10-CM

## 2019-12-30 DIAGNOSIS — Z8751 Personal history of pre-term labor: Secondary | ICD-10-CM

## 2019-12-30 DIAGNOSIS — Z3A34 34 weeks gestation of pregnancy: Secondary | ICD-10-CM

## 2019-12-30 NOTE — Progress Notes (Signed)
Subjective:  Adriana [name] is a 24 y.o. G3P1101 at [redacted]w[redacted]d being seen today for ongoing prenatal care.  She is currently monitored for the following issues for this high-risk pregnancy and has Migraine headache with aura; Prior pregnancy with fetal demise and current pregnancy; HSV-2 seropositive; Supervision of other high risk pregnancy, antepartum; History of preterm delivery; and Late prenatal care affecting pregnancy in second trimester on their problem list.  Patient reports occ BH.  Contractions: Irregular. Vag. Bleeding: None.  Movement: Present. Denies leaking of fluid.   The following portions of the patient's history were reviewed and updated as appropriate: allergies, current medications, past family history, past medical history, past social history, past surgical history and problem list. Problem list updated.  Objective:   Vitals:   12/30/19 0900  BP: 107/69  Pulse: 90  Weight: 208 lb (94.3 kg)    Fetal Status: Fetal Heart Rate (bpm): 140   Movement: Present     General:  Alert, oriented and cooperative. Patient is in no acute distress.  Skin: Skin is warm and dry. No rash noted.   Cardiovascular: Normal heart rate noted  Respiratory: Normal respiratory effort, no problems with respiration noted  Abdomen: Soft, gravid, appropriate for gestational age. Pain/Pressure: Present     Pelvic:  Cervical exam deferred        Extremities: Normal range of motion.  Edema: Trace  Mental Status: Normal mood and affect. Normal behavior. Normal judgment and thought content.   Urinalysis:      Assessment and Plan:  Pregnancy: G3P1101 at [redacted]w[redacted]d  1. Supervision of other high risk pregnancy, antepartum Stable GBS next visit  2. History of preterm delivery Stable Declined 17 OHP   3. HSV-2 seropositive Start Valtrex at next visit  4. Prior fetal demise Continue with twice weekly (Tuesday/Friday) antenatal testing BPP tomorrow  Preterm labor symptoms and general obstetric  precautions including but not limited to vaginal bleeding, contractions, leaking of fluid and fetal movement were reviewed in detail with the patient. Please refer to After Visit Summary for other counseling recommendations.  Return in about 2 weeks (around 01/13/2020) for OB visit, face to face, GBS, MD provider.   Hermina Staggers, MD

## 2019-12-30 NOTE — Patient Instructions (Signed)
Braxton Hicks Contractions Contractions of the uterus can occur throughout pregnancy, but they are not always a sign that you are in labor. You may have practice contractions called Braxton Hicks contractions. These false labor contractions are sometimes confused with true labor. What are Braxton Hicks contractions? Braxton Hicks contractions are tightening movements that occur in the muscles of the uterus before labor. Unlike true labor contractions, these contractions do not result in opening (dilation) and thinning of the cervix. Toward the end of pregnancy (32-34 weeks), Braxton Hicks contractions can happen more often and may become stronger. These contractions are sometimes difficult to tell apart from true labor because they can be very uncomfortable. You should not feel embarrassed if you go to the hospital with false labor. Sometimes, the only way to tell if you are in true labor is for your health care provider to look for changes in the cervix. The health care provider will do a physical exam and may monitor your contractions. If you are not in true labor, the exam should show that your cervix is not dilating and your water has not broken. If there are no other health problems associated with your pregnancy, it is completely safe for you to be sent home with false labor. You may continue to have Braxton Hicks contractions until you go into true labor. How to tell the difference between true labor and false labor True labor  Contractions last 30-70 seconds.  Contractions become very regular.  Discomfort is usually felt in the top of the uterus, and it spreads to the lower abdomen and low back.  Contractions do not go away with walking.  Contractions usually become more intense and increase in frequency.  The cervix dilates and gets thinner. False labor  Contractions are usually shorter and not as strong as true labor contractions.  Contractions are usually irregular.  Contractions  are often felt in the front of the lower abdomen and in the groin.  Contractions may go away when you walk around or change positions while lying down.  Contractions get weaker and are shorter-lasting as time goes on.  The cervix usually does not dilate or become thin. Follow these instructions at home:   Take over-the-counter and prescription medicines only as told by your health care provider.  Keep up with your usual exercises and follow other instructions from your health care provider.  Eat and drink lightly if you think you are going into labor.  If Braxton Hicks contractions are making you uncomfortable: ? Change your position from lying down or resting to walking, or change from walking to resting. ? Sit and rest in a tub of warm water. ? Drink enough fluid to keep your urine pale yellow. Dehydration may cause these contractions. ? Do slow and deep breathing several times an hour.  Keep all follow-up prenatal visits as told by your health care provider. This is important. Contact a health care provider if:  You have a fever.  You have continuous pain in your abdomen. Get help right away if:  Your contractions become stronger, more regular, and closer together.  You have fluid leaking or gushing from your vagina.  You pass blood-tinged mucus (bloody show).  You have bleeding from your vagina.  You have low back pain that you never had before.  You feel your baby's head pushing down and causing pelvic pressure.  Your baby is not moving inside you as much as it used to. Summary  Contractions that occur before labor are   called Braxton Hicks contractions, false labor, or practice contractions.  Braxton Hicks contractions are usually shorter, weaker, farther apart, and less regular than true labor contractions. True labor contractions usually become progressively stronger and regular, and they become more frequent.  Manage discomfort from [facility] contractions  by changing position, resting in a warm bath, drinking plenty of water, or practicing deep breathing. This information is not intended to replace advice given to you by your health care provider. Make sure you discuss any questions you have with your health care provider. Document Revised: 10/27/2017 Document Reviewed: 03/30/2017 Elsevier Patient Education  [place] of Pregnancy The third trimester is from week 28 through week 40 (months 7 through 9). The third trimester is a time when the unborn baby (fetus) is growing rapidly. At the end of the ninth month, the fetus is about 20 inches in length and weighs 6-10 pounds. Body changes during your third trimester Your body will continue to go through many changes during pregnancy. The changes vary from woman to woman. During the third trimester:  Your weight will continue to increase. You can expect to gain 25-35 pounds (11-16 kg) by the end of the pregnancy.  You may begin to get stretch marks on your hips, abdomen, and breasts.  You may urinate more often because the fetus is moving lower into your pelvis and pressing on your bladder.  You may develop or continue to have heartburn. This is caused by increased hormones that slow down muscles in the digestive tract.  You may develop or continue to have constipation because increased hormones slow digestion and cause the muscles that push waste through your intestines to relax.  You may develop hemorrhoids. These are swollen veins (varicose veins) in the rectum that can itch or be painful.  You may develop swollen, bulging veins (varicose veins) in your legs.  You may have increased body aches in the pelvis, back, or thighs. This is due to weight gain and increased hormones that are relaxing your joints.  You may have changes in your hair. These can include thickening of your hair, rapid growth, and changes in texture. Some women also have hair loss during or after  pregnancy, or hair that feels dry or thin. Your hair will most likely return to normal after your baby is born.  Your breasts will continue to grow and they will continue to become tender. A yellow fluid (colostrum) may leak from your breasts. This is the first milk you are producing for your baby.  Your belly button may stick out.  You may notice more swelling in your hands, face, or ankles.  You may have increased tingling or numbness in your hands, arms, and legs. The skin on your belly may also feel numb.  You may feel short of breath because of your expanding uterus.  You may have more problems sleeping. This can be caused by the size of your belly, increased need to urinate, and an increase in your body's metabolism.  You may notice the fetus "dropping," or moving lower in your abdomen (lightening).  You may have increased vaginal discharge.  You may notice your joints feel loose and you may have pain around your pelvic bone. What to expect at prenatal visits You will have prenatal exams every 2 weeks until week 36. Then you will have weekly prenatal exams. During a routine prenatal visit:  You will be weighed to make sure you and the baby are growing normally.  Your blood pressure will be taken.  Your abdomen will be measured to track your baby's growth.  The fetal heartbeat will be listened to.  Any test results from the previous visit will be discussed.  You may have a cervical check near your due date to see if your cervix has softened or thinned (effaced).  You will be tested for Group B streptococcus. This happens between 35 and 37 weeks. Your health care provider may ask you:  What your birth plan is.  How you are feeling.  If you are feeling the baby move.  If you have had any abnormal symptoms, such as leaking fluid, bleeding, severe headaches, or abdominal cramping.  If you are using any tobacco products, including cigarettes, chewing tobacco, and  electronic cigarettes.  If you have any questions. Other tests or screenings that may be performed during your third trimester include:  Blood tests that check for low iron levels (anemia).  Fetal testing to check the health, activity level, and growth of the fetus. Testing is done if you have certain medical conditions or if there are problems during the pregnancy.  Nonstress test (NST). This test checks the health of your baby to make sure there are no signs of problems, such as the baby not getting enough oxygen. During this test, a belt is placed around your belly. The baby is made to move, and its heart rate is monitored during movement. What is false labor? False labor is a condition in which you feel small, irregular tightenings of the muscles in the womb (contractions) that usually go away with rest, changing position, or drinking water. These are called Braxton Hicks contractions. Contractions may last for hours, days, or even weeks before true labor sets in. If contractions come at regular intervals, become more frequent, increase in intensity, or become painful, you should see your health care provider. What are the signs of labor?  Abdominal cramps.  Regular contractions that start at 10 minutes apart and become stronger and more frequent with time.  Contractions that start on the top of the uterus and spread down to the lower abdomen and back.  Increased pelvic pressure and dull back pain.  A watery or bloody mucus discharge that comes from the vagina.  Leaking of amniotic fluid. This is also known as your "water breaking." It could be a slow trickle or a gush. Let your health care provider know if it has a color or strange odor. If you have any of these signs, call your health care provider right away, even if it is before your due date. Follow these instructions at home: Medicines  Follow your health care provider's instructions regarding medicine use. Specific medicines  may be either safe or unsafe to take during pregnancy.  Take a prenatal vitamin that contains at least 600 micrograms (mcg) of folic acid.  If you develop constipation, try taking a stool softener if your health care provider approves. Eating and drinking   Eat a balanced diet that includes fresh fruits and vegetables, whole grains, good sources of protein such as meat, eggs, or tofu, and low-fat dairy. Your health care provider will help you determine the amount of weight gain that is right for you.  Avoid raw meat and uncooked cheese. These carry germs that can cause birth defects in the baby.  If you have low calcium intake from food, talk to your health care provider about whether you should take a daily calcium supplement.  Eat four or five  small meals rather than three large meals a day.  Limit foods that are high in fat and processed sugars, such as fried and sweet foods.  To prevent constipation: ? Drink enough fluid to keep your urine clear or pale yellow. ? Eat foods that are high in fiber, such as fresh fruits and vegetables, whole grains, and beans. Activity  Exercise only as directed by your health care provider. Most women can continue their usual exercise routine during pregnancy. Try to exercise for 30 minutes at least 5 days a week. Stop exercising if you experience uterine contractions.  Avoid heavy lifting.  Do not exercise in extreme heat or humidity, or at high altitudes.  Wear low-heel, comfortable shoes.  Practice good posture.  You may continue to have sex unless your health care provider tells you otherwise. Relieving pain and discomfort  Take frequent breaks and rest with your legs elevated if you have leg cramps or low back pain.  Take warm sitz baths to soothe any pain or discomfort caused by hemorrhoids. Use hemorrhoid cream if your health care provider approves.  Wear a good support bra to prevent discomfort from breast tenderness.  If you  develop varicose veins: ? Wear support pantyhose or compression stockings as told by your healthcare provider. ? Elevate your feet for 15 minutes, 3-4 times a day. Prenatal care  Write down your questions. Take them to your prenatal visits.  Keep all your prenatal visits as told by your health care provider. This is important. Safety  Wear your seat belt at all times when driving.  Make a list of emergency phone numbers, including numbers for family, friends, the hospital, and police and fire departments. General instructions  Avoid cat litter boxes and soil used by cats. These carry germs that can cause birth defects in the baby. If you have a cat, ask someone to clean the litter box for you.  Do not travel far distances unless it is absolutely necessary and only with the approval of your health care provider.  Do not use hot tubs, steam rooms, or saunas.  Do not drink alcohol.  Do not use any products that contain nicotine or tobacco, such as cigarettes and e-cigarettes. If you need help quitting, ask your health care provider.  Do not use any medicinal herbs or unprescribed drugs. These chemicals affect the formation and growth of the baby.  Do not douche or use tampons or scented sanitary pads.  Do not cross your legs for long periods of time.  To prepare for the arrival of your baby: ? Take prenatal classes to understand, practice, and ask questions about labor and delivery. ? Make a trial run to the hospital. ? Visit the hospital and tour the maternity area. ? Arrange for maternity or paternity leave through employers. ? Arrange for family and friends to take care of pets while you are in the hospital. ? Purchase a rear-facing car seat and make sure you know how to install it in your car. ? Pack your hospital bag. ? Prepare the baby's nursery. Make sure to remove all pillows and stuffed animals from the baby's crib to prevent suffocation.  Visit your dentist if you have  not gone during your pregnancy. Use a soft toothbrush to brush your teeth and be gentle when you floss. Contact a health care provider if:  You are unsure if you are in labor or if your water has broken.  You become dizzy.  You have mild pelvic cramps, pelvic  pressure, or nagging pain in your abdominal area.  You have lower back pain.  You have persistent nausea, vomiting, or diarrhea.  You have an unusual or bad smelling vaginal discharge.  You have pain when you urinate. Get help right away if:  Your water breaks before 37 weeks.  You have regular contractions less than 5 minutes apart before 37 weeks.  You have a fever.  You are leaking fluid from your vagina.  You have spotting or bleeding from your vagina.  You have severe abdominal pain or cramping.  You have rapid weight loss or weight gain.  You have shortness of breath with chest pain.  You notice sudden or extreme swelling of your face, hands, ankles, feet, or legs.  Your baby makes fewer than 10 movements in 2 hours.  You have severe headaches that do not go away when you take medicine.  You have vision changes. Summary  The third trimester is from week 28 through week 40, months 7 through 9. The third trimester is a time when the unborn baby (fetus) is growing rapidly.  During the third trimester, your discomfort may increase as you and your baby continue to gain weight. You may have abdominal, leg, and back pain, sleeping problems, and an increased need to urinate.  During the third trimester your breasts will keep growing and they will continue to become tender. A yellow fluid (colostrum) may leak from your breasts. This is the first milk you are producing for your baby.  False labor is a condition in which you feel small, irregular tightenings of the muscles in the womb (contractions) that eventually go away. These are called Braxton Hicks contractions. Contractions may last for hours, days, or even  weeks before true labor sets in.  Signs of labor can include: abdominal cramps; regular contractions that start at 10 minutes apart and become stronger and more frequent with time; watery or bloody mucus discharge that comes from the vagina; increased pelvic pressure and dull back pain; and leaking of amniotic fluid. This information is not intended to replace advice given to you by your health care provider. Make sure you discuss any questions you have with your health care provider. Document Revised: 03/07/2019 Document Reviewed: 12/20/2016 Elsevier Patient Education  2020 [employer].

## 2019-12-31 ENCOUNTER — Other Ambulatory Visit (HOSPITAL_COMMUNITY): Payer: Self-pay | Admitting: *Deleted

## 2019-12-31 ENCOUNTER — Other Ambulatory Visit: Payer: Self-pay

## 2019-12-31 ENCOUNTER — Ambulatory Visit (HOSPITAL_COMMUNITY): Payer: Medicaid Other | Admitting: *Deleted

## 2019-12-31 ENCOUNTER — Encounter (HOSPITAL_COMMUNITY): Payer: Self-pay

## 2019-12-31 ENCOUNTER — Ambulatory Visit (HOSPITAL_COMMUNITY)
Admission: RE | Admit: 2019-12-31 | Discharge: 2019-12-31 | Disposition: A | Discharge status: Home / Self Care | Payer: Medicaid Other | Source: Ambulatory Visit | Attending: Maternal & Fetal Medicine | Admitting: Maternal & Fetal Medicine

## 2019-12-31 DIAGNOSIS — O09293 Supervision of pregnancy with other poor reproductive or obstetric history, third trimester: Secondary | ICD-10-CM | POA: Diagnosis present

## 2019-12-31 DIAGNOSIS — O09899 Supervision of other high risk pregnancies, unspecified trimester: Secondary | ICD-10-CM

## 2019-12-31 DIAGNOSIS — O0932 Supervision of pregnancy with insufficient antenatal care, second trimester: Secondary | ICD-10-CM | POA: Diagnosis present

## 2019-12-31 DIAGNOSIS — O09213 Supervision of pregnancy with history of pre-term labor, third trimester: Secondary | ICD-10-CM

## 2019-12-31 DIAGNOSIS — Z3A34 34 weeks gestation of pregnancy: Secondary | ICD-10-CM

## 2020-01-03 ENCOUNTER — Ambulatory Visit: Payer: Medicaid Other

## 2020-01-07 ENCOUNTER — Ambulatory Visit (HOSPITAL_COMMUNITY)
Admission: RE | Admit: 2020-01-07 | Discharge: 2020-01-07 | Disposition: A | Discharge status: Home / Self Care | Payer: Medicaid Other | Source: Ambulatory Visit | Attending: Obstetrics and Gynecology | Admitting: Obstetrics and Gynecology

## 2020-01-07 ENCOUNTER — Other Ambulatory Visit (HOSPITAL_COMMUNITY): Payer: Self-pay | Admitting: Obstetrics

## 2020-01-07 ENCOUNTER — Other Ambulatory Visit: Payer: Self-pay

## 2020-01-07 ENCOUNTER — Encounter (HOSPITAL_COMMUNITY): Payer: Self-pay

## 2020-01-07 ENCOUNTER — Ambulatory Visit (HOSPITAL_COMMUNITY): Payer: Medicaid Other | Admitting: *Deleted

## 2020-01-07 DIAGNOSIS — O09213 Supervision of pregnancy with history of pre-term labor, third trimester: Secondary | ICD-10-CM | POA: Diagnosis not present

## 2020-01-07 DIAGNOSIS — O0932 Supervision of pregnancy with insufficient antenatal care, second trimester: Secondary | ICD-10-CM | POA: Diagnosis present

## 2020-01-07 DIAGNOSIS — O09899 Supervision of other high risk pregnancies, unspecified trimester: Secondary | ICD-10-CM | POA: Diagnosis present

## 2020-01-07 DIAGNOSIS — O09293 Supervision of pregnancy with other poor reproductive or obstetric history, third trimester: Secondary | ICD-10-CM

## 2020-01-07 DIAGNOSIS — O42913 Preterm premature rupture of membranes, unspecified as to length of time between rupture and onset of labor, third trimester: Secondary | ICD-10-CM

## 2020-01-07 DIAGNOSIS — Z3A35 35 weeks gestation of pregnancy: Secondary | ICD-10-CM

## 2020-01-09 ENCOUNTER — Ambulatory Visit (INDEPENDENT_AMBULATORY_CARE_PROVIDER_SITE_OTHER): Payer: Medicaid Other

## 2020-01-09 ENCOUNTER — Other Ambulatory Visit: Payer: Self-pay

## 2020-01-09 VITALS — BP 124/71 | HR 99

## 2020-01-09 DIAGNOSIS — O09293 Supervision of pregnancy with other poor reproductive or obstetric history, third trimester: Secondary | ICD-10-CM | POA: Diagnosis not present

## 2020-01-09 DIAGNOSIS — O09899 Supervision of other high risk pregnancies, unspecified trimester: Secondary | ICD-10-CM

## 2020-01-09 DIAGNOSIS — O0932 Supervision of pregnancy with insufficient antenatal care, second trimester: Secondary | ICD-10-CM

## 2020-01-09 NOTE — Progress Notes (Signed)
Pt presents for NST for previous IUFD. Pt has no concerns today. NST reviewed by Dr. Jolayne Panther.

## 2020-01-09 NOTE — Progress Notes (Signed)
Patient seen and assessed by nursing staff during this encounter. I have reviewed the chart and agree with the documentation and plan. NST reviewed and reactive   Catalina Antigua, MD 01/09/2020 10:45 AM

## 2020-01-11 ENCOUNTER — Inpatient Hospital Stay (HOSPITAL_COMMUNITY)
Admission: AD | Admit: 2020-01-11 | Discharge: 2020-01-12 | Disposition: A | Discharge status: Home / Self Care | Payer: Medicaid Other | Attending: Obstetrics and Gynecology | Admitting: Obstetrics and Gynecology

## 2020-01-11 ENCOUNTER — Encounter (HOSPITAL_COMMUNITY): Payer: Self-pay | Admitting: Obstetrics and Gynecology

## 2020-01-11 ENCOUNTER — Other Ambulatory Visit: Payer: Self-pay

## 2020-01-11 DIAGNOSIS — R102 Pelvic and perineal pain: Secondary | ICD-10-CM | POA: Diagnosis not present

## 2020-01-11 DIAGNOSIS — Z3689 Encounter for other specified antenatal screening: Secondary | ICD-10-CM

## 2020-01-11 DIAGNOSIS — Z3A36 36 weeks gestation of pregnancy: Secondary | ICD-10-CM | POA: Diagnosis not present

## 2020-01-11 DIAGNOSIS — Z7982 Long term (current) use of aspirin: Secondary | ICD-10-CM | POA: Diagnosis not present

## 2020-01-11 DIAGNOSIS — R109 Unspecified abdominal pain: Secondary | ICD-10-CM | POA: Diagnosis not present

## 2020-01-11 DIAGNOSIS — Z79899 Other long term (current) drug therapy: Secondary | ICD-10-CM | POA: Insufficient documentation

## 2020-01-11 DIAGNOSIS — M545 Low back pain: Secondary | ICD-10-CM | POA: Insufficient documentation

## 2020-01-11 DIAGNOSIS — N898 Other specified noninflammatory disorders of vagina: Secondary | ICD-10-CM | POA: Insufficient documentation

## 2020-01-11 DIAGNOSIS — O26893 Other specified pregnancy related conditions, third trimester: Secondary | ICD-10-CM | POA: Insufficient documentation

## 2020-01-11 DIAGNOSIS — Z0371 Encounter for suspected problem with amniotic cavity and membrane ruled out: Secondary | ICD-10-CM

## 2020-01-11 DIAGNOSIS — Z711 Person with feared health complaint in whom no diagnosis is made: Secondary | ICD-10-CM

## 2020-01-11 DIAGNOSIS — Z87891 Personal history of nicotine dependence: Secondary | ICD-10-CM | POA: Diagnosis not present

## 2020-01-11 LAB — URINALYSIS, ROUTINE W REFLEX MICROSCOPIC
Bilirubin Urine: NEGATIVE
Glucose, UA: NEGATIVE mg/dL
Hgb urine dipstick: NEGATIVE
Ketones, ur: NEGATIVE mg/dL
Nitrite: NEGATIVE
Protein, ur: NEGATIVE mg/dL
Specific Gravity, Urine: 1.004 — ABNORMAL LOW (ref 1.005–1.030)
pH: 6 (ref 5.0–8.0)

## 2020-01-11 NOTE — MAU Note (Signed)
..  Adriana [name] is a 24 y.o. at [redacted]w[redacted]d here in MAU reporting: Increased fetal movement. Pt states she has felt irregular cramping and has watery vaginal discharge.

## 2020-01-11 NOTE — MAU Provider Note (Signed)
History     CSN: [id number]  Arrival date and time: 01/11/20 2142   First Provider Initiated Contact with Patient 01/11/20 2243      Chief Complaint  Patient presents with  . Abdominal Cramping  . Vaginal Discharge   Adriana [name] is a 24 y.o. G3P1101 at 43w1dwho receives care at [facility].  She presents today for Abdominal Cramping and Vaginal Discharge.  She states she started having leaking yesterday or Thursday that is "a little trickle that is clear fluid."  Patient is not sure if she is urinating on herself and states "I see it in my panties sometimes, but it doesn't look like my normal discharge."  Patient denies sexual activity in the past 3 days or notable discharge prior to the leakage.  She denies vaginal bleeding and reports BH contractions.  Patient also reports increased fetal movement and expresses concern about that. She states she has made no modifications in her diet.  Patient states she hasn't counted movements, but she states she "has never really felt him move like this."  She states she is not really concerned, but her BF was worried about "the cord being wrapped around his neck, but I told him that if he has enough fluid he will be alright."    Eggs and sausage Salad and stuffed shells [employer] Sal[employer] Water, Tea-Sweet Coffee, Pepsi     OB History    Gravida  3   Para  2   Term  1   Preterm  1   AB      Living  1     SAB      TAB      Ectopic      Multiple  0   Live Births  1           Past Medical History:  Diagnosis Date  . Migraines     Past Surgical History:  Procedure Laterality Date  . HERNIA REPAIR     as an infant    History reviewed. No pertinent family history.  Social History   Tobacco Use  . Smoking status: Former Smoker    Quit date: 06/30/2016    Years since quitting: 3.5  . Smokeless tobacco: Never Used  Substance Use Topics  . Alcohol use: No    Alcohol/week: 0.0 standard drinks  . Drug use: No     Allergies:  Allergies  Allergen Reactions  . Imitrex [Sumatriptan] Other (See Comments)    Chest pain, heaviness, migraine  . Latex Rash    Medications Prior to Admission  Medication Sig Dispense Refill Last Dose  . Fe Fum-FePoly-Vit C-Vit B3 (INTEGRA) 62.5-62.5-40-3 MG CAPS Take 1 capsule by mouth daily. 30 capsule 2 Past Week at Unknown time  . ferrous sulfate 325 (65 FE) MG EC tablet Take 325 mg by mouth 3 (three) times daily with meals.   Past Week at Unknown time  . Prenatal MV-Min-FA-Omega-3 (PRENATAL GUMMIES/DHA & FA) 0.4-32.5 MG CHEW Chew by mouth.   01/11/2020 at Unknown time  . aspirin EC 81 MG tablet Take 1 tablet (81 mg total) by mouth daily. (Patient not taking: Reported on 09/10/2019) 30 tablet 11   . Blood Pressure Monitoring (BLOOD PRESSURE KIT) DEVI 1 kit by Does not apply route once a week. Check BP Weekly.  Large Cuff.  Dx:Z13.6  Z34.86  O09.0 (Patient not taking: Reported on 12/16/2019) 1 kit 0   . docusate sodium (COLACE) 100 MG capsule Take 1 capsule (100 mg  total) by mouth 2 (two) times daily as needed. (Patient not taking: Reported on 11/19/2019) 30 capsule 2   . Elastic Bandages & Supports (COMFORT FIT MATERNITY SUPP MED) MISC 1 Device by Does not apply route daily. 1 each 0   . famotidine (PEPCID) 20 MG tablet Take 1 tablet (20 mg total) by mouth daily. 30 tablet 0   . metoCLOPramide (REGLAN) 10 MG tablet Take 1 tablet (10 mg total) by mouth every 6 (six) hours. 30 tablet 0  at Not taking  . polyethylene glycol powder (GLYCOLAX/MIRALAX) 17 GM/SCOOP powder Take 17 g by mouth daily. (Patient not taking: Reported on 11/19/2019) 255 g 0   . promethazine (PHENERGAN) 25 MG tablet TAKE 1 TABLET(25 MG) BY MOUTH EVERY 6 HOURS AS NEEDED FOR NAUSEA OR VOMITING (Patient not taking: Reported on 09/10/2019) 30 tablet 1     Review of Systems  Constitutional: Negative for chills and fever.  Respiratory: Negative for cough and shortness of breath.   Gastrointestinal: Positive  for nausea. Negative for abdominal pain and vomiting.  Genitourinary: Positive for dysuria and pelvic pain ("Menstrual cycle-type cramps"). Negative for difficulty urinating, vaginal bleeding and vaginal discharge.  Musculoskeletal: Positive for back pain (Lower back that "comes and goes and may be due to the [facility]  Neurological: Negative for dizziness and light-headedness.   Physical Exam   Blood pressure 114/69, pulse (!) 102, temperature 98.4 F (36.9 C), temperature source Oral, resp. rate 16, last menstrual period 05/03/2019, SpO2 100 %, currently breastfeeding.  Physical Exam  Constitutional: She is oriented to person, place, and time. She appears well-developed and well-nourished. No distress.  HENT:  Head: Normocephalic and atraumatic.  Eyes: Conjunctivae are normal.  Cardiovascular: Normal rate.  Respiratory: Effort normal.  GI: Soft. There is no abdominal tenderness.  Genitourinary: Cervix exhibits no motion tenderness, no discharge and no friability.    Vaginal discharge present.     No vaginal bleeding.  No bleeding in the vagina.    Genitourinary Comments: Sterile Speculum Exam: -Normal External Genitalia: Non tender, no apparent discharge or fluid noted at introitus.  -Vaginal Vault: Pink mucosa with good rugae. Small amt thick white discharge, No pooling -wet prep collected -Cervix: Pink, no lesions, cysts, or polyps.  Appears closed. No active bleeding or leaking from os with valsalva maneuver-GC/CT collected -Bimanual Exam: Dilation: 1(Internal Os Closed, External 1cm) Effacement (%): Thick Cervical Position: Posterior Exam by:: J.Viraj Liby,cnm    Musculoskeletal:        General: Normal range of motion.     Cervical back: Normal range of motion.  Neurological: She is alert and oriented to person, place, and time.  Skin: Skin is warm and dry.  Psychiatric: She has a normal mood and affect. Her behavior is normal.    Fetal Assessment 150 bpm, Mod Var, -Decels,  +Accels Toco: None graphed  MAU Course   Results for orders placed or performed during the hospital encounter of 01/11/20 (from the past 24 hour(s))  Urinalysis, Routine w reflex microscopic     Status: Abnormal   Collection Time: 01/11/20 10:00 PM  Result Value Ref Range   Color, Urine STRAW (A) YELLOW   APPearance HAZY (A) CLEAR   Specific Gravity, Urine 1.004 (L) 1.005 - 1.030   pH 6.0 5.0 - 8.0   Glucose, UA NEGATIVE NEGATIVE mg/dL   Hgb urine dipstick NEGATIVE NEGATIVE   Bilirubin Urine NEGATIVE NEGATIVE   Ketones, ur NEGATIVE NEGATIVE mg/dL   Protein, ur NEGATIVE NEGATIVE mg/dL  Nitrite NEGATIVE NEGATIVE   Leukocytes,Ua TRACE (A) NEGATIVE   RBC / HPF 0-5 0 - 5 RBC/hpf   WBC, UA 0-5 0 - 5 WBC/hpf   Bacteria, UA RARE (A) NONE SEEN   Squamous Epithelial / LPF 6-10 0 - 5  Wet prep, genital     Status: Abnormal   Collection Time: 01/11/20 11:03 PM   Specimen: Vaginal  Result Value Ref Range   Yeast Wet Prep HPF POC NONE SEEN NONE SEEN   Trich, Wet Prep NONE SEEN NONE SEEN   Clue Cells Wet Prep HPF POC NONE SEEN NONE SEEN   WBC, Wet Prep HPF POC FEW (A) NONE SEEN   Sperm NONE SEEN    No results found.  MDM PE Labs: UA, Wet Prep, GC/CT EFM  Assessment and Plan  24 year old G3P1101  SIUP at 36.1weeks Cat I FT Vaginal Leakage  -POC Reviewed -Exam performed and findings discussed. -Cultures collected and pending.  -Patient reassured that fetal movement is expected and is reassuring and not indicative of distress unless it is decreased or absent.  -Fern performed and negative -Will await other results.  -NST Reactive  Maryann Conners MSN, CNM 01/11/2020, 10:43 PM   Reassessment (12:10 AM)  -Wet prep without significant findings. -GC/CT Pending. -Patient informed of results who expresses relief. -No questions or concerns. -Encouraged to call office or return to MAU if symptoms worsen or with the onset of new symptoms. -Discharged to home in stable  condition.  Maryann Conners MSN, CNM Advanced Practice Provider, [facility]

## 2020-01-12 LAB — WET PREP, GENITAL
Clue Cells Wet Prep HPF POC: NONE SEEN
Sperm: NONE SEEN
Trich, Wet Prep: NONE SEEN
Yeast Wet Prep HPF POC: NONE SEEN

## 2020-01-12 NOTE — Discharge Instructions (Signed)
Fetal Movement Counts Patient Name: ________________________________________________ Patient Due Date: ____________________ What is a fetal movement count?  A fetal movement count is the number of times that you feel your baby move during a certain amount of time. This may also be called a fetal kick count. A fetal movement count is recommended for every pregnant woman. You may be asked to start counting fetal movements as early as week 28 of your pregnancy. Pay attention to when your baby is most active. You may notice your baby's sleep and wake cycles. You may also notice things that make your baby move more. You should do a fetal movement count:  When your baby is normally most active.  At the same time each day. A good time to count movements is while you are resting, after having something to eat and drink. How do I count fetal movements? 1. Find a quiet, comfortable area. Sit, or lie down on your side. 2. Write down the date, the start time and stop time, and the number of movements that you felt between those two times. Take this information with you to your health care visits. 3. Write down your start time when you feel the first movement. 4. Count kicks, flutters, swishes, rolls, and jabs. You should feel at least 10 movements. 5. You may stop counting after you have felt 10 movements, or if you have been counting for 2 hours. Write down the stop time. 6. If you do not feel 10 movements in 2 hours, contact your health care provider for further instructions. Your health care provider may want to do additional tests to assess your baby's well-being. Contact a health care provider if:  You feel fewer than 10 movements in 2 hours.  Your baby is not moving like he or she usually does. Date: ____________ Start time: ____________ Stop time: ____________ Movements: ____________ Date: ____________ Start time: ____________ Stop time: ____________ Movements: ____________ Date: ____________  Start time: ____________ Stop time: ____________ Movements: ____________ Date: ____________ Start time: ____________ Stop time: ____________ Movements: ____________ Date: ____________ Start time: ____________ Stop time: ____________ Movements: ____________ Date: ____________ Start time: ____________ Stop time: ____________ Movements: ____________ Date: ____________ Start time: ____________ Stop time: ____________ Movements: ____________ Date: ____________ Start time: ____________ Stop time: ____________ Movements: ____________ Date: ____________ Start time: ____________ Stop time: ____________ Movements: ____________ This information is not intended to replace advice given to you by your health care provider. Make sure you discuss any questions you have with your health care provider. Document Revised: 07/04/2019 Document Reviewed: 07/04/2019 Elsevier Patient Education  2020 [employer]  

## 2020-01-13 LAB — GC/CHLAMYDIA PROBE AMP (~~LOC~~) NOT AT ARMC
Chlamydia: NEGATIVE
Comment: NEGATIVE
Comment: NORMAL
Neisseria Gonorrhea: NEGATIVE

## 2020-01-14 ENCOUNTER — Other Ambulatory Visit: Payer: Self-pay

## 2020-01-14 ENCOUNTER — Inpatient Hospital Stay (EMERGENCY_DEPARTMENT_HOSPITAL)
Admission: AD | Admit: 2020-01-14 | Discharge: 2020-01-14 | Disposition: A | Discharge status: Home / Self Care | Payer: Medicaid Other | Source: Home / Self Care | Attending: Family Medicine | Admitting: Family Medicine

## 2020-01-14 ENCOUNTER — Encounter (HOSPITAL_COMMUNITY): Payer: Self-pay | Admitting: *Deleted

## 2020-01-14 ENCOUNTER — Ambulatory Visit (HOSPITAL_COMMUNITY): Payer: Medicaid Other | Admitting: *Deleted

## 2020-01-14 ENCOUNTER — Ambulatory Visit (HOSPITAL_BASED_OUTPATIENT_CLINIC_OR_DEPARTMENT_OTHER)
Admission: RE | Admit: 2020-01-14 | Discharge: 2020-01-14 | Disposition: A | Discharge status: Home / Self Care | Payer: Medicaid Other | Source: Ambulatory Visit | Attending: Obstetrics | Admitting: Obstetrics

## 2020-01-14 ENCOUNTER — Encounter (HOSPITAL_COMMUNITY): Payer: Self-pay | Admitting: Family Medicine

## 2020-01-14 DIAGNOSIS — O0932 Supervision of pregnancy with insufficient antenatal care, second trimester: Secondary | ICD-10-CM

## 2020-01-14 DIAGNOSIS — O09293 Supervision of pregnancy with other poor reproductive or obstetric history, third trimester: Secondary | ICD-10-CM

## 2020-01-14 DIAGNOSIS — Z87891 Personal history of nicotine dependence: Secondary | ICD-10-CM | POA: Insufficient documentation

## 2020-01-14 DIAGNOSIS — O09899 Supervision of other high risk pregnancies, unspecified trimester: Secondary | ICD-10-CM | POA: Insufficient documentation

## 2020-01-14 DIAGNOSIS — Z3A36 36 weeks gestation of pregnancy: Secondary | ICD-10-CM

## 2020-01-14 DIAGNOSIS — Z362 Encounter for other antenatal screening follow-up: Secondary | ICD-10-CM

## 2020-01-14 DIAGNOSIS — Z7982 Long term (current) use of aspirin: Secondary | ICD-10-CM | POA: Insufficient documentation

## 2020-01-14 DIAGNOSIS — N898 Other specified noninflammatory disorders of vagina: Secondary | ICD-10-CM | POA: Insufficient documentation

## 2020-01-14 DIAGNOSIS — O26893 Other specified pregnancy related conditions, third trimester: Secondary | ICD-10-CM | POA: Insufficient documentation

## 2020-01-14 DIAGNOSIS — Z79899 Other long term (current) drug therapy: Secondary | ICD-10-CM | POA: Insufficient documentation

## 2020-01-14 LAB — AMNISURE RUPTURE OF MEMBRANE (ROM) NOT AT ARMC: Amnisure ROM: NEGATIVE

## 2020-01-14 NOTE — Discharge Instructions (Signed)

## 2020-01-14 NOTE — MAU Provider Note (Signed)
Chief Complaint:  Rupture of Membranes   First Provider Initiated Contact with Patient 01/14/20 2151     HPI: Adriana [name] is a 24 y.o. G3P1101 at 61w4dho presents to maternity admissions reporting vaginal discharge that seemed watery.  Happened once, making underwear wet.  Did not soak outer clothes.  . She reports good fetal movement, denies vaginal bleeding, vaginal itching/burning, urinary symptoms, h/a, dizziness, n/v, diarrhea, constipation or fever/chills.    Hx IUFD in March 2020, with abruption and anhydramnios (with no history of leaking).  Worried that her AFI changed from 18 to 16 this week.  There is a note from Dr Adriana [name]: IOL at [phone] to hx IUFD.   Vaginal Discharge The patient's primary symptoms include vaginal discharge. The patient's pertinent negatives include no genital itching, genital lesions, genital odor, pelvic pain or vaginal bleeding. This is a new problem. The current episode started today. The problem occurs intermittently. The problem has been unchanged. The patient is experiencing no pain. She is pregnant. Pertinent negatives include no abdominal pain, back pain, constipation, diarrhea, dysuria, fever, headaches, nausea or vomiting. The vaginal discharge was clear. There has been no bleeding. She has not been passing clots. She has not been passing tissue. Nothing aggravates the symptoms. She has tried nothing for the symptoms.   RN note: .ALucylle Foulkesis a 24y.o. at 317w4dere in MAU reporting: trickling of fluid since the last time she was seen here. Pt states she had an ultrasound today and "the baby's fluid pocket was 2cm less." +FM. Pt also reports occasional CTX. Pain score: 3/10  Past Medical History: Past Medical History:  Diagnosis Date  . Migraines     Past obstetric history: OB History  Gravida Para Term Preterm AB Living  _0 SAB TAB Ectopic Multiple Live Births        0 1    # Outcome Date GA Lbr Len/2nd Weight Sex Delivery  Anes PTL Lv  3 Current           2 Term 02/11/19 [id number] / 00:18 3000 g F Vag-Spont EPI  FD  1 Preterm 02/09/17 [id number] / 01:44 2915 g F Vag-Spont EPI  LIV     Birth Comments: WNL    Past Surgical History: Past Surgical History:  Procedure Laterality Date  . HERNIA REPAIR     as an infant    Family History: History reviewed. No pertinent family history.  Social History: Social History   Tobacco Use  . Smoking status: Former Smoker    Quit date: 06/30/2016    Years since quitting: 3.5  . Smokeless tobacco: Never Used  Substance Use Topics  . Alcohol use: No    Alcohol/week: 0.0 standard drinks  . Drug use: No    Allergies:  Allergies  Allergen Reactions  . Imitrex [Sumatriptan] Other (See Comments)    Chest pain, heaviness, migraine  . Latex Rash    Meds:  Medications Prior to Admission  Medication Sig Dispense Refill Last Dose  . acetaminophen (TYLENOL) 500 MG tablet Take 500 mg by mouth every 6 (six) hours as needed.   01/14/2020 at Unknown time  . Fe Fum-FePoly-Vit C-Vit B3 (INTEGRA) 62.5-62.5-40-3 MG CAPS Take 1 capsule by mouth daily. 30 capsule 2 Past Week at Unknown time  . Prenatal MV-Min-FA-Omega-3 (PRENATAL GUMMIES/DHA & FA) 0.4-32.5 MG CHEW Chew by mouth.   01/13/2020 at Unknown time  . aspirin EC 81 MG tablet  Take 1 tablet (81 mg total) by mouth daily. (Patient not taking: Reported on 09/10/2019) 30 tablet 11   . Blood Pressure Monitoring (BLOOD PRESSURE KIT) DEVI 1 kit by Does not apply route once a week. Check BP Weekly.  Large Cuff.  Dx:Z13.6  Z34.86  O09.0 (Patient not taking: Reported on 12/16/2019) 1 kit 0   . Elastic Bandages & Supports (COMFORT FIT MATERNITY SUPP MED) MISC 1 Device by Does not apply route daily. 1 each 0   . ferrous sulfate 325 (65 FE) MG EC tablet Take 325 mg by mouth 3 (three) times daily with meals.     . metoCLOPramide (REGLAN) 10 MG tablet Take 1 tablet (10 mg total) by mouth every 6 (six) hours. 30 tablet 0     I have  reviewed patient's Past Medical Hx, Surgical Hx, Family Hx, Social Hx, medications and allergies.   ROS:  Review of Systems  Constitutional: Negative for fever.  Gastrointestinal: Negative for abdominal pain, constipation, diarrhea, nausea and vomiting.  Genitourinary: Positive for vaginal discharge. Negative for dysuria and pelvic pain.  Musculoskeletal: Negative for back pain.  Neurological: Negative for headaches.   Other systems negative  Physical Exam  No data found. Constitutional: Well-developed, well-nourished female in no acute distress.  Cardiovascular: normal rate and rhythm Respiratory: normal effort, clear to auscultation bilaterally GI: Abd soft, non-tender, gravid appropriate for gestational age.   No rebound or guarding. MS: Extremities nontender, no edema, normal ROM Neurologic: Alert and oriented x 4.  GU: Neg CVAT.  PELVIC EXAM: Amnisure done to reassure patient.  She states that in March her pelvic exam was negative for ROM also but the baby had no fluid when born.  Is concerned we may miss PPROM.    FHT:  Baseline 135-140 , moderate variability, accelerations present, no decelerations Contractions:  Irregular     Labs: Results for orders placed or performed during the hospital encounter of 01/14/20 (from the past 24 hour(s))  Amnisure rupture of membrane (rom)not at [facility]     Status: None   Collection Time: 01/14/20 10:06 PM  Result Value Ref Range   Amnisure ROM NEGATIVE     A/Positive/-- (09/16 1718)  Imaging:  none  MAU Course/MDM: I have ordered amnisure and reviewed results are negative NST reviewed, reactive, category I  Treatments in MAU included EFM, amnisure.    Assessment: Single IUP at 50w5dVaginal discharge No evidence of PPROM Reactive fetal heart rate tracing  Plan: Discharge home Labor precautions and fetal kick counts Follow up in Office for prenatal visits   Encouraged to return here or to other Urgent Care/ED if she  develops worsening of symptoms, increase in pain, fever, or other concerning symptoms.   Pt stable at time of discharge.  Adriana FeinsteinCNM, MSN Certified Nurse-Midwife 01/14/2020 9:51 PM

## 2020-01-14 NOTE — MAU Note (Signed)
..  Adriana [name] is a 24 y.o. at [redacted]w[redacted]d here in MAU reporting: trickling of fluid since the last time she was seen here. Pt states she had an ultrasound today and "the baby's fluid pocket was 2cm less." +FM. Pt also reports occasional CTX.   Pain score: 3/10

## 2020-01-15 ENCOUNTER — Ambulatory Visit (HOSPITAL_COMMUNITY): Payer: Medicaid Other

## 2020-01-15 ENCOUNTER — Other Ambulatory Visit: Payer: Self-pay

## 2020-01-15 ENCOUNTER — Inpatient Hospital Stay (HOSPITAL_COMMUNITY)
Admission: AD | Admit: 2020-01-15 | Discharge: 2020-01-18 | DRG: 807 | Disposition: A | Discharge status: Home / Self Care | Payer: Medicaid Other | Attending: Obstetrics and Gynecology | Admitting: Obstetrics and Gynecology

## 2020-01-15 ENCOUNTER — Ambulatory Visit (INDEPENDENT_AMBULATORY_CARE_PROVIDER_SITE_OTHER): Payer: Medicaid Other | Admitting: Obstetrics

## 2020-01-15 ENCOUNTER — Encounter: Payer: Self-pay | Admitting: Obstetrics

## 2020-01-15 ENCOUNTER — Other Ambulatory Visit (HOSPITAL_COMMUNITY)
Admission: RE | Admit: 2020-01-15 | Discharge: 2020-01-15 | Disposition: A | Discharge status: Home / Self Care | Payer: Medicaid Other | Source: Ambulatory Visit | Attending: Obstetrics | Admitting: Obstetrics

## 2020-01-15 ENCOUNTER — Inpatient Hospital Stay (HOSPITAL_COMMUNITY): Payer: Medicaid Other

## 2020-01-15 ENCOUNTER — Encounter (HOSPITAL_COMMUNITY): Payer: Self-pay | Admitting: Obstetrics and Gynecology

## 2020-01-15 VITALS — BP 116/71 | HR 88 | Wt 208.0 lb

## 2020-01-15 DIAGNOSIS — N898 Other specified noninflammatory disorders of vagina: Secondary | ICD-10-CM | POA: Diagnosis not present

## 2020-01-15 DIAGNOSIS — O289 Unspecified abnormal findings on antenatal screening of mother: Secondary | ICD-10-CM

## 2020-01-15 DIAGNOSIS — O368130 Decreased fetal movements, third trimester, not applicable or unspecified: Secondary | ICD-10-CM | POA: Diagnosis present

## 2020-01-15 DIAGNOSIS — O0932 Supervision of pregnancy with insufficient antenatal care, second trimester: Secondary | ICD-10-CM | POA: Diagnosis not present

## 2020-01-15 DIAGNOSIS — Z3A36 36 weeks gestation of pregnancy: Secondary | ICD-10-CM | POA: Diagnosis not present

## 2020-01-15 DIAGNOSIS — O09293 Supervision of pregnancy with other poor reproductive or obstetric history, third trimester: Secondary | ICD-10-CM

## 2020-01-15 DIAGNOSIS — Z20822 Contact with and (suspected) exposure to covid-19: Secondary | ICD-10-CM | POA: Diagnosis present

## 2020-01-15 DIAGNOSIS — O09899 Supervision of other high risk pregnancies, unspecified trimester: Secondary | ICD-10-CM

## 2020-01-15 DIAGNOSIS — Z8751 Personal history of pre-term labor: Secondary | ICD-10-CM

## 2020-01-15 DIAGNOSIS — O09893 Supervision of other high risk pregnancies, third trimester: Secondary | ICD-10-CM

## 2020-01-15 DIAGNOSIS — Z8759 Personal history of other complications of pregnancy, childbirth and the puerperium: Secondary | ICD-10-CM

## 2020-01-15 DIAGNOSIS — O0933 Supervision of pregnancy with insufficient antenatal care, third trimester: Secondary | ICD-10-CM

## 2020-01-15 DIAGNOSIS — O368190 Decreased fetal movements, unspecified trimester, not applicable or unspecified: Secondary | ICD-10-CM | POA: Diagnosis present

## 2020-01-15 DIAGNOSIS — Z87891 Personal history of nicotine dependence: Secondary | ICD-10-CM

## 2020-01-15 DIAGNOSIS — R768 Other specified abnormal immunological findings in serum: Secondary | ICD-10-CM | POA: Diagnosis present

## 2020-01-15 DIAGNOSIS — O26893 Other specified pregnancy related conditions, third trimester: Secondary | ICD-10-CM | POA: Diagnosis not present

## 2020-01-15 LAB — CBC
HCT: 34 % — ABNORMAL LOW (ref 36.0–46.0)
Hemoglobin: 10.6 g/dL — ABNORMAL LOW (ref 12.0–15.0)
MCH: 31.3 pg (ref 26.0–34.0)
MCHC: 31.2 g/dL (ref 30.0–36.0)
MCV: 100.3 fL — ABNORMAL HIGH (ref 80.0–100.0)
Platelets: 255 10*3/uL (ref 150–400)
RBC: 3.39 MIL/uL — ABNORMAL LOW (ref 3.87–5.11)
RDW: 16.4 % — ABNORMAL HIGH (ref 11.5–15.5)
WBC: 6.5 10*3/uL (ref 4.0–10.5)
nRBC: 0 % (ref 0.0–0.2)

## 2020-01-15 LAB — RESPIRATORY PANEL BY RT PCR (FLU A&B, COVID)
Influenza A by PCR: NEGATIVE
Influenza B by PCR: NEGATIVE
SARS Coronavirus 2 by RT PCR: NEGATIVE

## 2020-01-15 LAB — TYPE AND SCREEN
ABO/RH(D): A POS
Antibody Screen: NEGATIVE

## 2020-01-15 MED ORDER — TERBUTALINE SULFATE 1 MG/ML IJ SOLN: 0.2500 mg | Freq: Once | INTRAMUSCULAR | Status: DC | PRN

## 2020-01-15 MED ORDER — SOD CITRATE-CITRIC ACID 500-334 MG/5ML PO SOLN: 30.0000 mL | ORAL | Status: DC | PRN

## 2020-01-15 MED ORDER — OXYTOCIN BOLUS FROM INFUSION
500.0000 mL | Freq: Once | INTRAVENOUS | Status: AC
  Administered 2020-01-16: 500 mL via INTRAVENOUS

## 2020-01-15 MED ORDER — FENTANYL CITRATE (PF) 100 MCG/2ML IJ SOLN
100.0000 ug | INTRAMUSCULAR | Status: DC | PRN
  Administered 2020-01-16 (×2): 100 ug via INTRAVENOUS
  Filled 2020-01-15 (×2): qty 2

## 2020-01-15 MED ORDER — SODIUM CHLORIDE 0.9 % IV SOLN
5.0000 10*6.[IU] | Freq: Once | INTRAVENOUS | Status: AC
  Administered 2020-01-15: 5 10*6.[IU] via INTRAVENOUS
  Filled 2020-01-15: qty 5

## 2020-01-15 MED ORDER — LIDOCAINE HCL (PF) 1 % IJ SOLN: 30.0000 mL | INTRAMUSCULAR | Status: DC | PRN

## 2020-01-15 MED ORDER — LACTATED RINGERS IV SOLN: INTRAVENOUS | Status: DC

## 2020-01-15 MED ORDER — OXYCODONE-ACETAMINOPHEN 5-325 MG PO TABS: 1.0000 | ORAL_TABLET | ORAL | Status: DC | PRN

## 2020-01-15 MED ORDER — OXYCODONE-ACETAMINOPHEN 5-325 MG PO TABS: 2.0000 | ORAL_TABLET | ORAL | Status: DC | PRN

## 2020-01-15 MED ORDER — BETAMETHASONE SOD PHOS & ACET 6 (3-3) MG/ML IJ SUSP
12.0000 mg | INTRAMUSCULAR | Status: DC
  Administered 2020-01-15: 12 mg via INTRAMUSCULAR
  Filled 2020-01-15: qty 2
  Filled 2020-01-15: qty 5

## 2020-01-15 MED ORDER — ONDANSETRON HCL 4 MG/2ML IJ SOLN
4.0000 mg | Freq: Four times a day (QID) | INTRAMUSCULAR | Status: DC | PRN
  Administered 2020-01-16: 05:00:00 4 mg via INTRAVENOUS
  Filled 2020-01-15: qty 2

## 2020-01-15 MED ORDER — LACTATED RINGERS IV SOLN: 500.0000 mL | INTRAVENOUS | Status: DC | PRN

## 2020-01-15 MED ORDER — MISOPROSTOL 25 MCG QUARTER TABLET
25.0000 ug | ORAL_TABLET | ORAL | Status: DC
  Administered 2020-01-15 – 2020-01-16 (×2): 25 ug via VAGINAL
  Filled 2020-01-15 (×2): qty 1

## 2020-01-15 MED ORDER — ACETAMINOPHEN 325 MG PO TABS: 650.0000 mg | ORAL_TABLET | ORAL | Status: DC | PRN

## 2020-01-15 MED ORDER — PENICILLIN G POT IN DEXTROSE 60000 UNIT/ML IV SOLN
3.0000 10*6.[IU] | INTRAVENOUS | Status: DC
  Administered 2020-01-16 (×4): 3 10*6.[IU] via INTRAVENOUS
  Filled 2020-01-15 (×5): qty 50

## 2020-01-15 MED ORDER — OXYTOCIN 40 UNITS IN NORMAL SALINE INFUSION - SIMPLE MED: 2.5000 [IU]/h | INTRAVENOUS | Status: DC

## 2020-01-15 NOTE — H&P (Addendum)
LABOR AND DELIVERY ADMISSION HISTORY AND PHYSICAL NOTE  Adriana [name] is a 24 y.o. female G3P1101 with IUP at 72w5dby LMP (EDD 02/07/2020) presenting for IOL for Decreased Fetal Movements, found to have BPP 6/8 with occasional flat strip on exam.  She reports positive fetal movement although decreased in frequency. She denies leakage of fluid or vaginal bleeding.  [facility]:  - Decreased Fetal Movement, Late Prenatal Care, HSV-2-Seropositive, History of Prior Pregnancy with IUFD at 3105weeks  Past Medical History: Past Medical History:  Diagnosis Date  . Migraines     Past Surgical History: Past Surgical History:  Procedure Laterality Date  . HERNIA REPAIR     as an infant    Obstetrical History: OB History    Gravida  3   Para  2   Term  1   Preterm  1   AB      Living  1     SAB      TAB      Ectopic      Multiple  0   Live Births  1           Social History: Social History   Socioeconomic History  . Marital status: Single    Spouse name: Not on file  . Number of children: 1  . Years of education: Not on file  . Highest education level: Not on file  Occupational History  . Occupation: Unemployed    Comment: non employeed  Tobacco Use  . Smoking status: Former Smoker    Quit date: 06/30/2016    Years since quitting: 3.5  . Smokeless tobacco: Never Used  Substance and Sexual Activity  . Alcohol use: No    Alcohol/week: 0.0 standard drinks  . Drug use: No  . Sexual activity: Not Currently    Birth control/protection: None  Other Topics Concern  . Not on file  Social History Narrative  . Not on file   Social Determinants of Health   Financial Resource Strain:   . Difficulty of Paying Living Expenses: Not on file  Food Insecurity:   . Worried About RCharity fundraiserin the Last Year: Not on file  . Ran Out of Food in the Last Year: Not on file  Transportation Needs:   .  Lack of Transportation (Medical): Not on file  . Lack of Transportation (Non-Medical): Not on file  Physical Activity:   . Days of Exercise per Week: Not on file  . Minutes of Exercise per Session: Not on file  Stress:   . Feeling of Stress : Not on file  Social Connections:   . Frequency of Communication with Friends and Family: Not on file  . Frequency of Social Gatherings with Friends and Family: Not on file  . Attends Religious Services: Not on file  . Active Member of Clubs or Organizations: Not on file  . Attends CArchivistMeetings: Not on file  . Marital Status: Not on file    Family History: History reviewed. No pertinent family history.  Allergies: Allergies  Allergen Reactions  . Imitrex [Sumatriptan] Other (See Comments)    Chest pain, heaviness, migraine  . Latex Rash    Medications Prior to Admission  Medication Sig Dispense Refill Last Dose  . acetaminophen (TYLENOL) 500 MG tablet Take 500 mg by mouth every 6 (six) hours as needed.   Past Week at Unknown time  . Elastic Bandages & Supports (COMFORT  FIT MATERNITY SUPP MED) MISC 1 Device by Does not apply route daily. 1 each 0 Past Month at Unknown time  . Fe Fum-FePoly-Vit C-Vit B3 (INTEGRA) 62.5-62.5-40-3 MG CAPS Take 1 capsule by mouth daily. 30 capsule 2 Past Week at Unknown time  . ferrous sulfate 325 (65 FE) MG EC tablet Take 325 mg by mouth 3 (three) times daily with meals.   Past Week at Unknown time  . Prenatal MV-Min-FA-Omega-3 (PRENATAL GUMMIES/DHA & FA) 0.4-32.5 MG CHEW Chew by mouth.   01/15/2020 at Unknown time  . Blood Pressure Monitoring (BLOOD PRESSURE KIT) DEVI 1 kit by Does not apply route once a week. Check BP Weekly.  Large Cuff.  Dx:Z13.6  Z34.86  O09.0 (Patient not taking: Reported on 12/16/2019) 1 kit 0   . metoCLOPramide (REGLAN) 10 MG tablet Take 1 tablet (10 mg total) by mouth every 6 (six) hours. 30 tablet 0     Review of Systems  All systems reviewed and negative except as  stated in HPI  Physical Exam Blood pressure 120/71, pulse 91, temperature 98.1 F (36.7 C), temperature source Oral, resp. rate 17, height _0  (1.676 m), weight 95.2 kg, last menstrual period 05/03/2019, SpO2 99 %, currently breastfeeding. General appearance: alert, oriented, NAD, pleasant patient Lungs: normal respiratory effort, CTA bilaterally Heart: RRR, S1S2 present, no murmurs appreciated Abdomen: soft, non-tender; gravid, FH appropriate for GA Extremities: No calf swelling or tenderness, no edema, 2+ DP pulses appreciated bilaterally Presentation: cephalic Fetal monitoring: Category 1 Dilation: 1 Effacement (%): 50 Station: -3 Exam by:: Hector Shade, RN  Prenatal labs: ABO, Rh: --/--/A POS (02/17 1957) Antibody: NEG (02/17 1957) Rubella: 4.46 (09/16 1718) RPR: Non Reactive (12/21 0945)  HBsAg: Negative (09/16 1718)  HIV: Non Reactive (12/21 0945)  GC/Chlamydia: Negative/Negative 01/11/2020 GBS: PENDING, prophylactic treatment  2-hr GTT: 86-120-125 (11/18/2019) Genetic screening: Negative (08/16/2019) Anatomy US: Ultrasounds with Normal findings   Prenatal Transfer Tool  Maternal Diabetes: No Genetic Screening: Normal Maternal Ultrasounds/Referrals: Normal Fetal Ultrasounds or other Referrals:  None Maternal Substance Abuse:  No Significant Maternal Medications:  None Significant Maternal Lab Results: GBS unknown - prophylactic treatment  Results for orders placed or performed during the hospital encounter of 01/15/20 (from the past 24 hour(s))  Respiratory Panel by RT PCR (Flu A&B, Covid) - Nasopharyngeal Swab   Collection Time: 01/15/20  7:03 PM   Specimen: Nasopharyngeal Swab  Result Value Ref Range   SARS Coronavirus 2 by RT PCR NEGATIVE NEGATIVE   Influenza A by PCR NEGATIVE NEGATIVE   Influenza B by PCR NEGATIVE NEGATIVE  CBC   Collection Time: 01/15/20  7:57 PM  Result Value Ref Range   WBC 6.5 4.0 - 10.5 K/uL   RBC 3.39 (L) 3.87 - 5.11 MIL/uL    Hemoglobin 10.6 (L) 12.0 - 15.0 g/dL   HCT 34.0 (L) 36.0 - 46.0 %   MCV 100.3 (H) 80.0 - 100.0 fL   MCH 31.3 26.0 - 34.0 pg   MCHC 31.2 30.0 - 36.0 g/dL   RDW 16.4 (H) 11.5 - 15.5 %   Platelets 255 150 - 400 K/uL   nRBC 0.0 0.0 - 0.2 %  Type and screen [facility]   Collection Time: 01/15/20  7:57 PM  Result Value Ref Range   ABO/RH(D) A POS    Antibody Screen NEG    Sample Expiration      01/18/2020,2359 Performed at [facility], [address], [place], [place] [address]  Results for orders placed or performed during the hospital encounter of 01/14/20 (from the past 24 hour(s))  Amnisure rupture of membrane (rom)not at [facility]   Collection Time: 01/14/20 10:06 PM  Result Value Ref Range   Amnisure ROM NEGATIVE     Patient Active Problem List   Diagnosis Date Noted  . Decreased fetal movement 01/15/2020  . Late prenatal care affecting pregnancy in second trimester 09/16/2019  . History of preterm delivery 08/14/2019  . Supervision of other high risk pregnancy, antepartum 08/11/2019  . 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 Prior pregnancy with fetal demise and current pregnancy 02/11/2019  . Migraine headache with aura 04/16/2014    Assessment: Dennisha Mouser is a 24 y.o. G3P1101 at 45w5dhere for IOL for Decreased Fetal Movement.   #Labor: Stage 1 Early #Pain: Tylenol, Fentanyl PRN, Percocet PRN #FWB: Category 1 #ID:  GBS unknown - prophylactic treatment #MOF: Breastfeed #MOC:Undecided, likes IUD vs Nexplanon #Circ:  N/A (girl)   HDaisy Floro2/17/2021, 9:34 PM   CNM attestation:  I have seen and examined this patient; I agree with above documentation in the resident's note.   AKailan Carmenis a 24y.o. G3P1101 here for IOL due to decreased FM, hx of term IUFD, and BPP 6/8  PE: BP 122/63   Pulse 88   Temp 98.1 F (36.7 C) (Oral)   Resp 17   Ht _0  (1.676 m)   Wt 95.2 kg   LMP 05/03/2019 (Exact Date)   SpO2 99%   BMI 33.88  kg/m  Gen: calm comfortable, NAD Resp: normal effort, no distress Abd: gravid  ROS, labs, PMH reviewed  Plan: Admit to Labor & Delivery Plan cx ripening with cytotec, possibly cervical foley, Pit/AROM prn BMZ x 2 doses PCN for GBS ppx (unknown culture) Anticipate vag del  KMyrtis SerCNM 01/16/2020, 12:42 AM

## 2020-01-15 NOTE — Progress Notes (Signed)
Subjective:  Adriana [name] is a 24 y.o. G3P1101 at [redacted]w[redacted]d being seen today for ongoing prenatal care.  She is currently monitored for the following issues for this high-risk pregnancy and has Migraine headache with aura; Prior pregnancy with fetal demise and current pregnancy; HSV-2 seropositive; Supervision of other high risk pregnancy, antepartum; History of preterm delivery; and Late prenatal care affecting pregnancy in second trimester on their problem list.  Patient reports no complaints.  Contractions: Irregular. Vag. Bleeding: None.  Movement: Present. Denies leaking of fluid.   The following portions of the patient's history were reviewed and updated as appropriate: allergies, current medications, past family history, past medical history, past social history, past surgical history and problem list. Problem list updated.  Objective:   Vitals:   01/15/20 1405  BP: 116/71  Pulse: 88  Weight: 208 lb (94.3 kg)    Fetal Status:     Movement: Present     General:  Alert, oriented and cooperative. Patient is in no acute distress.  Skin: Skin is warm and dry. No rash noted.   Cardiovascular: Normal heart rate noted  Respiratory: Normal respiratory effort, no problems with respiration noted  Abdomen: Soft, gravid, appropriate for gestational age. Pain/Pressure: Absent     Pelvic:  Cervical exam deferred        Extremities: Normal range of motion.  Edema: None  Mental Status: Normal mood and affect. Normal behavior. Normal judgment and thought content.   Urinalysis:      Assessment and Plan:  Pregnancy: G3P1101 at [redacted]w[redacted]d  1. Supervision of other high risk pregnancy, antepartum  2. [redacted] weeks gestation of pregnancy Rx: - Culture, beta strep (group b only)  3. Late prenatal care affecting pregnancy in second trimester  4. Prior pregnancy with fetal demise and current pregnancy in third trimester - nonreactive NST today - patient sent to [facility] for further evaluation - IOL planned for  37 weeks per MFM recommendation   Preterm labor symptoms and general obstetric precautions including but not limited to vaginal bleeding, contractions, leaking of fluid and fetal movement were reviewed in detail with the patient. Please refer to After Visit Summary for other counseling recommendations.  No follow-ups on file.   Brock Bad, MD  01/15/2020

## 2020-01-15 NOTE — Addendum Note (Signed)
Addended byFrutoso Chase on: 01/15/2020 04:27 PM   Modules accepted: Orders

## 2020-01-15 NOTE — MAU Note (Signed)
Pt sent from office for fetal monitoring.  Pt reports decreased FM, states baby is moving but not 10 movements in 2 hours.

## 2020-01-15 NOTE — Progress Notes (Signed)
   Induction Assessment Scheduling Form: Fax to [facility]:  [phone]  Adriana [name]                                                                                   DOB:  05/16/1996                                                            MRN:  [id number]                                                                     Phone #:   [phone]                         Provider:  Haskel Khan  GP:  [id number]                                                            Estimated Date of Delivery: 02/07/20  Dating Criteria: LMP    Medical Indications for induction:  Hx of IUFD Admission Date/Time:  Friday, Jan 17, 2020 at [facility] Gestational age on admission:  37 weeks   Filed Weights   01/15/20 1635  Weight: 208lbs   HIV:  Non Reactive (12/21 0945) GBS: Negative/-- (03/03 1355)  Bishop score TBD   Method of induction(proposed):  Cytotec   Scheduling Provider Signature:  Cherre Robins, CNM                                            Today's Date:  01/15/2020

## 2020-01-15 NOTE — MAU Note (Signed)
Covid swab obtained without difficulty and pt tol well. No symptoms 

## 2020-01-15 NOTE — MAU Provider Note (Addendum)
History     CSN: [id number]  Arrival date and time: 01/15/20 1620   None     Chief Complaint  Patient presents with  . Decreased Fetal Movement   Adriana [name] is a 24 y.o. G3P1101 at 56w5dwho receives care at [facility].  She presents today for Decreased Fetal Movement.  She states she started noticing decreased movement yesterday and was seen in the office today.  She was told "the heart rate is not acceling like it's suppose to" and was told to go to the MAU.  Patient reports she has felt movement, but not of the same quality.  She states she has been eating and drinking with no increase in movement. She states she feels movement "like 4-5x an hour" and questions if this is due to increasing fetal growth.  She states she ate soup prior to arrival.     OB History    Gravida  3   Para  2   Term  1   Preterm  1   AB      Living  1     SAB      TAB      Ectopic      Multiple  0   Live Births  1           Past Medical History:  Diagnosis Date  . Migraines     Past Surgical History:  Procedure Laterality Date  . HERNIA REPAIR     as an infant    History reviewed. No pertinent family history.  Social History   Tobacco Use  . Smoking status: Former Smoker    Quit date: 06/30/2016    Years since quitting: 3.5  . Smokeless tobacco: Never Used  Substance Use Topics  . Alcohol use: No    Alcohol/week: 0.0 standard drinks  . Drug use: No    Allergies:  Allergies  Allergen Reactions  . Imitrex [Sumatriptan] Other (See Comments)    Chest pain, heaviness, migraine  . Latex Rash    Medications Prior to Admission  Medication Sig Dispense Refill Last Dose  . acetaminophen (TYLENOL) 500 MG tablet Take 500 mg by mouth every 6 (six) hours as needed.   Past Week at Unknown time  . Elastic Bandages & Supports (COMFORT FIT MATERNITY SUPP MED) MISC 1 Device by Does not apply route daily. 1 each 0 Past Month at Unknown time  . Fe Fum-FePoly-Vit C-Vit B3  (INTEGRA) 62.5-62.5-40-3 MG CAPS Take 1 capsule by mouth daily. 30 capsule 2 Past Week at Unknown time  . ferrous sulfate 325 (65 FE) MG EC tablet Take 325 mg by mouth 3 (three) times daily with meals.   Past Week at Unknown time  . Prenatal MV-Min-FA-Omega-3 (PRENATAL GUMMIES/DHA & FA) 0.4-32.5 MG CHEW Chew by mouth.   01/15/2020 at Unknown time  . Blood Pressure Monitoring (BLOOD PRESSURE KIT) DEVI 1 kit by Does not apply route once a week. Check BP Weekly.  Large Cuff.  Dx:Z13.6  Z34.86  O09.0 (Patient not taking: Reported on 12/16/2019) 1 kit 0   . metoCLOPramide (REGLAN) 10 MG tablet Take 1 tablet (10 mg total) by mouth every 6 (six) hours. 30 tablet 0     Review of Systems  Constitutional: Negative for chills and fever.  Respiratory: Negative for cough and shortness of breath.   Gastrointestinal: Negative for abdominal pain, nausea and vomiting.  Genitourinary: Negative for difficulty urinating, dysuria, vaginal bleeding and vaginal discharge.  Musculoskeletal: Negative for  back pain.  Neurological: Negative for dizziness, light-headedness and headaches.   Physical Exam   Blood pressure 108/62, pulse 96, temperature 98.2 F (36.8 C), temperature source Oral, resp. rate 18, height 5' 6" (1.676 m), weight 95.2 kg, last menstrual period 05/03/2019, SpO2 99 %, currently breastfeeding.  Physical Exam  Constitutional: She is oriented to person, place, and time. She appears well-developed and well-nourished. No distress.  HENT:  Head: Normocephalic and atraumatic.  Eyes: Conjunctivae are normal.  Cardiovascular: Normal rate.  Respiratory: Effort normal.  GI: Soft.  Musculoskeletal:        General: Normal range of motion.     Cervical back: Normal range of motion.  Neurological: She is alert and oriented to person, place, and time.  Skin: Skin is warm and dry.  Psychiatric: She has a normal mood and affect. Her behavior is normal.    Fetal Assessment 140 bpm, Mod Var, -Decels,  +Accels initially, then negative Toco: Occasional   MAU Course   Results for orders placed or performed during the hospital encounter of 01/14/20 (from the past 24 hour(s))  Amnisure rupture of membrane (rom)not at [facility]     Status: None   Collection Time: 01/14/20 10:06 PM  Result Value Ref Range   Amnisure ROM NEGATIVE    No results found.  MDM PE Labs: EFM BPP Assessment and Plan  23 year old G3P1101  SIUP at 36.5weeks Cat I FT DFM NR NST  -Dr. Dove consulted prior to provider at bedside and discussed plan of care.  States she does not see indication for IOL tonight, but agrees with POC to perform BPP. -Patient informed of POC.  -Will send for BPP. -Patient scheduled for IOL for Friday Feb 19 at [facility] -Will await results.    Jessica L Emly MSN, CNM 01/15/2020, 5:20 PM   Reassessment (6:55 PM)  -US tech reports BPP 6/8 with -2 for breathing. -Dr. Dove consulted and advised admission for IOL. -Patient notified and reports anxiety and excitement-appropriately tearful. -Labor team contacted and V.Smith, CNM accepts admission.  -Nursing team instructed to obtain Covid and GBS swab.   Jessica L Emly MSN, CNM Advanced Practice Provider, [facility]  

## 2020-01-16 ENCOUNTER — Encounter (HOSPITAL_COMMUNITY): Payer: Self-pay | Admitting: Obstetrics & Gynecology

## 2020-01-16 ENCOUNTER — Inpatient Hospital Stay (HOSPITAL_COMMUNITY): Payer: Medicaid Other | Admitting: Anesthesiology

## 2020-01-16 ENCOUNTER — Encounter: Payer: Medicaid Other | Admitting: Obstetrics and Gynecology

## 2020-01-16 DIAGNOSIS — O368130 Decreased fetal movements, third trimester, not applicable or unspecified: Secondary | ICD-10-CM

## 2020-01-16 DIAGNOSIS — Z8759 Personal history of other complications of pregnancy, childbirth and the puerperium: Secondary | ICD-10-CM

## 2020-01-16 DIAGNOSIS — Z3A36 36 weeks gestation of pregnancy: Secondary | ICD-10-CM

## 2020-01-16 HISTORY — DX: Personal history of other complications of pregnancy, childbirth and the puerperium: Z87.59

## 2020-01-16 LAB — CERVICOVAGINAL ANCILLARY ONLY
Bacterial Vaginitis (gardnerella): NEGATIVE
Candida Glabrata: NEGATIVE
Candida Vaginitis: NEGATIVE
Chlamydia: NEGATIVE
Comment: NEGATIVE
Comment: NEGATIVE
Comment: NEGATIVE
Comment: NEGATIVE
Comment: NEGATIVE
Comment: NORMAL
Neisseria Gonorrhea: NEGATIVE
Trichomonas: NEGATIVE

## 2020-01-16 LAB — RPR: RPR Ser Ql: NONREACTIVE

## 2020-01-16 MED ORDER — EPHEDRINE 5 MG/ML INJ: 10.0000 mg | INTRAVENOUS | Status: DC | PRN

## 2020-01-16 MED ORDER — SIMETHICONE 80 MG PO CHEW: 80.0000 mg | CHEWABLE_TABLET | ORAL | Status: DC | PRN

## 2020-01-16 MED ORDER — ONDANSETRON HCL 4 MG PO TABS: 4.0000 mg | ORAL_TABLET | ORAL | Status: DC | PRN

## 2020-01-16 MED ORDER — MISOPROSTOL 25 MCG QUARTER TABLET
ORAL_TABLET | ORAL | Status: AC
  Filled 2020-01-16: qty 1

## 2020-01-16 MED ORDER — FENTANYL-BUPIVACAINE-NACL 0.5-0.125-0.9 MG/250ML-% EP SOLN
12.0000 mL/h | EPIDURAL | Status: DC | PRN
  Filled 2020-01-16: qty 250

## 2020-01-16 MED ORDER — SENNOSIDES-DOCUSATE SODIUM 8.6-50 MG PO TABS
2.0000 | ORAL_TABLET | ORAL | Status: DC
  Administered 2020-01-16 – 2020-01-17 (×2): 2 via ORAL
  Filled 2020-01-16 (×2): qty 2

## 2020-01-16 MED ORDER — DIPHENHYDRAMINE HCL 50 MG/ML IJ SOLN: 12.5000 mg | INTRAMUSCULAR | Status: DC | PRN

## 2020-01-16 MED ORDER — TETANUS-DIPHTH-ACELL PERTUSSIS 5-2.5-18.5 LF-MCG/0.5 IM SUSP: 0.5000 mL | Freq: Once | INTRAMUSCULAR | Status: DC

## 2020-01-16 MED ORDER — OXYTOCIN 40 UNITS IN NORMAL SALINE INFUSION - SIMPLE MED
1.0000 m[IU]/min | INTRAVENOUS | Status: DC
  Administered 2020-01-16: 14:00:00 2 m[IU]/min via INTRAVENOUS
  Filled 2020-01-16: qty 1000

## 2020-01-16 MED ORDER — TERBUTALINE SULFATE 1 MG/ML IJ SOLN: 0.2500 mg | Freq: Once | INTRAMUSCULAR | Status: DC | PRN

## 2020-01-16 MED ORDER — MISOPROSTOL 50MCG HALF TABLET
50.0000 ug | ORAL_TABLET | ORAL | Status: DC
  Administered 2020-01-16: 50 ug via BUCCAL
  Filled 2020-01-16: qty 1

## 2020-01-16 MED ORDER — MISOPROSTOL 100 MCG PO TABS
25.0000 ug | ORAL_TABLET | ORAL | Status: DC
  Administered 2020-01-16: 09:00:00 25 ug via VAGINAL

## 2020-01-16 MED ORDER — MEASLES, MUMPS & RUBELLA VAC IJ SOLR: 0.5000 mL | Freq: Once | INTRAMUSCULAR | Status: DC

## 2020-01-16 MED ORDER — LACTATED RINGERS IV SOLN: 500.0000 mL | Freq: Once | INTRAVENOUS | Status: DC

## 2020-01-16 MED ORDER — ACETAMINOPHEN 325 MG PO TABS
650.0000 mg | ORAL_TABLET | Freq: Four times a day (QID) | ORAL | Status: DC | PRN
  Administered 2020-01-17 – 2020-01-18 (×4): 650 mg via ORAL
  Filled 2020-01-16 (×4): qty 2

## 2020-01-16 MED ORDER — COCONUT OIL OIL: 1.0000 "application " | TOPICAL_OIL | Status: DC | PRN

## 2020-01-16 MED ORDER — DIPHENHYDRAMINE HCL 25 MG PO CAPS: 25.0000 mg | ORAL_CAPSULE | Freq: Four times a day (QID) | ORAL | Status: DC | PRN

## 2020-01-16 MED ORDER — SODIUM CHLORIDE (PF) 0.9 % IJ SOLN
INTRAMUSCULAR | Status: DC | PRN
  Administered 2020-01-16: 12 mL/h via EPIDURAL

## 2020-01-16 MED ORDER — PHENYLEPHRINE 40 MCG/ML (10ML) SYRINGE FOR IV PUSH (FOR BLOOD PRESSURE SUPPORT): 80.0000 ug | PREFILLED_SYRINGE | INTRAVENOUS | Status: DC | PRN

## 2020-01-16 MED ORDER — IBUPROFEN 600 MG PO TABS
600.0000 mg | ORAL_TABLET | Freq: Three times a day (TID) | ORAL | Status: DC | PRN
  Administered 2020-01-16 – 2020-01-18 (×6): 600 mg via ORAL
  Filled 2020-01-16 (×5): qty 1

## 2020-01-16 MED ORDER — DIBUCAINE (PERIANAL) 1 % EX OINT: 1.0000 "application " | TOPICAL_OINTMENT | CUTANEOUS | Status: DC | PRN

## 2020-01-16 MED ORDER — BENZOCAINE-MENTHOL 20-0.5 % EX AERO: 1.0000 "application " | INHALATION_SPRAY | CUTANEOUS | Status: DC | PRN

## 2020-01-16 MED ORDER — ONDANSETRON HCL 4 MG/2ML IJ SOLN: 4.0000 mg | INTRAMUSCULAR | Status: DC | PRN

## 2020-01-16 MED ORDER — PRENATAL MULTIVITAMIN CH
1.0000 | ORAL_TABLET | Freq: Every day | ORAL | Status: DC
  Administered 2020-01-18: 1 via ORAL
  Filled 2020-01-16 (×2): qty 1

## 2020-01-16 MED ORDER — LIDOCAINE-EPINEPHRINE (PF) 2 %-1:200000 IJ SOLN
INTRAMUSCULAR | Status: DC | PRN
  Administered 2020-01-16 (×2): 2 mL via EPIDURAL

## 2020-01-16 MED ORDER — WITCH HAZEL-GLYCERIN EX PADS: 1.0000 "application " | MEDICATED_PAD | CUTANEOUS | Status: DC | PRN

## 2020-01-16 MED ORDER — BUPIVACAINE HCL (PF) 0.25 % IJ SOLN
INTRAMUSCULAR | Status: DC | PRN
  Administered 2020-01-16: 10 mL via EPIDURAL

## 2020-01-16 NOTE — Progress Notes (Signed)
At 1120 after srom, sve, and position change a large clot came out of the vagina.  Sam Weinhold, cnm called to bedside to evaluate.  CNM did SVE with no more clots noted.  Clot weighed with Triton 102ml.

## 2020-01-16 NOTE — Anesthesia Preprocedure Evaluation (Signed)
Anesthesia Evaluation  Patient identified by MRN, date of birth, ID band Patient awake    Reviewed: Allergy & Precautions, NPO status , Patient's Chart, lab work & pertinent test results  Airway Mallampati: II  TM Distance: >3 FB Neck ROM: Full    Dental no notable dental hx.    Pulmonary neg pulmonary ROS, former smoker,    Pulmonary exam normal breath sounds clear to auscultation       Cardiovascular negative cardio ROS Normal cardiovascular exam Rhythm:Regular Rate:Normal     Neuro/Psych  Headaches, negative psych ROS   GI/Hepatic negative GI ROS, Neg liver ROS,   Endo/Other  negative endocrine ROS  Renal/GU negative Renal ROS  negative genitourinary   Musculoskeletal negative musculoskeletal ROS (+)   Abdominal   Peds  Hematology negative hematology ROS (+)   Anesthesia Other Findings   Reproductive/Obstetrics (+) Pregnancy                             Anesthesia Physical Anesthesia Plan  ASA: II  Anesthesia Plan: Epidural   Post-op Pain Management:    Induction:   PONV Risk Score and Plan: Treatment may vary due to age or medical condition  Airway Management Planned: Natural Airway  Additional Equipment:   Intra-op Plan:   Post-operative Plan:   Informed Consent: I have reviewed the patients History and Physical, chart, labs and discussed the procedure including the risks, benefits and alternatives for the proposed anesthesia with the patient or authorized representative who has indicated his/her understanding and acceptance.       Plan Discussed with: Anesthesiologist  Anesthesia Plan Comments: (Patient identified. Risks, benefits, options discussed with patient including but not limited to bleeding, infection, nerve damage, paralysis, failed block, incomplete pain control, headache, blood pressure changes, nausea, vomiting, reactions to medication, itching, and  post partum back pain. Confirmed with bedside nurse the patient's most recent platelet count. Confirmed with the patient that they are not taking any anticoagulation, have any bleeding history or any family history of bleeding disorders. Patient expressed understanding and wishes to proceed. All questions were answered. )        Anesthesia Quick Evaluation

## 2020-01-16 NOTE — Progress Notes (Signed)
Dark red blood backed up in iupc when placed, flushed with 20cc LR with small resistance met initially, and then flushed easily.

## 2020-01-16 NOTE — Anesthesia Procedure Notes (Signed)
Epidural Patient location during procedure: OB Start time: 01/16/2020 7:45 AM End time: 01/16/2020 8:00 AM  Staffing Anesthesiologist: Elmer Picker, MD Performed: anesthesiologist   Preanesthetic Checklist Completed: patient identified, IV checked, risks and benefits discussed, monitors and equipment checked, pre-op evaluation and timeout performed  Epidural Patient position: sitting Prep: DuraPrep and site prepped and draped Patient monitoring: continuous pulse ox, blood pressure, heart rate and cardiac monitor Approach: midline Location: L3-L4 Injection technique: LOR air  Needle:  Needle type: Tuohy  Needle gauge: 17 G Needle length: 9 cm Needle insertion depth: 6 cm Catheter type: closed end flexible Catheter size: 19 Gauge Catheter at skin depth: 12 cm Test dose: negative  Assessment Sensory level: T8 Events: blood not aspirated, injection not painful, no injection resistance, no paresthesia and negative IV test  Additional Notes Patient identified. Risks/Benefits/Options discussed with patient including but not limited to bleeding, infection, nerve damage, paralysis, failed block, incomplete pain control, headache, blood pressure changes, nausea, vomiting, reactions to medication both or allergic, itching and postpartum back pain. Confirmed with bedside nurse the patient's most recent platelet count. Confirmed with patient that they are not currently taking any anticoagulation, have any bleeding history or any family history of bleeding disorders. Patient expressed understanding and wished to proceed. All questions were answered. Sterile technique was used throughout the entire procedure. Please see nursing notes for vital signs. Test dose was given through epidural catheter and negative prior to continuing to dose epidural or start infusion. Warning signs of high block given to the patient including shortness of breath, tingling/numbness in hands, complete motor block,  or any concerning symptoms with instructions to call for help. Patient was given instructions on fall risk and not to get out of bed. All questions and concerns addressed with instructions to call with any issues or inadequate analgesia.  Reason for block:procedure for pain

## 2020-01-16 NOTE — Progress Notes (Signed)
Labor Progress Note Adriana [name] is a 24 y.o. G3P1101 at [redacted]w[redacted]d presented for IOL for DFM. S: Comfortable with epidural.   O:  BP 111/60   Pulse (!) 110   Temp 98 F (36.7 C) (Oral)   Resp 18   Ht 5\' 6"  (1.676 m)   Wt 95.2 kg   LMP 05/03/2019 (Exact Date)   SpO2 99%   BMI 33.88 kg/m  EFM: 145, moderate variability, pos accels, no decels, reactive TOCO: Difficult to pick up but appear frequent  CVE: Dilation: 4 Effacement (%): 80 Cervical Position: Middle Station: -2 Presentation: Vertex Exam by:: [email or web address], cnm   A&P: 24 y.o. 21 [redacted]w[redacted]d here for IOL for DFM with BPP 6/8. Hx of IUFD at term.  #Labor: Progressing well. S/p Cytotec x5. Cervix thinning. Patient had SROM but still has forebag. Unable to trace ctx enough to start Pit. Rupture of forebag with clear fluid at this exam and IUPC placed posteriorly. Anticipate SVD. #Pain: epidural #FWB: Cat I #GBS pending; PCN due to preterm status  8/8, MD 1:19 PM

## 2020-01-16 NOTE — Progress Notes (Signed)
Adriana [name] is a 24 y.o. G3P1101 at [redacted]w[redacted]d   Subjective: Denies pain, questions, concerns  Objective: BP (!) 114/57   Pulse (!) 104   Temp 98.5 F (36.9 C) (Oral)   Resp 18   Ht 5\' 6"  (1.676 m)   Wt 95.2 kg   LMP 05/03/2019 (Exact Date)   SpO2 99%   BMI 33.88 kg/m  No intake/output data recorded. No intake/output data recorded.  FHT:  FHR: 140 bpm, variability: moderate,  accelerations:  Present,  decelerations:  Absent UC:   irregular, every 2-3 minutes SVE:   Dilation: 4 Effacement (%): 80 Station: -2 Exam by:: [email or web address], cnm       Labs: Lab Results  Component Value Date   WBC 6.5 01/15/2020   HGB 10.6 (L) 01/15/2020   HCT 34.0 (L) 01/15/2020   MCV 100.3 (H) 01/15/2020   PLT 255 01/15/2020    Assessment / Plan: --Called to room by L. Lima RN for passage of large clot following SROM at 1115 --Cat I tracing --No active bleeding or additional clots identified on my exam --Per Triton, clot 71mL --Reassurance given to patient. No concerning signs related to abruption at this time. Possible accumulation of bloody show over time. Continue to monitor --S/p Cytotec at 0912. Assess for Pitocin augmentation when 4 hours from Cytotec PRN --Anticipate NSVD  43m, CNM 01/16/2020, 12:05 PM

## 2020-01-16 NOTE — Progress Notes (Signed)
LABOR PROGRESS NOTE  Adriana [name] is a 24 y.o. G3P1101 at [redacted]w[redacted]d admitted for IOL for Decreased Fetal Movements.   Subjective: Feeling increasing pressure/pain, desiring pain meds. Having fetal movement, no bleeding or LOF.  Objective: BP 120/80   Pulse 82   Temp 97.9 F (36.6 C) (Oral)   Resp 17   Ht 5\' 6"  (1.676 m)   Wt 95.2 kg   LMP 05/03/2019 (Exact Date)   SpO2 99%   BMI 33.88 kg/m  or  Vitals:   01/16/20 0009 01/16/20 0102 01/16/20 0202 01/16/20 0401  BP: 122/63 122/75 (!) 102/56 120/80  Pulse: 88 82 85 82  Resp:      Temp: 97.9 F (36.6 C)     TempSrc: Oral     SpO2:      Weight:      Height:       Dilation: 3 Effacement (%): 50 Cervical Position: Posterior Station: -3 Presentation: Vertex Exam by:: Andeson, DO FHT: baseline rate 135, moderate varibility, 15x15 acel, 0 decel Toco: Cytotec x 4  Labs: Lab Results  Component Value Date   WBC 6.5 01/15/2020   HGB 10.6 (L) 01/15/2020   HCT 34.0 (L) 01/15/2020   MCV 100.3 (H) 01/15/2020   PLT 255 01/15/2020    Patient Active Problem List   Diagnosis Date Noted  . Decreased fetal movement 01/15/2020  . Late prenatal care affecting pregnancy in second trimester 09/16/2019  . History of preterm delivery 08/14/2019  . Supervision of other high risk pregnancy, antepartum 08/11/2019  . 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 Prior pregnancy with fetal demise and current pregnancy 02/11/2019  . Migraine headache with aura 04/16/2014    Assessment / Plan: 24 y.o. G3P1101 at [redacted]w[redacted]d here for IOL for Decreased Fetal Movements.   Labor: Early, cervix making steady change with Cytotec. Plan to re-evaluate around 9am 2/18 for continued Cytotec vs starting pitocin.  Fetal Wellbeing:  Category 1 Pain Control:  IV Fentanyl, requests Epidural closer to time Anticipated MOD:  Vaginal   23/18, DO [facility], PGY-2 01/16/2020 6:58 AM

## 2020-01-16 NOTE — Discharge Summary (Signed)
Postpartum Discharge Summary      Patient Name: Adriana [name] DOB: 1996/06/29 MRN: [id number]  Date of admission: 01/15/2020 Delivering Provider: Chauncey Mann   Date of discharge: 01/18/2020  Admitting diagnosis: Decreased fetal movement [O36.8190] Intrauterine pregnancy: [redacted]w[redacted]d    Secondary diagnosis:  Active Problems:   HSV-2 seropositive   History of preterm delivery   Late prenatal care affecting pregnancy in second trimester   Decreased fetal movement   History of IUFD  Additional problems: None     Discharge diagnosis: Term Pregnancy Delivered                                                                                                Post partum procedures:none  Augmentation: AROM, Pitocin and Cytotec  Complications: None  Hospital course:  Induction of Labor With Vaginal Delivery   24y.o. yo G3P1101 at 365w6das admitted to the hospital 01/15/2020 for induction of labor.  Indication for induction: DFM and BPP 6/8.  Patient had an uncomplicated labor course as follows: Initial SVE: 1/50/-3. Patient received Cytotec. SROM occurred. AROM of fore-bag. Received epidural and Pitocin. She then progressed to complete.  Membrane Rupture Time/Date: 11:10 AM ,01/16/2020   Intrapartum Procedures: Episiotomy: None [1]                                         Lacerations:  None [1]  Patient had delivery of a Viable infant.  Information for the patient's newborn:  BrReizel, Calzada0[[id number]]Delivery Method: Vaginal, Spontaneous(Filed from Delivery Summary)    01/16/2020  Details of delivery can be found in separate delivery note.  Patient had a routine postpartum course. Nexplanon not placed. Patient desires to wait until PP visit. Patient is discharged home 01/18/20. Delivery time: 2:31 PM    Magnesium Sulfate received: No BMZ received: No Rhophylac:No MMR:No Transfusion:No  Physical exam  Vitals:   01/17/20 0534 01/17/20 1509 01/17/20 2140 01/18/20 0515   BP: 112/66 109/70 115/69 (!) 104/54  Pulse: 76 78 69 69  Resp: '18 17 18 18  ' Temp: 98.1 F (36.7 C) 98.4 F (36.9 C) 98.2 F (36.8 C) 98.2 F (36.8 C)  TempSrc: Oral Oral Oral Oral  SpO2: 97% 100%  98%  Weight:      Height:       General: alert, cooperative and no distress Lochia: appropriate Uterine Fundus: firm Incision: N/A DVT Evaluation: No evidence of DVT seen on physical exam. Negative Homan's sign. No cords or calf tenderness. No significant calf/ankle edema. Labs: Lab Results  Component Value Date   WBC 6.5 01/15/2020   HGB 10.6 (L) 01/15/2020   HCT 34.0 (L) 01/15/2020   MCV 100.3 (H) 01/15/2020   PLT 255 01/15/2020   CMP Latest Ref Rng & Units 02/25/2019  Glucose 65 - 99 mg/dL 82  BUN 6 - 20 mg/dL 11  Creatinine 0.57 - 1.00 mg/dL 0.92  Sodium 134 - 144 mmol/L 145(H)  Potassium 3.5 - 5.2 mmol/L 5.1  Chloride 96 - 106 mmol/L 103  CO2 20 - 29 mmol/L 26  Calcium 8.7 - 10.2 mg/dL 9.7  Total Protein 6.0 - 8.5 g/dL 7.3  Total Bilirubin 0.0 - 1.2 mg/dL 0.3  Alkaline Phos 39 - 117 IU/L 96  AST 0 - 40 IU/L 18  ALT 0 - 32 IU/L 26   Edinburgh Score: Edinburgh Postnatal Depression Scale Screening Tool 01/17/2020  I have been able to laugh and see the funny side of things. 0  I have looked forward with enjoyment to things. 0  I have blamed myself unnecessarily when things went wrong. 0  I have been anxious or worried for no good reason. 1  I have felt scared or panicky for no good reason. 0  Things have been getting on top of me. 0  I have been so unhappy that I have had difficulty sleeping. 0  I have felt sad or miserable. 0  I have been so unhappy that I have been crying. 0  The thought of harming myself has occurred to me. 0  Edinburgh Postnatal Depression Scale Total 1    Discharge instruction: per After Visit Summary and "Baby and Me Booklet".  After visit meds:  Allergies as of 01/18/2020      Reactions   Imitrex [sumatriptan] Other (See Comments)    Chest pain, heaviness, migraine   Latex Rash      Medication List    STOP taking these medications   acetaminophen 500 MG tablet Commonly known as: TYLENOL   calcium carbonate 500 MG chewable tablet Commonly known as: TUMS - dosed in mg elemental calcium   Comfort Fit Maternity Supp Med Misc     TAKE these medications   Blood Pressure Kit Devi 1 kit by Does not apply route once a week. Check BP Weekly.  Large Cuff.  Dx:Z13.6  Z34.86  O09.0   ibuprofen 600 MG tablet Commonly known as: ADVIL Take 1 tablet (600 mg total) by mouth every 8 (eight) hours as needed for mild pain.   Integra 62.5-62.5-40-3 MG Caps Take 1 capsule by mouth daily.   prenatal multivitamin Tabs tablet Take 1 tablet by mouth daily at 12 noon.       Diet: routine diet  Activity: Advance as tolerated. Pelvic rest for 6 weeks.   Outpatient follow up:4 weeks Follow up Appt: Future Appointments  Date Time Provider [place]  02/20/2020  3:00 PM Gavin Pound, CNM [place] None   Follow up Visit:    Please schedule this patient for Postpartum visit in: 4 weeks with the following provider: Any provider In-Person For C/S patients schedule nurse incision check in weeks 2 weeks: no Low risk pregnancy complicated by: hx of IUFD Delivery mode:  SVD Anticipated Birth Control:  Nexplanon PP Procedures needed: none  Schedule Integrated [place] visit: no     Newborn Data: Live born female "Beau" Birth Weight: 6 lbs 8.9 oz (2975 g)  APGAR: 9, 9  Newborn Delivery   Birth date/time: 01/16/2020 14:31:00 Delivery type: Vaginal, Spontaneous      Baby Feeding: Breast Disposition:home with mother   01/18/2020 Laury Deep, CNM

## 2020-01-16 NOTE — Progress Notes (Signed)
Adriana [name] is a 24 y.o. G3P1101 at [redacted]w[redacted]d   Subjective: S/p epidural and resting comfortably. Partner at bedside  Objective: BP 117/60   Pulse (!) 116   Temp 98.5 F (36.9 C) (Oral)   Resp 18   Ht 5\' 6"  (1.676 m)   Wt 95.2 kg   LMP 05/03/2019 (Exact Date)   SpO2 99%   BMI 33.88 kg/m  No intake/output data recorded. No intake/output data recorded.  FHT:  FHR: 140 bpm, variability: moderate,  accelerations:  Present,  decelerations:  Absent UC:   irregular, every 2-5 minutes SVE:   Dilation: 3.5 Effacement (%): Thick Station: Ballotable Exam by:: Nolin Grell, cnm  Labs: Lab Results  Component Value Date   WBC 6.5 01/15/2020   HGB 10.6 (L) 01/15/2020   HCT 34.0 (L) 01/15/2020   MCV 100.3 (H) 01/15/2020   PLT 255 01/15/2020    Assessment / Plan: Cervix now 3.5/thick and long/-3 with bulging bag Advised vaginal Cytotec due to thickness of cervix Plan to start Pitocin in 4 hours Brief periods of minimal varibability since admission Positive scalp stim with my exam Variability improving with repositioning, continue to monitor Anticipate NSVD  01/17/2020, CNM 01/16/2020, 9:03 AM

## 2020-01-17 ENCOUNTER — Encounter: Payer: Medicaid Other | Admitting: Medical

## 2020-01-17 ENCOUNTER — Encounter (HOSPITAL_COMMUNITY): Payer: Medicaid Other

## 2020-01-17 MED ORDER — LIDOCAINE HCL 1 % IJ SOLN
0.0000 mL | Freq: Once | INTRAMUSCULAR | Status: DC | PRN
  Filled 2020-01-17: qty 20

## 2020-01-17 MED ORDER — ETONOGESTREL 68 MG ~~LOC~~ IMPL
68.0000 mg | DRUG_IMPLANT | Freq: Once | SUBCUTANEOUS | Status: DC
  Filled 2020-01-17: qty 1

## 2020-01-17 NOTE — Progress Notes (Signed)
Post Partum Day 1 Subjective: no complaints, up ad lib, voiding and tolerating PO, small lochia, plans to breastfeed, wants Nexplanon outpt  Objective: Blood pressure 112/66, pulse 76, temperature 98.1 F (36.7 C), temperature source Oral, resp. rate 18, height 5\' 6"  (1.676 m), weight 95.2 kg, last menstrual period 05/03/2019, SpO2 97 %, unknown if currently breastfeeding.  Physical Exam:  General: alert, cooperative and no distress Lochia:normal flow Chest: CTAB Heart: RRR no m/r/g Abdomen: +BS, soft, nontender,  Uterine Fundus: firm DVT Evaluation: No evidence of DVT seen on physical exam. Extremities: trace edema  Recent Labs    01/15/20 1957  HGB 10.6*  HCT 34.0*    Assessment/Plan: Plan for discharge tomorrow   LOS: 2 days   01/17/20 01/17/2020, 7:38 AM

## 2020-01-17 NOTE — Lactation Note (Signed)
This note was copied from a baby's chart. Lactation Consultation Note  Patient Name: Boy Cami Delawder PIRJJ'O Date: 01/17/2020 Reason for consult: Initial assessment;Late-preterm 34-36.6wks P2, 10 hour female   LPTI. Per mom, she receives [facility] in [facility] but doesn't have a breast pump at home. LC gave mom a harmony hand pump and did [facility] referral for [employer] loaner for [facility].  Mom with hx: HSV-2 no active lesions noted. Mom has  previous breastfeeding experience,  she breastfeed her 60 year old daughter for 11 months who was also a  LPTI and per mom, she pumped and supplemented with formula she doesn't want formula at this time wants only  to breastfeed infant, give infant back her EBM and will think about using the human donor milk. Mom  will talk with dad and re-assess in morning after family discuss it amongst them selves regarding supplementing infant with donor breast milk.  Per mom, infant breastfeed 8 times since delivery but very short intervals 5 mins or less and falls the sleep quickly at the  breast.  When LC entered the room mom was attempting the breast feed without any pillow support and slumped over in bed, when infant came off the breast mom's nipple was pinched. LC gave mom pillows to support herself and infant. Mom latched infant on left breast using the cross cradle hold, LC dicussed mom support her breast and infant's neck, should and back, mom brought infant to breast chin first with wide mouth, tongue down, infant breastfeed with swallows for 18 minutes. LC discussed ways to keep infant awake to breastfeed. LC discussed with mom LPTI behaviors breastfeeding STS, 8 to 12 times within 24 hours, not make infant wait to breastfeed not exceed 3 hours without feeding infant and not feeding infant past 30 minutes. Parents were given green sheet "Caring for Your Late Preterm Infant". Mom knows to call RN or LC if she has any questions, concerns or need assistance with  latching infant at breast. Dad was doing STS with infant as LC left the room. Mom will use DEBP every 3 hours for 15 minutes on initial setting to help establish her milk supply. Mom shown how to use DEBP & how to disassemble, clean, & reassemble parts. LC discussed community support resources when discharge: LC hot line, LC out patient clinic and breastfeeding online support group.    Maternal Data Formula Feeding for Exclusion: No Has patient been taught Hand Expression?: Yes Does the patient have breastfeeding experience prior to this delivery?: Yes  Feeding Feeding Type: Breast Fed  LATCH Score Latch: Grasps breast easily, tongue down, lips flanged, rhythmical sucking.  Audible Swallowing: Spontaneous and intermittent  Type of Nipple: Everted at rest and after stimulation  Comfort (Breast/Nipple): Soft / non-tender  Hold (Positioning): Assistance needed to correctly position infant at breast and maintain latch.  LATCH Score: 9  Interventions Interventions: Breast feeding basics reviewed;Breast compression;Assisted with latch;Adjust position;Hand pump;DEBP;Support pillows;Skin to skin;Breast massage;Position options;Hand express;Expressed milk  Lactation Tools Discussed/Used Tools: Pump Breast pump type: Double-Electric Breast Pump;Manual WIC Program: Yes Pump Review: Setup, frequency, and cleaning;Milk Storage Initiated by:: Danelle Earthly, IBCLC Date initiated:: 01/17/20   Consult Status Consult Status: Follow-up Date: 01/17/20 Follow-up type: In-patient    Danelle Earthly 01/17/2020, 12:36 AM

## 2020-01-17 NOTE — Anesthesia Postprocedure Evaluation (Signed)
Anesthesia Post Note  Patient: Adriana [name]  Procedure(s) Performed: AN AD HOC LABOR EPIDURAL     Patient location during evaluation: Mother Baby Anesthesia Type: Epidural Level of consciousness: awake and alert Pain management: pain level controlled Vital Signs Assessment: post-procedure vital signs reviewed and stable Respiratory status: spontaneous breathing, nonlabored ventilation and respiratory function stable Cardiovascular status: stable Postop Assessment: no headache, no backache and epidural receding Anesthetic complications: no    Last Vitals:  Vitals:   01/17/20 0202 01/17/20 0534  BP: (!) 112/56 112/66  Pulse: 91 76  Resp: 18 18  Temp: 37.1 C 36.7 C  SpO2: 98% 97%    Last Pain:  Vitals:   01/17/20 0540  TempSrc:   PainSc: 2    Pain Goal:                   Junious Silk

## 2020-01-17 NOTE — Lactation Note (Signed)
This note was copied from a baby's chart. Lactation Consultation Note  Patient Name: Adriana [name] [id number] Date: 01/17/2020   Infant was 24 hrs old & had recently drunk 20 mL of formula, but was interested in feeding more. I fed "Beau" a residual amount of EBM on a gloved finger & I noted him to have an excellent suck. Infant was still cueing for more, so Mom got in a side-lying position & Beau latched with ease. Swallows were immediately noted & continued while I watched from bedside. Mom was comfortable with latch after the initial tenderness subsided.   Mom has small-diameter nipples. I provided her with size 21 flanges to use for pumping. Mom has a minute scab on her R nipple. RN has provided Comfort Gels.   Impression: this late preterm infant is nursing very well. Mom to supplement as desired/needed with EBM/formula.   Lurline Hare [facility] 01/17/2020, 3:47 PM

## 2020-01-18 ENCOUNTER — Encounter (HOSPITAL_COMMUNITY): Payer: Medicaid Other

## 2020-01-18 MED ORDER — IBUPROFEN 600 MG PO TABS: 600.0000 mg | ORAL_TABLET | Freq: Three times a day (TID) | ORAL | 0 refills | Status: DC | PRN

## 2020-01-18 NOTE — Lactation Note (Signed)
This note was copied from a baby's chart. Lactation Consultation Note  Patient Name: Adriana [name] [id number] Date: 01/18/2020   [facility] Follow Up Visit:  RN in room completing discharge instructions when I arrived.  Reviewed pump rental policy and mother had no questions.  Received $30 cash and money and paperwork placed in folder and put in [employer].  Mother aware of her return date and the policy to receive the $30 back when she returns the pump.  Father present.  Mother will finish feeding baby and family will then call RN for assistance with discharge.   Maternal Data    Feeding Feeding Type: Breast Fed  LATCH Score                   Interventions    Lactation Tools Discussed/Used     Consult Status      Elesha Thedford R Derik Fults 01/18/2020, 2:24 PM

## 2020-01-18 NOTE — Lactation Note (Signed)
This note was copied from a baby's chart. Lactation Consultation Note  Patient Name: Adriana [name] [id number] Date: 01/18/2020    P2 mother whose infant is now 54 hours old.  This is a LPTI at 36+6 weeks.  Mother has been breast/bottle feeding.    Mother had no questions/concerns related to breast feeding.  She will continue to feed 8-12 times/24 hours or sooner if baby shows feeding cues.  Asked mother not to allow baby to go longer than 3 hours without feeding.  She will continue to pump and feed back any EBM she obtains with pumping.  Mother had 20 mls of EBM at bedside which I encouraged her to feed back.  Baby is using the gold slow flow nipple and mother stated he is doing well with that nipple.  Mother does not have a DEBP for home use.  She will obtain a [facility] appointment next week.  Offered a pump through our [facility] loaner program and mother is interested.  She will call for my assistance with this once the discharge is put in place today.  RN updated and I will return for pump rental.  Father present.   Maternal Data    Feeding Feeding Type: Breast Fed  LATCH Score                   Interventions    Lactation Tools Discussed/Used     Consult Status      Nalini Alcaraz R Keaten Mashek 01/18/2020, 2:18 PM

## 2020-01-19 LAB — CULTURE, BETA STREP (GROUP B ONLY): Strep Gp B Culture: NEGATIVE

## 2020-01-21 ENCOUNTER — Ambulatory Visit (HOSPITAL_COMMUNITY): Payer: Medicaid Other

## 2020-02-14 ENCOUNTER — Ambulatory Visit: Payer: Medicaid Other | Admitting: Nurse Practitioner

## 2020-02-20 ENCOUNTER — Ambulatory Visit: Payer: Medicaid Other

## 2020-03-18 ENCOUNTER — Ambulatory Visit (INDEPENDENT_AMBULATORY_CARE_PROVIDER_SITE_OTHER): Payer: Medicaid Other

## 2020-03-18 ENCOUNTER — Other Ambulatory Visit: Payer: Self-pay

## 2020-03-18 ENCOUNTER — Other Ambulatory Visit (HOSPITAL_COMMUNITY)
Admission: RE | Admit: 2020-03-18 | Discharge: 2020-03-18 | Disposition: A | Discharge status: Home / Self Care | Payer: Medicaid Other | Source: Ambulatory Visit

## 2020-03-18 VITALS — BP 128/84 | HR 84 | Ht 66.0 in | Wt 178.0 lb

## 2020-03-18 DIAGNOSIS — Z3202 Encounter for pregnancy test, result negative: Secondary | ICD-10-CM

## 2020-03-18 DIAGNOSIS — Z30011 Encounter for initial prescription of contraceptive pills: Secondary | ICD-10-CM

## 2020-03-18 DIAGNOSIS — Z7251 High risk heterosexual behavior: Secondary | ICD-10-CM | POA: Insufficient documentation

## 2020-03-18 LAB — POCT URINE PREGNANCY: Preg Test, Ur: NEGATIVE

## 2020-03-18 MED ORDER — DROSPIRENONE 4 MG PO TABS: 1.0000 | ORAL_TABLET | Freq: Every day | ORAL | 2 refills | Status: DC

## 2020-03-18 MED ORDER — LEVONORGESTREL 1.5 MG PO TABS: 1.5000 mg | ORAL_TABLET | Freq: Once | ORAL | 0 refills | Status: AC

## 2020-03-18 NOTE — Patient Instructions (Signed)
Oral Contraception Use Oral contraceptive pills (OCPs) are medicines that you take to prevent pregnancy. OCPs work by:  Preventing the ovaries from releasing eggs.  Thickening mucus in the lower part of the uterus (cervix), which prevents sperm from entering the uterus.  Thinning the lining of the uterus (endometrium), which prevents a fertilized egg from attaching to the endometrium. OCPs are highly effective when taken exactly as prescribed. However, OCPs do not prevent sexually transmitted infections (STIs). Safe sex practices, such as using condoms while on an OCP, can help prevent STIs. Before taking OCPs, you may have a physical exam, blood test, and Pap test. A Pap test involves taking a sample of cells from your cervix to check for cancer. Discuss with your health care provider the possible side effects of the OCP you may be prescribed. When you start an OCP, be aware that it can take 2-3 months for your body to adjust to changes in hormone levels. How to take oral contraceptive pills Follow instructions from your health care provider about how to start taking your first cycle of OCPs. Your health care provider may recommend that you:  Start the pill on day 1 of your menstrual period. If you start at this time, you will not need any backup form of birth control (contraception), such as condoms.  Start the pill on the first Sunday after your menstrual period or on the day you get your prescription. In these cases, you will need to use backup contraception for the first week.  Start the pill at any time of your cycle. ? If you take the pill within 5 days of the start of your period, you will not need a backup form of contraception. ? If you start at any other time of your menstrual cycle, you will need to use another form of contraception for 7 days. If your OCP is the type called a minipill, it will protect you from pregnancy after taking it for 2 days (48 hours), and you can stop using  backup contraception after that time. After you have started taking OCPs:  If you forget to take 1 pill, take it as soon as you remember. Take the next pill at the regular time.  If you miss 2 or more pills, call your health care provider. Different pills have different instructions for missed doses. Use backup birth control until your next menstrual period starts.  If you use a 28-day pack that contains inactive pills and you miss 1 of the last 7 pills (pills with no hormones), throw away the rest of the non-hormone pills and start a new pill pack. No matter which day you start the OCP, you will always start a new pack on that same day of the week. Have an extra pack of OCPs and a backup contraceptive method available in case you miss some pills or lose your OCP pack. Follow these instructions at home:  Do not use any products that contain nicotine or tobacco, such as cigarettes and e-cigarettes. If you need help quitting, ask your health care provider.  Always use a condom to protect against STIs. OCPs do not protect against STIs.  Use a calendar to mark the days of your menstrual period.  Read the information and directions that came with your OCP. Talk to your health care provider if you have questions. Contact a health care provider if:  You develop nausea and vomiting.  You have abnormal vaginal discharge or bleeding.  You develop a rash.    You miss your menstrual period. Depending on the type of OCP you are taking, this may be a sign of pregnancy. Ask your health care provider for more information.  You are losing your hair.  You need treatment for mood swings or depression.  You get dizzy when taking the OCP.  You develop acne after taking the OCP.  You become pregnant or think you may be pregnant.  You have diarrhea, constipation, and abdominal pain or cramps.  You miss 2 or more pills. Get help right away if:  You develop chest pain.  You develop shortness of  breath.  You have an uncontrolled or severe headache.  You develop numbness or slurred speech.  You develop visual or speech problems.  You develop pain, redness, and swelling in your legs.  You develop weakness or numbness in your arms or legs. Summary  Oral contraceptive pills (OCPs) are medicines that you take to prevent pregnancy.  OCPs do not prevent sexually transmitted infections (STIs). Always use a condom to protect against STIs.  When you start an OCP, be aware that it can take 2-3 months for your body to adjust to changes in hormone levels.  Read all the information and directions that come with your OCP. This information is not intended to replace advice given to you by your health care provider. Make sure you discuss any questions you have with your health care provider. Document Revised: 03/08/2019 Document Reviewed: 12/26/2016 Elsevier Patient Education  [employer]. Emergency Contraception Emergency contraception refers to birth control methods that prevent pregnancy after unprotected sex. Emergency contraception may be recommended:  When a condom breaks.  After a sexual assault.  If you forgot to take your birth control pill.  If you used inadequate contraception during sex. Emergency contraception is most effective if used as soon as possible after sex. It is important to note that it does not protect against STIs (sexually transmitted infections). Do not use emergency contraception as your only form of birth control. Types of emergency contraceptives Emergency contraception must be used within 5 days after having unprotected sex. The following types of emergency contraception are available:  Hormonal pills that work by preventing the ovaries from releasing an egg (ovulation) and preventing fertilization of an egg. There are two types of hormonal pills: ? A pill that contains high doses of estrogen and progesterone. ? A pill that contains only  progesterone. This may be a single pill or two pills taken 12-24 hours apart.  A non-hormonal pill that works by preventing progesterone from having its normal effect on ovulation and the lining of the uterus (endometrium). A prescription for this medicine is usually required.  A non-hormonal medical device that is inserted into the uterus (copper intrauterine device, IUD). The copper in the IUD causes the sperm to be less able to fertilize the egg. A health care provider must insert the IUD. Most types of emergency contraceptives are available without a prescription or a visit with your health care provider. If you are younger than 40, you may need a prescription. Talk with your pharmacist about your options. Side effects Ask your health care provider about the possible side effects of emergency contraceptives. Side effects may include:  Abdominal pain and cramps.  Nausea and vomiting.  Breast tenderness.  Headache.  Dizziness.  Fatigue.  Irregular bleeding or spotting. If you take emergency contraception while you are pregnant, it will not end your pregnancy or harm your baby. Follow these instructions at home:  Take over-the-counter and prescription medicines only as told by your health care provider.  Eat something before taking the emergency contraception pills. This can help prevent nausea.  If you feel tired or dizzy, rest until you feel better.  Continue using your normal method of birth control. Contact a health care provider if:  You vomit within 2 hours after taking a pill. You may need to take another pill.  You have a severe headache.  You have bleeding that does not stop.  It has been 21 days since you took an emergency contraception pill and you have not had a menstrual period. Get help right away if:  You have chest pain.  You have leg pain.  You have numbness or weakness of your arms or legs.  You have slurred speech.  You have visual  problems. Summary  Emergency contraceptives prevent pregnancy after unprotected sex.  Emergency contraception will not work if you are already pregnant and will not harm the baby if you are pregnant.  Some emergency contraceptives are available from your local pharmacy without a prescription.  Talk to your health care provider about the type of emergency contraceptives that are best for you. This information is not intended to replace advice given to you by your health care provider. Make sure you discuss any questions you have with your health care provider. Document Revised: 10/27/2017 Document Reviewed: 12/27/2016 Elsevier Patient Education  2020 [employer].

## 2020-03-18 NOTE — Progress Notes (Signed)
History:  Ms. Adriana [name] is a 24 y.o. [id number] who presents to clinic today for initiation of birth control method.  Patient missed her postpartum visit and now desires contraception. Patient reports that she is doing well since delivery and without concerns.  Infant present.  Patient reports that she has been experiencing some vaginal itching and burning with sexual activity x one week.  Patient discloses that her [name] was having sexual encounters with another woman.  However, patient reports safety at home and good social support.  Patient states that she has been sexually active with partner and has not used condoms or withdrawal method.  Patient reports last intercourse was yesterday.     The following portions of the patient's history were reviewed and updated as appropriate: allergies, current medications, family history, past medical history, social history, past surgical history and problem list.  Review of Systems:  Review of Systems  Constitutional: Negative for chills and fever.  Gastrointestinal: Negative for constipation and diarrhea.  Genitourinary: Negative for dysuria and urgency.  Psychiatric/Behavioral: Negative for depression.      Objective:  Physical Exam BP 128/84   Pulse 84   Ht 5\' 6"  (1.676 m)   Wt 178 lb (80.7 kg)   LMP  (LMP Unknown)   Breastfeeding Yes   BMI 28.73 kg/m  Physical Exam Vitals reviewed.  Constitutional:      General: She is not in acute distress.    Appearance: Normal appearance. She is not ill-appearing.  HENT:     Head: Normocephalic and atraumatic.  Eyes:     Conjunctiva/sclera: Conjunctivae normal.  Cardiovascular:     Rate and Rhythm: Normal rate and regular rhythm.     Heart sounds: Normal heart sounds.  Pulmonary:     Effort: Pulmonary effort is normal. No respiratory distress.     Breath sounds: Normal breath sounds.  Abdominal:     General: Bowel sounds are normal.     Palpations: Abdomen is soft.     Tenderness: There is  no abdominal tenderness.  Musculoskeletal:        General: Normal range of motion.     Cervical back: Normal range of motion.  Skin:    General: Skin is warm and dry.  Neurological:     Mental Status: She is alert and oriented to person, place, and time.  Psychiatric:        Mood and Affect: Mood normal.        Thought Content: Thought content normal.        Judgment: Judgment normal.      Labs and Imaging No results found for this or any previous visit (from the past 24 hour(s)).  No results found.   Assessment & Plan:  24 year old  [id number]  1. Unprotected sexual intercourse -STD testing today via vaginal swab. -Will also test for vaginal infections. -Rx for plan b one step sent to pharmacy.  Patient instructed to take today and start OCPs tomorrow. -Will send results via mychart.  - POCT urine pregnancy - Cervicovaginal ancillary only( [facility])  2. Encounter for initial prescription of contraceptive pills -Reviewed usage of contraception pill during breastfeeding. -Discussed risks and benefits when compared to Nexplanon. -Patient desires oral contraception for fear of Nexplanon placement despite provider reassurances. -Given samples of Slynd (3) and instructed to take daily. -Encouraged to repeat UPT on Friday May 7th. -If desires to continue OCPs instructed to call or send mychart message to office for refill.  -  [facility] pregnancy - [facility])   Gerrit Heck, [place] 03/18/2020 2:43 PM

## 2020-03-18 NOTE — Progress Notes (Signed)
GYN presents for [facility] consult.  Had baby two months ago.  LMP unknown.  Last unprotected sex 6 days ago, Friday 03/13/2020.  C/o malodorous, yellow discharge x4 weeks, abdominal pain 5/10 x 4 weeks, nausea. Denies fever, chills.  UPT today is Negative.

## 2020-03-20 LAB — CERVICOVAGINAL ANCILLARY ONLY
Bacterial Vaginitis (gardnerella): NEGATIVE
Candida Glabrata: NEGATIVE
Candida Vaginitis: POSITIVE — AB
Chlamydia: NEGATIVE
Comment: NEGATIVE
Comment: NEGATIVE
Comment: NEGATIVE
Comment: NEGATIVE
Comment: NEGATIVE
Comment: NORMAL
Neisseria Gonorrhea: NEGATIVE
Trichomonas: NEGATIVE

## 2020-03-24 ENCOUNTER — Other Ambulatory Visit: Payer: Self-pay

## 2020-03-24 DIAGNOSIS — B373 Candidiasis of vulva and vagina: Secondary | ICD-10-CM

## 2020-03-24 DIAGNOSIS — B3731 Acute candidiasis of vulva and vagina: Secondary | ICD-10-CM

## 2020-03-24 MED ORDER — FLUCONAZOLE 150 MG PO TABS: 150.0000 mg | ORAL_TABLET | Freq: Every day | ORAL | 0 refills | Status: DC

## 2020-06-04 ENCOUNTER — Ambulatory Visit: Payer: Medicaid Other | Admitting: Obstetrics

## 2020-06-06 ENCOUNTER — Other Ambulatory Visit: Payer: Self-pay

## 2020-06-06 ENCOUNTER — Ambulatory Visit
Admission: EM | Admit: 2020-06-06 | Discharge: 2020-06-06 | Disposition: A | Discharge status: Home / Self Care | Payer: Medicaid Other | Attending: Physician Assistant | Admitting: Physician Assistant

## 2020-06-06 DIAGNOSIS — N898 Other specified noninflammatory disorders of vagina: Secondary | ICD-10-CM | POA: Insufficient documentation

## 2020-06-06 LAB — POCT URINE PREGNANCY: Preg Test, Ur: NEGATIVE

## 2020-06-06 MED ORDER — FLUCONAZOLE 150 MG PO TABS: 150.0000 mg | ORAL_TABLET | Freq: Every day | ORAL | 0 refills | Status: DC

## 2020-06-06 NOTE — ED Provider Notes (Signed)
[facility]    CSN: [id number] Arrival date & time: 06/06/20  1208      History   Chief Complaint Chief Complaint  Patient presents with   Vaginal Discharge    HPI Adriana [name] is a 24 y.o. female.   24 year old female comes in for 1 week history of vaginal discharge. Also with burning to area and itching. Denies vaginal odor, spotting. Having lower abdominal cramping that she associated with expected onset of menstrual cycle. No nausea, vomiting, fevers. Sexually active with one female partner, no condom use. Was trying OCP but self discontinued.      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  No hx of oral or genital outbreaks. Recommend prophylaxis in any future pregnancies.   Migraines     Patient Active Problem List   Diagnosis Date Noted   Migraine headache with aura 04/16/2014    Past Surgical History:  Procedure Laterality Date   HERNIA REPAIR     as an infant    OB History    Gravida  3   Para  3   Term  1   Preterm  2   AB      Living  2     SAB      TAB      Ectopic      Multiple  0   Live Births  2            Home Medications    Prior to Admission medications   Medication Sig Start Date End Date Taking? Authorizing Provider  Blood Pressure Monitoring (BLOOD PRESSURE KIT) DEVI 1 kit by Does not apply route once a week. Check BP Weekly.  Large Cuff.  Dx:Z13.6  Z34.86  O09.0 08/15/19   EClarnce Flock MD  Drospirenone 4 MG TABS Take 1 tablet by mouth daily. 03/18/20   EGavin Pound CNM  Fe Fum-FePoly-Vit C-Vit B3 (INTEGRA) 62.5-62.5-40-3 MG CAPS Take 1 capsule by mouth daily. 11/19/19   Leftwich-Kirby, LKathie Dike CNM  fluconazole (DIFLUCAN) 150 MG tablet Take 1 tablet (150 mg total) by mouth  daily. Take second dose 72 hours later if symptoms still persists. 06/06/20   YTasia Catchings Gwyn Mehring V, PA-C  ibuprofen (ADVIL) 600 MG tablet Take 1 tablet (600 mg total) by mouth every 8 (eight) hours as needed for mild pain. Patient not taking: Reported on 03/18/2020 01/18/20   DLaury Deep CNM  Prenatal Vit-Fe Fumarate-FA (PRENATAL MULTIVITAMIN) TABS tablet Take 1 tablet by mouth daily at 12 noon.    [provider]    Family History Family History  Problem Relation Age of Onset   Healthy Mother    Healthy Father     Social History Social History   Tobacco Use   Smoking status: Former Smoker    Quit date: 06/30/2016    Years since quitting: 3.9   Smokeless tobacco: Never Used  Vaping Use   Vaping Use: Never used  Substance Use Topics   Alcohol use: No    Alcohol/week: 0.0 standard drinks   Drug use: No     Allergies   Imitrex [sumatriptan] and Latex   Review of Systems Review of Systems  Reason unable to perform ROS: See HPI as above.  Physical Exam Triage Vital Signs ED Triage Vitals [06/06/20 1216]  Enc Vitals Group     BP 111/75     Pulse Rate 85     Resp 15     Temp 98.2 F (36.8 C)     Temp Source Oral     SpO2 97 %     Weight      Height      Head Circumference      Peak Flow      Pain Score      Pain Loc      Pain Edu?      Excl. in [place]?    No data found.  Updated Vital Signs BP 111/75 (BP Location: Left Arm)    Pulse 85    Temp 98.2 F (36.8 C) (Oral)    Resp 15    LMP 05/19/2020 (Exact Date)    SpO2 97%    Breastfeeding No   Physical Exam Constitutional:      General: She is not in acute distress.    Appearance: She is well-developed. She is not ill-appearing, toxic-appearing or diaphoretic.  HENT:     Head: Normocephalic and atraumatic.  Eyes:     Conjunctiva/sclera: Conjunctivae normal.     Pupils: Pupils are equal, round, and reactive to light.  Cardiovascular:     Rate and Rhythm: Normal rate and regular rhythm.    Pulmonary:     Effort: Pulmonary effort is normal. No respiratory distress.     Comments: LCTAB Abdominal:     General: Bowel sounds are normal.     Palpations: Abdomen is soft.     Tenderness: There is no abdominal tenderness. There is no right CVA tenderness, left CVA tenderness, guarding or rebound.  Musculoskeletal:     Cervical back: Normal range of motion and neck supple.  Skin:    General: Skin is warm and dry.  Neurological:     Mental Status: She is alert and oriented to person, place, and time.  Psychiatric:        Behavior: Behavior normal.        Judgment: Judgment normal.      UC Treatments / Results  Labs (all labs ordered are listed, but only abnormal results are displayed) Labs Reviewed  POCT URINE PREGNANCY  CERVICOVAGINAL ANCILLARY ONLY    EKG   Radiology No results found.  Procedures Procedures (including critical care time)  Medications Ordered in UC Medications - No data to display  Initial Impression / Assessment and Plan / UC Course  I have reviewed the triage vital signs and the nursing notes.  Pertinent labs & imaging results that were available during my care of the patient were reviewed by me and considered in my medical decision making (see chart for details).    Abdomen soft, +BS, nontender to palpation. Urine preg negative. Patient was treated empirically for yeast, diflucan as directed. Cytology sent, patient will be contacted with any positive results that require additional treatment. Patient to refrain from sexual activity for the next 7 days. Return precautions given.   Final Clinical Impressions(s) / UC Diagnoses   Final diagnoses:  Vaginal discharge    ED Prescriptions    Medication Sig Dispense Auth. Provider   fluconazole (DIFLUCAN) 150 MG tablet Take 1 tablet (150 mg total) by mouth daily. Take second dose 72 hours later if symptoms still persists. 2 tablet Ok Edwards, PA-C     PDMP not reviewed this encounter.  Ok Edwards, PA-C 06/06/20 1243

## 2020-06-06 NOTE — Discharge Instructions (Signed)
You were treated empirically for yeast. Diflucan as directed. Cytology sent, you will be contacted with any positive results that requires further treatment. Refrain from sexual activity for the next 7 days. Monitor for any worsening of symptoms, fever, abdominal pain, nausea, vomiting, to follow up for reevaluation.

## 2020-06-06 NOTE — ED Triage Notes (Signed)
Patient here with c/o vaginal discharge with burning and itching accompanied by lower abdominal pain x one week.

## 2020-06-09 LAB — CERVICOVAGINAL ANCILLARY ONLY
Chlamydia: NEGATIVE
Comment: NEGATIVE
Comment: NEGATIVE
Comment: NORMAL
Neisseria Gonorrhea: NEGATIVE
Trichomonas: NEGATIVE

## 2020-10-19 ENCOUNTER — Ambulatory Visit: Payer: Medicaid Other

## 2020-12-01 ENCOUNTER — Ambulatory Visit: Admission: EM | Admit: 2020-12-01 | Discharge status: Against Medical Advice | Payer: Medicaid Other

## 2020-12-03 ENCOUNTER — Telehealth: Payer: Self-pay | Admitting: Obstetrics

## 2020-12-03 MED ORDER — METRONIDAZOLE 500 MG PO TABS: 500.0000 mg | ORAL_TABLET | Freq: Two times a day (BID) | ORAL | 0 refills | Status: DC

## 2020-12-03 MED ORDER — FLUCONAZOLE 150 MG PO TABS: 150.0000 mg | ORAL_TABLET | Freq: Once | ORAL | 0 refills | Status: AC

## 2020-12-03 NOTE — Telephone Encounter (Signed)
Patient called requesting Rx for BV.  She has had BV in the past and feels these are same symptoms.  She is also requesting a diflucan to take at the end of her antibiotic as she always gets a yeast infection following antibiotic treatment.   Rx for flagyl and diflucan routed to pharmacy per protocol.

## 2020-12-04 ENCOUNTER — Ambulatory Visit: Payer: Medicaid Other

## 2021-02-21 ENCOUNTER — Ambulatory Visit
Admission: EM | Admit: 2021-02-21 | Discharge: 2021-02-21 | Disposition: A | Discharge status: Home / Self Care | Payer: Medicaid Other | Attending: Physician Assistant | Admitting: Physician Assistant

## 2021-02-21 ENCOUNTER — Other Ambulatory Visit: Payer: Self-pay

## 2021-02-21 DIAGNOSIS — B379 Candidiasis, unspecified: Secondary | ICD-10-CM

## 2021-02-21 MED ORDER — FLUCONAZOLE 150 MG PO TABS: 150.0000 mg | ORAL_TABLET | Freq: Every day | ORAL | 1 refills | Status: DC

## 2021-02-21 NOTE — ED Triage Notes (Signed)
Pt thinks that she may have a yeast infection, states she has irritation. She is concerned that she left a tampon in too long, not concerned for stds

## 2021-02-21 NOTE — Discharge Instructions (Signed)
Return if any problems.

## 2021-02-21 NOTE — ED Provider Notes (Signed)
[facility]    CSN: [id number] Arrival date & time: 02/21/21  0856      History   Chief Complaint Chief Complaint  Patient presents with  . Vaginal Discharge    HPI Adriana [name] is a 25 y.o. female.   The history is provided by the patient. No language interpreter was used.  Vaginal Discharge Quality:  White Severity:  Moderate Onset quality:  Gradual Timing:  Constant Progression:  Worsening Chronicity:  New Relieved by:  Nothing Worsened by:  Nothing Ineffective treatments:  None tried Associated symptoms: no fever and no vomiting   Risk factors: no STI exposure     Past Medical History:  Diagnosis Date  . History of IUFD 01/16/2020  . History of preterm delivery 08/14/2019   First pregnancy PPROM with delivery at 56w5dOffered Makena--> patient declined  . 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  No hx of oral or genital outbreaks. Recommend prophylaxis in any future pregnancies.  . Migraines     Patient Active Problem List   Diagnosis Date Noted  . Migraine headache with aura 04/16/2014    Past Surgical History:  Procedure Laterality Date  . HERNIA REPAIR     as an infant    OB History    Gravida  3   Para  3   Term  1   Preterm  2   AB      Living  2     SAB      IAB      Ectopic      Multiple  0   Live Births  2            Home Medications    Prior to Admission medications   Medication Sig Start Date End Date Taking? Authorizing Provider  fluconazole (DIFLUCAN) 150 MG tablet Take 1 tablet (150 mg total) by mouth daily. 02/21/21  Yes SFransico Meadow PA-C  Blood Pressure Monitoring (BLOOD PRESSURE KIT) DEVI 1 kit by Does not apply route once a week. Check BP Weekly.  Large Cuff.  Dx:Z13.6  Z34.86  O09.0 08/15/19   EClarnce Flock MD  Drospirenone 4 MG TABS Take 1 tablet by mouth daily. 03/18/20   EGavin Pound CNM  Fe Fum-FePoly-Vit C-Vit B3 (INTEGRA) 62.5-62.5-40-3 MG CAPS  Take 1 capsule by mouth daily. 11/19/19   Leftwich-Kirby, LKathie Dike CNM  ibuprofen (ADVIL) 600 MG tablet Take 1 tablet (600 mg total) by mouth every 8 (eight) hours as needed for mild pain. Patient not taking: Reported on 03/18/2020 01/18/20   DLaury Deep CNM  metroNIDAZOLE (FLAGYL) 500 MG tablet Take 1 tablet (500 mg total) by mouth 2 (two) times daily. 12/03/20   HShelly Bombard MD  Prenatal Vit-Fe Fumarate-FA (PRENATAL MULTIVITAMIN) TABS tablet Take 1 tablet by mouth daily at 12 noon.    [provider]    Family History Family History  Problem Relation Age of Onset  . Healthy Mother   . Healthy Father     Social History Social History   Tobacco Use  . Smoking status: Former Smoker    Quit date: 06/30/2016    Years since quitting: 4.6  . Smokeless tobacco: Never Used  Vaping Use  . Vaping Use: Never used  Substance Use Topics  . Alcohol use: No    Alcohol/week: 0.0 standard drinks  . Drug use: No     Allergies   Imitrex [sumatriptan] and Latex   Review  of Systems Review of Systems  Constitutional: Negative for fever.  Gastrointestinal: Negative for vomiting.  Genitourinary: Positive for vaginal discharge.  All other systems reviewed and are negative.    Physical Exam Triage Vital Signs ED Triage Vitals [02/21/21 0936]  Enc Vitals Group     BP 132/84     Pulse Rate 85     Resp 18     Temp 97.8 F (36.6 C)     Temp src      SpO2 98 %     Weight      Height      Head Circumference      Peak Flow      Pain Score      Pain Loc      Pain Edu?      Excl. in GC?    No data found.  Updated Vital Signs BP 132/84   Pulse 85   Temp 97.8 F (36.6 C)   Resp 18   LMP 02/08/2021   SpO2 98%   Visual Acuity Right Eye Distance:   Left Eye Distance:   Bilateral Distance:    Right Eye Near:   Left Eye Near:    Bilateral Near:     Physical Exam Vitals reviewed.  Constitutional:      Appearance: Normal appearance.  Cardiovascular:     Rate  and Rhythm: Normal rate.     Pulses: Normal pulses.  Pulmonary:     Effort: Pulmonary effort is normal.  Abdominal:     General: Abdomen is flat.  Musculoskeletal:        General: Normal range of motion.  Skin:    General: Skin is warm.  Neurological:     General: No focal deficit present.     Mental Status: She is alert.  Psychiatric:        Mood and Affect: Mood normal.      UC Treatments / Results  Labs (all labs ordered are listed, but only abnormal results are displayed) Labs Reviewed - No data to display  EKG   Radiology No results found.  Procedures Procedures (including critical care time)  Medications Ordered in UC Medications - No data to display  Initial Impression / Assessment and Plan / UC Course  I have reviewed the triage vital signs and the nursing notes.  Pertinent labs & imaging results that were available during my care of the patient were reviewed by me and considered in my medical decision making (see chart for details).     MDM:  Pt given rx for diflucan  Final Clinical Impressions(s) / UC Diagnoses   Final diagnoses:  Yeast infection     Discharge Instructions     Return if any problems.    ED Prescriptions    Medication Sig Dispense Auth. Provider   fluconazole (DIFLUCAN) 150 MG tablet Take 1 tablet (150 mg total) by mouth daily. 1 tablet Sofia, Leslie K, PA-C     PDMP not reviewed this encounter.  An After Visit Summary was printed and given to the patient.    Sofia, Leslie K, PA-C 02/21/21 1121  

## 2021-03-24 ENCOUNTER — Ambulatory Visit: Payer: Medicaid Other | Admitting: Obstetrics

## 2021-03-25 ENCOUNTER — Ambulatory Visit
Admission: EM | Admit: 2021-03-25 | Discharge: 2021-03-25 | Disposition: A | Discharge status: Home / Self Care | Payer: Medicaid Other | Attending: Nurse Practitioner | Admitting: Nurse Practitioner

## 2021-03-25 ENCOUNTER — Other Ambulatory Visit: Payer: Self-pay

## 2021-03-25 DIAGNOSIS — Z202 Contact with and (suspected) exposure to infections with a predominantly sexual mode of transmission: Secondary | ICD-10-CM | POA: Insufficient documentation

## 2021-03-25 DIAGNOSIS — N760 Acute vaginitis: Secondary | ICD-10-CM | POA: Diagnosis not present

## 2021-03-25 LAB — POCT URINE PREGNANCY: Preg Test, Ur: NEGATIVE

## 2021-03-25 MED ORDER — FLUCONAZOLE 150 MG PO TABS: 150.0000 mg | ORAL_TABLET | ORAL | 0 refills | Status: AC

## 2021-03-25 MED ORDER — METRONIDAZOLE 500 MG PO TABS: 500.0000 mg | ORAL_TABLET | Freq: Two times a day (BID) | ORAL | 0 refills | Status: DC

## 2021-03-25 NOTE — ED Triage Notes (Signed)
Pt c/o white thick discharge since Monday. Denies odor.

## 2021-03-25 NOTE — Discharge Instructions (Addendum)
Take medications as prescribed for possible bacterial or yeast infection. You have elected to wait for the results of your test for any further treatment. Do not drink alcohol while taking these medications Abstain from sexual intercourse until symptoms resolve and all test results have been received

## 2021-03-25 NOTE — ED Provider Notes (Signed)
[facility]    CSN: [id number] Arrival date & time: 03/25/21  1131      History   Chief Complaint Chief Complaint  Patient presents with  . SEXUALLY TRANSMITTED DISEASE    HPI Dareen Gutzwiller is a 25 y.o. female.   Subjective:   Harika Laidlaw is a 25 y.o. female who presents for evaluation of yellow and thick vaginal discharge. Symptoms have been present for 3 days. She denies any burning, lesions, local irritation, odor, vulvar itching or dysuria. Menstrual pattern: bleeding regularly. Contraception: none.  Patient is sexually active with 1 female partner. She reports possible STI exposure as her partner has been unfaithful in the past.  She does have a history of STIs in the past as well.  The following portions of the patient's history were reviewed and updated as appropriate: allergies, current medications, past family history, past medical history, past social history, past surgical history and problem list.       Past Medical History:  Diagnosis Date  . History of IUFD 01/16/2020  . History of preterm delivery 08/14/2019   First pregnancy PPROM with delivery at 69w5dOffered Makena--> patient declined  . 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  No hx of oral or genital outbreaks. Recommend prophylaxis in any future pregnancies.  . Migraines     Patient Active Problem List   Diagnosis Date Noted  . Migraine headache with aura 04/16/2014    Past Surgical History:  Procedure Laterality Date  . HERNIA REPAIR     as an infant    OB History    Gravida  3   Para  3   Term  1   Preterm  2   AB      Living  2     SAB      IAB      Ectopic      Multiple  0   Live Births  2            Home Medications    Prior to Admission medications   Medication Sig Start Date End Date Taking? Authorizing Provider  fluconazole (DIFLUCAN) 150 MG tablet Take 1 tablet (150 mg total) by mouth every 3 (three) days for 2  doses. 03/25/21 03/29/21 Yes MEnrique Sack FNP  metroNIDAZOLE (FLAGYL) 500 MG tablet Take 1 tablet (500 mg total) by mouth 2 (two) times daily. 03/25/21  Yes MEnrique Sack FNP  Blood Pressure Monitoring (BLOOD PRESSURE KIT) DEVI 1 kit by Does not apply route once a week. Check BP Weekly.  Large Cuff.  Dx:Z13.6  Z34.86  O09.0 08/15/19   EClarnce Flock MD    Family History Family History  Problem Relation Age of Onset  . Healthy Mother   . Healthy Father     Social History Social History   Tobacco Use  . Smoking status: Former Smoker    Quit date: 06/30/2016    Years since quitting: 4.7  . Smokeless tobacco: Never Used  Vaping Use  . Vaping Use: Never used  Substance Use Topics  . Alcohol use: No    Alcohol/week: 0.0 standard drinks  . Drug use: No     Allergies   Imitrex [sumatriptan] and Latex   Review of Systems Review of Systems  Constitutional: Negative.   Gastrointestinal: Negative.   Genitourinary: Positive for vaginal discharge. Negative for dysuria, flank pain, frequency, genital sores, hematuria and menstrual problem.  All other systems reviewed and are  negative.    Physical Exam Triage Vital Signs ED Triage Vitals  Enc Vitals Group     BP 03/25/21 1448 (!) 143/93     Pulse Rate 03/25/21 1448 99     Resp 03/25/21 1448 18     Temp 03/25/21 1448 98.3 F (36.8 C)     Temp src --      SpO2 03/25/21 1448 97 %     Weight --      Height --      Head Circumference --      Peak Flow --      Pain Score 03/25/21 1450 0     Pain Loc --      Pain Edu? --      Excl. in [place]? --    No data found.  Updated Vital Signs BP (!) 143/93 (BP Location: Left Arm)   Pulse 99   Temp 98.3 F (36.8 C)   Resp 18   LMP 03/15/2021   SpO2 97%   Breastfeeding No   Visual Acuity Right Eye Distance:   Left Eye Distance:   Bilateral Distance:    Right Eye Near:   Left Eye Near:    Bilateral Near:     Physical Exam Vitals reviewed.  Constitutional:       Appearance: Normal appearance.  HENT:     Head: Normocephalic.     Nose: Nose normal.  Cardiovascular:     Rate and Rhythm: Normal rate and regular rhythm.  Pulmonary:     Effort: Pulmonary effort is normal.  Musculoskeletal:        General: Normal range of motion.     Cervical back: Normal range of motion and neck supple.  Skin:    General: Skin is warm and dry.  Neurological:     General: No focal deficit present.     Mental Status: She is alert and oriented to person, place, and time.      UC Treatments / Results  Labs (all labs ordered are listed, but only abnormal results are displayed) Labs Reviewed  POCT URINE PREGNANCY  CERVICOVAGINAL ANCILLARY ONLY    EKG   Radiology No results found.  Procedures Procedures (including critical care time)  Medications Ordered in UC Medications - No data to display  Initial Impression / Assessment and Plan / UC Course  I have reviewed the triage vital signs and the nursing notes.  Pertinent labs & imaging results that were available during my care of the patient were reviewed by me and considered in my medical decision making (see chart for details).    25 year old female presenting with a 3-day history of vaginal discharge.  She denies any burning, lesions, local irritation, odor, vulvar itching or dysuria.  Menstrual pattern is regular.  She does not use any contraception.  She is sexually active with 1 female partner and reports a possible STI exposure.  She does have a history of STIs.  Patient is afebrile.  Alert and oriented x3.  Urine pregnancy negative.  Testing for bacterial vaginosis, yeast, trichomonas, gonorrhea and chlamydia pending.  Will empirically treat with Flagyl and Diflucan.  Patient has declined empirical treatment for GC/ chlamydia and would rather wait for those results instead.  Patient advised to abstain from intercourse until symptoms resolve and test results are available.  Safe sex practices discussed as  well.  Today's evaluation has revealed no signs of a dangerous process. Discussed diagnosis with patient and/or guardian. Patient and/or guardian aware of  their diagnosis, possible red flag symptoms to watch out for and need for close follow up. Patient and/or guardian understands verbal and written discharge instructions. Patient and/or guardian comfortable with plan and disposition.  Patient and/or guardian has a clear mental status at this time, good insight into illness (after discussion and teaching) and has clear judgment to make decisions regarding their care  This care was provided during an unprecedented National Emergency due to the Novel Coronavirus (COVID-19) pandemic. COVID-19 infections and transmission risks place heavy strains on healthcare resources.  As this pandemic evolves, our facility, providers, and staff strive to respond fluidly, to remain operational, and to provide care relative to available resources and information. Outcomes are unpredictable and treatments are without well-defined guidelines. Further, the impact of COVID-19 on all aspects of urgent care, including the impact to patients seeking care for reasons other than COVID-19, is unavoidable during this national emergency. At this time of the global pandemic, management of patients has significantly changed, even for non-COVID positive patients given high local and regional COVID volumes at this time requiring high healthcare system and resource utilization. The standard of care for management of both COVID suspected and non-COVID suspected patients continues to change rapidly at the local, regional, national, and global levels. This patient was worked up and treated to the best available but ever changing evidence and resources available at this current time.   Documentation was completed with the aid of voice recognition software. Transcription may contain typographical errors.  Final Clinical Impressions(s) / UC  Diagnoses   Final diagnoses:  Vaginitis and vulvovaginitis  Possible exposure to STD     Discharge Instructions     . Take medications as prescribed for possible bacterial or yeast infection. . You have elected to wait for the results of your test for any further treatment. . Do not drink alcohol while taking these medications . Abstain from sexual intercourse until symptoms resolve and all test results have been received    ED Prescriptions    Medication Sig Dispense Auth. Provider   fluconazole (DIFLUCAN) 150 MG tablet Take 1 tablet (150 mg total) by mouth every 3 (three) days for 2 doses. 2 tablet Enrique Sack, FNP   metroNIDAZOLE (FLAGYL) 500 MG tablet Take 1 tablet (500 mg total) by mouth 2 (two) times daily. 14 tablet Enrique Sack, FNP     PDMP not reviewed this encounter.   Enrique Sack, [place] 03/25/21 [id number]

## 2021-03-26 LAB — CERVICOVAGINAL ANCILLARY ONLY
Bacterial Vaginitis (gardnerella): NEGATIVE
Candida Glabrata: NEGATIVE
Candida Vaginitis: POSITIVE — AB
Chlamydia: NEGATIVE
Comment: NEGATIVE
Comment: NEGATIVE
Comment: NEGATIVE
Comment: NEGATIVE
Comment: NEGATIVE
Comment: NORMAL
Neisseria Gonorrhea: NEGATIVE
Trichomonas: NEGATIVE

## 2021-03-29 ENCOUNTER — Encounter: Payer: Self-pay | Admitting: Obstetrics

## 2021-03-29 ENCOUNTER — Ambulatory Visit: Payer: Medicaid Other | Admitting: Obstetrics

## 2021-03-29 ENCOUNTER — Telehealth (INDEPENDENT_AMBULATORY_CARE_PROVIDER_SITE_OTHER): Payer: Medicaid Other | Admitting: Obstetrics

## 2021-03-29 DIAGNOSIS — Z3009 Encounter for other general counseling and advice on contraception: Secondary | ICD-10-CM

## 2021-03-29 DIAGNOSIS — G43109 Migraine with aura, not intractable, without status migrainosus: Secondary | ICD-10-CM

## 2021-03-29 NOTE — Progress Notes (Signed)
GYNECOLOGY VIRTUAL VISIT ENCOUNTER NOTE  Provider location: [facility]   Patient location: Home  I connected with Adriana [name] on 03/29/21 at 10:45 AM EDT by MyChart Video Encounter and verified that I am speaking with the correct person using two identifiers.   I discussed the limitations, risks, security and privacy concerns of performing an evaluation and management service virtually and the availability of in person appointments. I also discussed with the patient that there may be a patient responsible charge related to this service. The patient expressed understanding and agreed to proceed.   History:  Adriana [name] is a 25 y.o. [phone] female being evaluated today for contraceptive counseling. She denies any abnormal vaginal discharge, bleeding, pelvic pain or other concerns.       Past Medical History:  Diagnosis Date  . History of IUFD 01/16/2020  . History of preterm delivery 08/14/2019   First pregnancy PPROM with delivery at [redacted]w[redacted]d Offered Makena--> patient declined  . 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  No hx of oral or genital outbreaks. Recommend prophylaxis in any future pregnancies.  Marland Kitchen Migraines    Past Surgical History:  Procedure Laterality Date  . HERNIA REPAIR     as an infant   The following portions of the patient's history were reviewed and updated as appropriate: allergies, current medications, past family history, past medical history, past social history, past surgical history and problem list.   Health Maintenance:  Normal pap and negative HRHPV on 04-13-2019.    Review of Systems:  Pertinent items noted in HPI and remainder of comprehensive ROS otherwise negative.  Physical Exam:   General:  Alert, oriented and cooperative. Patient appears to be in no acute distress.  Mental Status: Normal mood and affect. Normal behavior. Normal judgment and thought content.   Respiratory: Normal  respiratory effort, no problems with respiration noted  Rest of physical exam deferred due to type of encounter  Labs and Imaging Results for orders placed or performed during the hospital encounter of 03/25/21 (from the past 336 hour(s))  POCT urine pregnancy   Collection Time: 03/25/21  3:02 PM  Result Value Ref Range   Preg Test, Ur Negative Negative  Cervicovaginal ancillary only   Collection Time: 03/25/21  3:02 PM  Result Value Ref Range   Neisseria Gonorrhea Negative    Chlamydia Negative    Trichomonas Negative    Bacterial Vaginitis (gardnerella) Negative    Candida Vaginitis Positive (A)    Candida Glabrata Negative    Comment Normal Reference Range Candida Species - Negative    Comment Normal Reference Range Candida Galbrata - Negative    Comment Normal Reference Range Trichomonas - Negative    Comment Normal Reference Ranger Chlamydia - Negative    Comment      Normal Reference Range Neisseria Gonorrhea - Negative   Comment      Normal Reference Range Bacterial Vaginosis - Negative   No results found.     Assessment and Plan:     1. Encounter for counseling regarding contraception - discussed recommendations for safe and effective contraception with a history of migraines with aura - discussed the risks, possible complications and benefits of hormonal and non-hormonal contraceptives - ParaGuard non-hormonal contraceptive recommended because of her history of migraines with aura, and she agreed  2. Migraine with aura and without status migrainosus, not intractable     I discussed the assessment and treatment plan with  the patient. The patient was provided an opportunity to ask questions and all were answered. The patient agreed with the plan and demonstrated an understanding of the instructions.   The patient was advised to call back or seek an in-person evaluation/go to the ED if the symptoms worsen or if the condition fails to improve as anticipated.  I have  spent a total of 15 minutes of face-to-face and non-face-to-face time, excluding clinical staff time, reviewing notes and preparing to see patient, ordering tests and/or medications, and counseling the patient.   Coral Ceo, MD [facility] 03/29/21

## 2021-03-29 NOTE — Progress Notes (Signed)
I connected with  Elenor Quinones on 03/29/21 by a video enabled telemedicine application and verified that I am speaking with the correct person using two identifiers.   I discussed the limitations of evaluation and management by telemedicine. The patient expressed understanding and agreed to proceed.  [facility] to discuss BC, she wants OCP.  Annual Ex is scheduled for 04/15/2021

## 2021-04-15 ENCOUNTER — Ambulatory Visit: Payer: Medicaid Other | Admitting: Obstetrics

## 2021-07-23 ENCOUNTER — Other Ambulatory Visit: Payer: Self-pay

## 2021-07-23 ENCOUNTER — Ambulatory Visit
Admission: EM | Admit: 2021-07-23 | Discharge: 2021-07-23 | Disposition: A | Discharge status: Home / Self Care | Payer: Medicaid Other | Attending: Internal Medicine | Admitting: Internal Medicine

## 2021-07-23 DIAGNOSIS — B373 Candidiasis of vulva and vagina: Secondary | ICD-10-CM | POA: Diagnosis present

## 2021-07-23 DIAGNOSIS — N898 Other specified noninflammatory disorders of vagina: Secondary | ICD-10-CM | POA: Diagnosis present

## 2021-07-23 DIAGNOSIS — B3731 Acute candidiasis of vulva and vagina: Secondary | ICD-10-CM

## 2021-07-23 LAB — POCT URINALYSIS DIP (MANUAL ENTRY)
Bilirubin, UA: NEGATIVE
Blood, UA: NEGATIVE
Glucose, UA: NEGATIVE mg/dL
Ketones, POC UA: NEGATIVE mg/dL
Nitrite, UA: NEGATIVE
Protein Ur, POC: NEGATIVE mg/dL
Spec Grav, UA: 1.02 (ref 1.010–1.025)
Urobilinogen, UA: 0.2 E.U./dL
pH, UA: 7 (ref 5.0–8.0)

## 2021-07-23 LAB — POCT URINE PREGNANCY: Preg Test, Ur: NEGATIVE

## 2021-07-23 MED ORDER — FLUCONAZOLE 150 MG PO TABS: 150.0000 mg | ORAL_TABLET | Freq: Every day | ORAL | 0 refills | Status: DC

## 2021-07-23 NOTE — Discharge Instructions (Addendum)
You urine did not show signs of infection.  Urine culture and STD swab are pending.  We will call if it is positive.  You have been prescribed Diflucan to take for possible yeast infection.

## 2021-07-23 NOTE — ED Provider Notes (Signed)
[facility]    CSN: [id number] Arrival date & time: 07/23/21  0856      History   Chief Complaint Chief Complaint  Patient presents with   Vaginal Discharge    HPI Adriana [name] is a 25 y.o. female.   Patient presents with white, curdy vaginal discharge which vaginal burning and itching.  Denies any pelvic pain, abdominal pain, back pain, fever.  Is having some mild urinary burning and frequency as well.  Last menstrual cycle was approximately 2 weeks ago.  Denies any known exposure to STD but has had unprotected sexual intercourse recently.  Patient is concerned for yeast infection as she had yeast infection in April, and these symptoms "feel similar".   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  No hx of oral or genital outbreaks. Recommend prophylaxis in any future pregnancies.   Migraines     Patient Active Problem List   Diagnosis Date Noted   Migraine headache with aura 04/16/2014    Past Surgical History:  Procedure Laterality Date   HERNIA REPAIR     as an infant    OB History     Gravida  3   Para  3   Term  1   Preterm  2   AB      Living  2      SAB      IAB      Ectopic      Multiple  0   Live Births  2            Home Medications    Prior to Admission medications   Medication Sig Start Date End Date Taking? Authorizing Provider  fluconazole (DIFLUCAN) 150 MG tablet Take 1 tablet (150 mg total) by mouth daily. 07/23/21  Yes FOdis Luster FNP  Blood Pressure Monitoring (BLOOD PRESSURE KIT) DEVI 1 kit by Does not apply route once a week. Check BP Weekly.  Large Cuff.  Dx:Z13.6  Z34.86  O09.0 08/15/19   EClarnce Flock MD  metroNIDAZOLE (FLAGYL) 500 MG tablet Take 1 tablet  (500 mg total) by mouth 2 (two) times daily. Patient not taking: Reported on 03/29/2021 03/25/21   MEnrique Sack FNP    Family History Family History  Problem Relation Age of Onset   Healthy Mother    Healthy Father     Social History Social History   Tobacco Use   Smoking status: Former    Types: Cigarettes    Quit date: 06/30/2016    Years since quitting: 5.0   Smokeless tobacco: Never  Vaping Use   Vaping Use: Never used  Substance Use Topics   Alcohol use: No    Alcohol/week: 0.0 standard drinks   Drug use: No     Allergies   Imitrex [sumatriptan] and Latex   Review of Systems Review of Systems Per HPI  Physical Exam Triage Vital Signs ED Triage Vitals  Enc Vitals Group     BP 07/23/21 0944 119/78     Pulse Rate 07/23/21 0944 76     Resp 07/23/21 0944 18     Temp 07/23/21 0944 97.8 F (36.6 C)     Temp Source 07/23/21 0944 Oral     SpO2 07/23/21 0944  97 %     Weight --      Height --      Head Circumference --      Peak Flow --      Pain Score 07/23/21 0945 0     Pain Loc --      Pain Edu? --      Excl. in [place]? --    No data found.  Updated Vital Signs BP 119/78 (BP Location: Right Arm)   Pulse 76   Temp 97.8 F (36.6 C) (Oral)   Resp 18   LMP 07/13/2021 (Approximate)   SpO2 97%   Visual Acuity Right Eye Distance:   Left Eye Distance:   Bilateral Distance:    Right Eye Near:   Left Eye Near:    Bilateral Near:     Physical Exam Constitutional:      Appearance: Normal appearance.  HENT:     Head: Normocephalic and atraumatic.  Eyes:     Extraocular Movements: Extraocular movements intact.     Conjunctiva/sclera: Conjunctivae normal.  Pulmonary:     Effort: Pulmonary effort is normal.  Genitourinary:    Comments: Deferred with shared decision making.  Self swab performed. Neurological:     General: No focal deficit present.     Mental Status: She is alert and oriented to person, place, and time. Mental status is at baseline.   Psychiatric:        Mood and Affect: Mood normal.        Behavior: Behavior normal.        Thought Content: Thought content normal.        Judgment: Judgment normal.     UC Treatments / Results  Labs (all labs ordered are listed, but only abnormal results are displayed) Labs Reviewed  POCT URINALYSIS DIP (MANUAL ENTRY) - Abnormal; Notable for the following components:      Result Value   Clarity, UA cloudy (*)    Leukocytes, UA Trace (*)    All other components within normal limits  URINE CULTURE  POCT URINE PREGNANCY  CERVICOVAGINAL ANCILLARY ONLY    EKG   Radiology No results found.  Procedures Procedures (including critical care time)  Medications Ordered in UC Medications - No data to display  Initial Impression / Assessment and Plan / UC Course  I have reviewed the triage vital signs and the nursing notes.  Pertinent labs & imaging results that were available during my care of the patient were reviewed by me and considered in my medical decision making (see chart for details).     Highly suspicious of vaginal yeast infection as patient has similar symptoms as in April when she tested positive for yeast on vaginal swab.  will treat with Diflucan.  Vaginal swab and urine culture pending.  Urinalysis not definitive for urinary tract infection. urine pregnancy negative.  Patient to refrain from sexual activity until treatment of test results are complete.  Discussed strict return precautions. Patient verbalized understanding and is agreeable with plan.  Final Clinical Impressions(s) / UC Diagnoses   Final diagnoses:  Vaginal discharge  Candidiasis of vagina     Discharge Instructions      You urine did not show signs of infection.  Urine culture and STD swab are pending.  We will call if it is positive.  You have been prescribed Diflucan to take for possible yeast infection.      ED Prescriptions     Medication Sig Dispense Auth. Provider  fluconazole  (DIFLUCAN) 150 MG tablet Take 1 tablet (150 mg total) by mouth daily. 1 tablet Odis Luster, FNP      PDMP not reviewed this encounter.   Odis Luster, FNP 07/23/21 1051

## 2021-07-23 NOTE — ED Triage Notes (Signed)
Pt c/o vaginal discharge described as "cottage cheese," burning and itching in the vagina, pelvic discomfort. States "I think I have a yeast infection" this happens frequently per patient. Her cycle was 2 weeks ago, symptoms started last week.

## 2021-07-24 LAB — URINE CULTURE: Culture: <10000 — AB

## 2021-07-26 LAB — CERVICOVAGINAL ANCILLARY ONLY
Bacterial Vaginitis (gardnerella): NEGATIVE
Candida Glabrata: NEGATIVE
Candida Vaginitis: NEGATIVE
Chlamydia: NEGATIVE
Comment: NEGATIVE
Comment: NEGATIVE
Comment: NEGATIVE
Comment: NEGATIVE
Comment: NEGATIVE
Comment: NORMAL
Neisseria Gonorrhea: NEGATIVE
Trichomonas: NEGATIVE

## 2021-09-03 ENCOUNTER — Other Ambulatory Visit: Payer: Self-pay

## 2021-09-03 ENCOUNTER — Ambulatory Visit
Admission: EM | Admit: 2021-09-03 | Discharge: 2021-09-03 | Disposition: A | Discharge status: Home / Self Care | Payer: Medicaid Other | Attending: Internal Medicine | Admitting: Internal Medicine

## 2021-09-03 DIAGNOSIS — N898 Other specified noninflammatory disorders of vagina: Secondary | ICD-10-CM | POA: Insufficient documentation

## 2021-09-03 DIAGNOSIS — Z113 Encounter for screening for infections with a predominantly sexual mode of transmission: Secondary | ICD-10-CM | POA: Diagnosis present

## 2021-09-03 LAB — POCT URINALYSIS DIP (MANUAL ENTRY)
Bilirubin, UA: NEGATIVE
Blood, UA: NEGATIVE
Glucose, UA: NEGATIVE mg/dL
Ketones, POC UA: NEGATIVE mg/dL
Nitrite, UA: NEGATIVE
Protein Ur, POC: NEGATIVE mg/dL
Spec Grav, UA: >=1.03 — AB (ref 1.010–1.025)
Urobilinogen, UA: 0.2 E.U./dL
pH, UA: 6.5 (ref 5.0–8.0)

## 2021-09-03 LAB — POCT URINE PREGNANCY: Preg Test, Ur: NEGATIVE

## 2021-09-03 NOTE — Discharge Instructions (Addendum)
Your lab work is pending.  We will call if it is positive and treat as appropriate.

## 2021-09-03 NOTE — ED Triage Notes (Signed)
Pt presents today for STD testing. Pt feels like she may have a yeast infection noting previous infection may not have cleared. She only took one dose of diflucan.  Confirms vaginal discharge and one week of vaginal odor.

## 2021-09-03 NOTE — ED Provider Notes (Signed)
[facility]    CSN: [id number] Arrival date & time: 09/03/21  1037      History   Chief Complaint Chief Complaint  Patient presents with   Vaginal Discharge   vaginal odor    HPI Adriana [name] is a 25 y.o. female.   Patient presents with white vaginal discharge that has been present for a few months.  Patient reports that she was seen on 07/23/2021 and was prescribed Diflucan.  Patient reports that she did take 1 pill of Diflucan with minimal improvement in symptoms.  Vaginal swab and urine culture were unremarkable at that visit.  Patient denies any pelvic pain, abdominal pain, fever, back pain, urinary burning, urinary frequency, irregular vaginal bleeding.  Denies any known exposure to STD.  Last menstrual cycle was approximately 1 month prior.   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  No hx of oral or genital outbreaks. Recommend prophylaxis in any future pregnancies.   Migraines     Patient Active Problem List   Diagnosis Date Noted   Migraine headache with aura 04/16/2014    Past Surgical History:  Procedure Laterality Date   HERNIA REPAIR     as an infant    OB History     Gravida  3   Para  3   Term  1   Preterm  2   AB      Living  2      SAB      IAB      Ectopic      Multiple  0   Live Births  2            Home Medications    Prior to Admission medications   Medication Sig Start Date End Date Taking? Authorizing Provider  Blood Pressure Monitoring (BLOOD PRESSURE KIT) DEVI 1 kit by Does not apply route once a week. Check BP Weekly.  Large Cuff.  Dx:Z13.6  Z34.86  O09.0 08/15/19   EClarnce Flock MD  fluconazole (DIFLUCAN) 150 MG tablet Take 1 tablet (150 mg total) by mouth  daily. 07/23/21   FOdis Luster FNP  metroNIDAZOLE (FLAGYL) 500 MG tablet Take 1 tablet (500 mg total) by mouth 2 (two) times daily. Patient not taking: No sig reported 03/25/21   MEnrique Sack FNP    Family History Family History  Problem Relation Age of Onset   Healthy Mother    Healthy Father     Social History Social History   Tobacco Use   Smoking status: Former    Types: Cigarettes    Quit date: 06/30/2016    Years since quitting: 5.1   Smokeless tobacco: Never  Vaping Use   Vaping Use: Never used  Substance Use Topics   Alcohol use: No    Alcohol/week: 0.0 standard drinks   Drug use: No     Allergies   Imitrex [sumatriptan] and Latex   Review of Systems Review of Systems Per HPI  Physical Exam Triage Vital Signs ED Triage Vitals  Enc Vitals Group     BP 09/03/21 1048 122/82     Pulse Rate 09/03/21 1048 82     Resp 09/03/21 1048 18     Temp 09/03/21 1048 98.1 F (36.7  C)     Temp Source 09/03/21 1048 Oral     SpO2 09/03/21 1048 97 %     Weight --      Height --      Head Circumference --      Peak Flow --      Pain Score 09/03/21 1051 0     Pain Loc --      Pain Edu? --      Excl. in [place]? --    No data found.  Updated Vital Signs BP 122/82 (BP Location: Left Arm)   Pulse 82   Temp 98.1 F (36.7 C) (Oral)   Resp 18   LMP 08/06/2021 (Exact Date)   SpO2 97%   Breastfeeding No   Visual Acuity Right Eye Distance:   Left Eye Distance:   Bilateral Distance:    Right Eye Near:   Left Eye Near:    Bilateral Near:     Physical Exam Constitutional:      Appearance: Normal appearance.  HENT:     Head: Normocephalic and atraumatic.  Eyes:     Extraocular Movements: Extraocular movements intact.     Conjunctiva/sclera: Conjunctivae normal.  Pulmonary:     Effort: Pulmonary effort is normal.  Genitourinary:    Comments: Deferred with shared decision making. Self swab performed. Neurological:     General: No focal deficit present.      Mental Status: She is alert and oriented to person, place, and time. Mental status is at baseline.  Psychiatric:        Mood and Affect: Mood normal.        Behavior: Behavior normal.        Thought Content: Thought content normal.        Judgment: Judgment normal.     UC Treatments / Results  Labs (all labs ordered are listed, but only abnormal results are displayed) Labs Reviewed  POCT URINALYSIS DIP (MANUAL ENTRY) - Abnormal; Notable for the following components:      Result Value   Spec Grav, UA >=1.030 (*)    Leukocytes, UA Trace (*)    All other components within normal limits  URINE CULTURE  POCT URINE PREGNANCY  CERVICOVAGINAL ANCILLARY ONLY    EKG   Radiology No results found.  Procedures Procedures (including critical care time)  Medications Ordered in UC Medications - No data to display  Initial Impression / Assessment and Plan / UC Course  I have reviewed the triage vital signs and the nursing notes.  Pertinent labs & imaging results that were available during my care of the patient were reviewed by me and considered in my medical decision making (see chart for details).     Urinalysis was not definitive for urinary tract infection.  Urine culture and cervicovaginal swab are pending.  Will await swab for treatment.  No signs of pelvic inflammatory disorder on exam.  Pelvic exam was deferred with shared decision making.Discussed strict return precautions. Patient verbalized understanding and is agreeable with plan.  Final Clinical Impressions(s) / UC Diagnoses   Final diagnoses:  Vaginal discharge  Screening examination for venereal disease     Discharge Instructions      Your lab work is pending.  We will call if it is positive and treat as appropriate.      ED Prescriptions   None    PDMP not reviewed this encounter.   Odis Luster, FNP 09/03/21 1137

## 2021-09-04 LAB — URINE CULTURE

## 2021-09-06 LAB — CERVICOVAGINAL ANCILLARY ONLY
Bacterial Vaginitis (gardnerella): NEGATIVE
Candida Glabrata: NEGATIVE
Candida Vaginitis: NEGATIVE
Chlamydia: NEGATIVE
Comment: NEGATIVE
Comment: NEGATIVE
Comment: NEGATIVE
Comment: NEGATIVE
Comment: NEGATIVE
Comment: NORMAL
Neisseria Gonorrhea: NEGATIVE
Trichomonas: NEGATIVE

## 2021-10-25 ENCOUNTER — Other Ambulatory Visit (HOSPITAL_COMMUNITY)
Admission: RE | Admit: 2021-10-25 | Discharge: 2021-10-25 | Disposition: A | Discharge status: Home / Self Care | Payer: Medicaid Other | Source: Ambulatory Visit | Attending: Obstetrics and Gynecology | Admitting: Obstetrics and Gynecology

## 2021-10-25 ENCOUNTER — Other Ambulatory Visit: Payer: Self-pay

## 2021-10-25 ENCOUNTER — Ambulatory Visit (INDEPENDENT_AMBULATORY_CARE_PROVIDER_SITE_OTHER): Payer: Medicaid Other

## 2021-10-25 DIAGNOSIS — R3915 Urgency of urination: Secondary | ICD-10-CM

## 2021-10-25 DIAGNOSIS — N898 Other specified noninflammatory disorders of vagina: Secondary | ICD-10-CM

## 2021-10-25 DIAGNOSIS — R300 Dysuria: Secondary | ICD-10-CM | POA: Diagnosis not present

## 2021-10-25 LAB — POCT URINALYSIS DIPSTICK
Bilirubin, UA: NEGATIVE
Glucose, UA: NEGATIVE
Ketones, UA: NEGATIVE
Nitrite, UA: NEGATIVE
Protein, UA: POSITIVE — AB
Spec Grav, UA: 1.02 (ref 1.010–1.025)
Urobilinogen, UA: 0.2 E.U./dL
pH, UA: 6.5 (ref 5.0–8.0)

## 2021-10-25 MED ORDER — PHENAZOPYRIDINE HCL 200 MG PO TABS: 200.0000 mg | ORAL_TABLET | Freq: Three times a day (TID) | ORAL | 0 refills | Status: DC | PRN

## 2021-10-25 MED ORDER — NITROFURANTOIN MONOHYD MACRO 100 MG PO CAPS: 100.0000 mg | ORAL_CAPSULE | Freq: Two times a day (BID) | ORAL | 0 refills | Status: DC

## 2021-10-25 NOTE — Progress Notes (Signed)
Patient was assessed and managed by nursing staff during this encounter. I have reviewed the chart and agree with the documentation and plan. I have also made any necessary editorial changes.  Catalina Antigua, MD 10/25/2021 11:39 AM

## 2021-10-25 NOTE — Progress Notes (Signed)
SUBJECTIVE: Adriana [name] is a 25 y.o. female who complains of urinary frequency, urgency, hematuria, and dysuria x 6 days, without flank pain, fever, chills. She also complains of having some vaginal discharge with an odor and itching.  OBJECTIVE: Appears well, in no apparent distress.  Vital signs are normal. Urine dipstick shows positive for WBC's, positive for RBC's, and positive for protein.    ASSESSMENT: Dysuria and vaginal discharge.  PLAN: Treatment per orders.  Urine culture sent. Call or return to clinic prn if these symptoms worsen or fail to improve as anticipated.

## 2021-10-26 LAB — CERVICOVAGINAL ANCILLARY ONLY
Bacterial Vaginitis (gardnerella): NEGATIVE
Candida Glabrata: NEGATIVE
Candida Vaginitis: POSITIVE — AB
Chlamydia: NEGATIVE
Comment: NEGATIVE
Comment: NEGATIVE
Comment: NEGATIVE
Comment: NEGATIVE
Comment: NEGATIVE
Comment: NORMAL
Neisseria Gonorrhea: NEGATIVE
Trichomonas: NEGATIVE

## 2021-10-26 MED ORDER — FLUCONAZOLE 150 MG PO TABS: 150.0000 mg | ORAL_TABLET | Freq: Once | ORAL | 0 refills | Status: AC

## 2021-10-26 NOTE — Addendum Note (Signed)
Addended by: Catalina Antigua on: 10/26/2021 02:07 PM   Modules accepted: Orders

## 2021-10-29 LAB — URINE CULTURE

## 2021-11-28 NOTE — L&D Delivery Note (Signed)
[facility] Delivery Note  Adriana [name] is a 26 y.o. [id number] s/p SVD at [redacted]w[redacted]d. She was admitted for IOL for hx IUFD.   ROM: 3h 58m with clear fluid GBS Status:  Negative/-- (09/25 0937) Maximum Maternal Temperature:  Temp (24hrs), Avg:98 F (36.7 C), Min:97.9 F (36.6 C), Max:98.1 F (36.7 C)    Labor Progress: Patient arrived at 5 cm dilation and was induced with Pitocin and AROM .   Delivery Date/Time: 09/07/2022 at 0138 Delivery: Called to room and patient was complete and pushing. Head delivered in ROA position. No nuchal cord present. Shoulder and body delivered in usual fashion. Infant with spontaneous cry, placed on mother's abdomen, dried and stimulated. Cord clamped x 2 after 1-minute delay, and cut by [name]. Cord blood drawn. Placenta delivered spontaneously with gentle cord traction. Fundus firm with massage and Pitocin. Labia, perineum, vagina, and cervix inspected with no lacerations.   Placenta: Spontaneous, intact, 3 vessel cord Complications: None Lacerations: None EBL: 227 Analgesia: None   Infant: APGAR (1 MIN): 8   APGAR (5 MINS): 9   APGAR (10 MINS):    Weight: Pending  Gifford Shave, MD  09/07/2022 2:18 AM

## 2022-02-02 ENCOUNTER — Other Ambulatory Visit: Payer: Self-pay

## 2022-02-02 ENCOUNTER — Ambulatory Visit (INDEPENDENT_AMBULATORY_CARE_PROVIDER_SITE_OTHER): Payer: Medicaid Other | Admitting: *Deleted

## 2022-02-02 ENCOUNTER — Other Ambulatory Visit (HOSPITAL_COMMUNITY)
Admission: RE | Admit: 2022-02-02 | Discharge: 2022-02-02 | Disposition: A | Discharge status: Home / Self Care | Payer: Medicaid Other | Source: Ambulatory Visit | Attending: Obstetrics and Gynecology | Admitting: Obstetrics and Gynecology

## 2022-02-02 VITALS — BP 138/85 | HR 76

## 2022-02-02 DIAGNOSIS — Z3201 Encounter for pregnancy test, result positive: Secondary | ICD-10-CM

## 2022-02-02 DIAGNOSIS — Z113 Encounter for screening for infections with a predominantly sexual mode of transmission: Secondary | ICD-10-CM | POA: Diagnosis not present

## 2022-02-02 LAB — POCT URINE PREGNANCY: Preg Test, Ur: POSITIVE — AB

## 2022-02-02 MED ORDER — PROMETHAZINE HCL 25 MG PO TABS: 25.0000 mg | ORAL_TABLET | Freq: Four times a day (QID) | ORAL | 1 refills | Status: DC | PRN

## 2022-02-02 MED ORDER — TERCONAZOLE 0.8 % VA CREA: 1.0000 | TOPICAL_CREAM | Freq: Every day | VAGINAL | 0 refills | Status: DC

## 2022-02-02 NOTE — Progress Notes (Signed)
Adriana [name] presents today for UPT. She has no unusual complaints. ?LMP: 12/14/21 ?   ?OBJECTIVE: Appears well, in no apparent distress.  ?OB History   ? ? Gravida  ?3  ? Para  ?3  ? Term  ?1  ? Preterm  ?2  ? AB  ?   ? Living  ?2  ?  ? ? SAB  ?   ? IAB  ?   ? Ectopic  ?   ? Multiple  ?0  ? Live Births  ?2  ?   ?  ?  ? ?Home UPT Result: positive ?In-Office UPT result: positive ?I have reviewed the patient's medical, obstetrical, social, and family histories, and medications.  ? ?ASSESSMENT: Positive pregnancy test ? ?PLAN ?Prenatal care to be completed at: [facility]. Patient will get OTC PNV. ? ?SUBJECTIVE:  ?26 y.o. female who desires a STI screen. ?Reports thick white vaginal discharge. Denies bleeding or significant pelvic pain. No UTI symptoms. Denies history of known exposure to STD. ? ?LMP 12/14/21 ? ?OBJECTIVE:  ?She appears well. ? ? ?ASSESSMENT:  ?STI Screen  ? ?PLAN:  ?Pt offered STI blood screening-declined ?GC, chlamydia, and trichomonas probe sent to lab.  ?Treatment: To be determined once lab results are received. Terazol RX for symptomatic yeast infection. ? ?Pt follow up as needed.  ?  ?

## 2022-02-03 LAB — CERVICOVAGINAL ANCILLARY ONLY
Bacterial Vaginitis (gardnerella): NEGATIVE
Candida Glabrata: NEGATIVE
Candida Vaginitis: POSITIVE — AB
Chlamydia: NEGATIVE
Comment: NEGATIVE
Comment: NEGATIVE
Comment: NEGATIVE
Comment: NEGATIVE
Comment: NEGATIVE
Comment: NORMAL
Neisseria Gonorrhea: NEGATIVE
Trichomonas: NEGATIVE

## 2022-02-09 ENCOUNTER — Ambulatory Visit: Payer: Medicaid Other

## 2022-02-09 ENCOUNTER — Other Ambulatory Visit: Payer: Self-pay

## 2022-02-09 ENCOUNTER — Ambulatory Visit (INDEPENDENT_AMBULATORY_CARE_PROVIDER_SITE_OTHER): Payer: Medicaid Other

## 2022-02-09 VITALS — BP 125/73 | HR 101 | Ht 65.0 in | Wt 200.4 lb

## 2022-02-09 DIAGNOSIS — O3680X0 Pregnancy with inconclusive fetal viability, not applicable or unspecified: Secondary | ICD-10-CM | POA: Diagnosis not present

## 2022-02-09 DIAGNOSIS — Z3481 Encounter for supervision of other normal pregnancy, first trimester: Secondary | ICD-10-CM

## 2022-02-09 DIAGNOSIS — Z3490 Encounter for supervision of normal pregnancy, unspecified, unspecified trimester: Secondary | ICD-10-CM | POA: Insufficient documentation

## 2022-02-09 DIAGNOSIS — O0990 Supervision of high risk pregnancy, unspecified, unspecified trimester: Secondary | ICD-10-CM | POA: Insufficient documentation

## 2022-02-09 MED ORDER — GOJJI WEIGHT SCALE MISC: 1.0000 | 0 refills | Status: DC

## 2022-02-09 MED ORDER — BLOOD PRESSURE KIT DEVI: 1.0000 | 0 refills | Status: DC

## 2022-02-09 MED ORDER — VITAFOL GUMMIES 3.33-0.333-34.8 MG PO CHEW: 3.0000 | CHEWABLE_TABLET | Freq: Every day | ORAL | 11 refills | Status: DC

## 2022-02-09 NOTE — Progress Notes (Signed)
New OB Intake ? ?I connected with  Arty Baumgartner on 02/09/22 at  8:15 AM EDT by other and verified that I am speaking with the correct person using two identifiers. Nurse is located at [facility] and pt is located at [place]. ? ?I discussed the limitations, risks, security and privacy concerns of performing an evaluation and management service by telephone and the availability of in person appointments. I also discussed with the patient that there may be a patient responsible charge related to this service. The patient expressed understanding and agreed to proceed. ? ?I explained I am completing New OB Intake today. We discussed her EDD of 09/20/22 that is based on LMP of 12/14/21. Pt is G4/P1202. I reviewed her allergies, medications, Medical/Surgical/OB history, and appropriate screenings. I informed her of [facility] services. Based on history, this is a/an  pregnancy complicated by preterm labor  and Stillbirth  .  ? ?Patient Active Problem List  ? Diagnosis Date Noted  ? Migraine headache with aura 04/16/2014  ? ? ?Concerns addressed today ? ?Delivery Plans:  ?[facility].  ? ?MyChart/Babyscripts ?MyChart access verified. I explained pt will have some visits in office and some virtually. Babyscripts instructions given and order placed. Patient verifies receipt of registration text/e-mail. Account successfully created and app downloaded. ? ?Blood Pressure Cuff  ?Blood pressure cuff ordered for patient to pick-up from [employer]. Explained after first prenatal appt pt will check weekly and document in 20. ? ?Weight scale: Patient does not  have weight scale. Weight scale ordered for patient to pick up from [employer].  ? ?Anatomy US ?Explained first scheduled [place] will be around 19 weeks. Dating and viability ?Scheduled AFP lab only appointment if [facility] pt for same day as anatomy US.  ? ?Labs ?Discussed Johnsie Cancel genetic screening with patient. Would like both Panorama and  Horizon drawn at new OB visit.Also if interested in genetic testing, tell patient she will need AFP 15-21 weeks to complete genetic testing .Routine prenatal labs needed. ? ?Covid Vaccine ?Patient has not covid vaccine.  ?  ?Is patient interested in [place]? Yes  "Interested in [facility] - Schedule next visit with CNM" on sticky note ? ?Informed patient of [facility] website  and placed link in her AVS.  ? ?Social Determinants of Health ?Food Insecurity: Patient denies food insecurity. ?WIC Referral: Patient is interested in referral to [facility].  ?Transportation: Patient denies transportation needs. ?Childcare: Discussed no children allowed at ultrasound appointments. Offered childcare services; patient declines childcare services at this time. ? ?Send link to Pregnancy Navigators ? ? ?Placed OB Box on problem list and updated ? ?First visit review ?I reviewed new OB appt with pt. I explained she will have a pelvic exam, ob bloodwork with genetic screening, and PAP smear. Explained pt will be seen by Lynnda Shields at first visit; encounter routed to appropriate provider. Explained that patient will be seen by pregnancy navigator following visit with provider. [facility] information placed in AVS.  ? ?Lucianne Lei, RN ?02/09/2022  8:22 AM  ?

## 2022-02-12 ENCOUNTER — Encounter (HOSPITAL_COMMUNITY): Payer: Self-pay | Admitting: Family Medicine

## 2022-02-12 ENCOUNTER — Inpatient Hospital Stay (HOSPITAL_COMMUNITY)
Admission: AD | Admit: 2022-02-12 | Discharge: 2022-02-13 | Disposition: A | Discharge status: Home / Self Care | Payer: Medicaid Other | Attending: Family Medicine | Admitting: Family Medicine

## 2022-02-12 DIAGNOSIS — O26891 Other specified pregnancy related conditions, first trimester: Secondary | ICD-10-CM | POA: Insufficient documentation

## 2022-02-12 DIAGNOSIS — Z3A08 8 weeks gestation of pregnancy: Secondary | ICD-10-CM | POA: Insufficient documentation

## 2022-02-12 DIAGNOSIS — O09211 Supervision of pregnancy with history of pre-term labor, first trimester: Secondary | ICD-10-CM | POA: Insufficient documentation

## 2022-02-12 DIAGNOSIS — O09291 Supervision of pregnancy with other poor reproductive or obstetric history, first trimester: Secondary | ICD-10-CM | POA: Insufficient documentation

## 2022-02-12 DIAGNOSIS — G43019 Migraine without aura, intractable, without status migrainosus: Secondary | ICD-10-CM | POA: Diagnosis not present

## 2022-02-12 DIAGNOSIS — R110 Nausea: Secondary | ICD-10-CM | POA: Diagnosis not present

## 2022-02-12 DIAGNOSIS — O99351 Diseases of the nervous system complicating pregnancy, first trimester: Secondary | ICD-10-CM | POA: Insufficient documentation

## 2022-02-12 MED ORDER — ACETAMINOPHEN 500 MG PO TABS
1000.0000 mg | ORAL_TABLET | Freq: Once | ORAL | Status: AC
  Administered 2022-02-12: 1000 mg via ORAL
  Filled 2022-02-12: qty 2

## 2022-02-12 MED ORDER — METOCLOPRAMIDE HCL 10 MG PO TABS
10.0000 mg | ORAL_TABLET | Freq: Once | ORAL | Status: AC
  Administered 2022-02-12: 10 mg via ORAL
  Filled 2022-02-12: qty 1

## 2022-02-12 MED ORDER — CYCLOBENZAPRINE HCL 5 MG PO TABS
10.0000 mg | ORAL_TABLET | Freq: Once | ORAL | Status: AC
  Administered 2022-02-12: 10 mg via ORAL
  Filled 2022-02-12: qty 2

## 2022-02-12 MED ORDER — METOCLOPRAMIDE HCL 5 MG/ML IJ SOLN
10.0000 mg | Freq: Once | INTRAMUSCULAR | Status: AC
  Administered 2022-02-12: 10 mg via INTRAVENOUS
  Filled 2022-02-12: qty 2

## 2022-02-12 MED ORDER — DIPHENHYDRAMINE HCL 50 MG/ML IJ SOLN
25.0000 mg | Freq: Once | INTRAMUSCULAR | Status: AC
  Administered 2022-02-12: 25 mg via INTRAVENOUS
  Filled 2022-02-12: qty 1

## 2022-02-12 MED ORDER — SODIUM CHLORIDE 0.9 % IV SOLN
500.0000 mg | Freq: Once | INTRAVENOUS | Status: AC
  Administered 2022-02-12: 500 mg via INTRAVENOUS
  Filled 2022-02-12: qty 2

## 2022-02-12 MED ORDER — LACTATED RINGERS IV BOLUS
1000.0000 mL | Freq: Once | INTRAVENOUS | Status: AC
  Administered 2022-02-12: 1000 mL via INTRAVENOUS

## 2022-02-12 NOTE — MAU Provider Note (Signed)
?History  ?  ? ?[id number] ? ?Arrival date and time: 02/12/22 1945 ?  ? ?Chief Complaint  ?Patient presents with  ? Headache  ? ? ? ?HPI ?Adriana [name] is a 26 y.o. at 59w4dby LMP with PMHx notable for migraines, IUFD, & preterm delivery x 2, who presents for headache.  ?Symptoms started last night. Reports left temporal headache that radiates to her neck. Worse with lights & sounds. Rates 10/10. Took 2 extra strength tylenol this morning without relief. Consistent with previous migraines. Endorses some nausea. Denies fever, vomiting, abdominal pain, or vaginal bleeding.  ? ?OB History   ? ? Gravida  ?4  ? Para  ?3  ? Term  ?1  ? Preterm  ?2  ? AB  ?   ? Living  ?2  ?  ? ? SAB  ?   ? IAB  ?   ? Ectopic  ?   ? Multiple  ?0  ? Live Births  ?2  ?   ?  ?  ? ? ?Past Medical History:  ?Diagnosis Date  ? History of IUFD 01/16/2020  ? History of preterm delivery 08/14/2019  ? First pregnancy PPROM with delivery at 319w5dffered Makena--> patient declined  ? 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 Tested on admission for IUFD 02/11/19.  No hx of oral or genital outbreaks. Recommend prophylaxis in any future pregnancies.  ? Migraines   ? ? ?Past Surgical History:  ?Procedure Laterality Date  ? HERNIA REPAIR    ? as an infant  ? ? ?Family History  ?Problem Relation Age of Onset  ? Healthy Mother   ? Healthy Father   ? ? ?Allergies  ?Allergen Reactions  ? Imitrex [Sumatriptan] Other (See Comments)  ?  Chest pain, heaviness, migraine  ? Latex Rash  ? ? ?No current facility-administered medications on file prior to encounter.  ? ?Current Outpatient Medications on File Prior to Encounter  ?Medication Sig Dispense Refill  ? Prenatal Vit-Fe Phos-FA-Omega (VITAFOL GUMMIES) 3.33-0.333-34.8 MG CHEW Chew 3 tablets by mouth daily. 90 tablet 11  ? Blood Pressure Monitoring (BLOOD PRESSURE KIT) DEVI 1 kit by Does not apply route once a week. Check BP Weekly.  Large Cuff.  Dx:Z13.6  Z34.86  O09.0 (Patient not taking: Reported on 02/09/2022) 1 kit 0  ?  Blood Pressure Monitoring (BLOOD PRESSURE KIT) DEVI 1 kit by Does not apply route once a week. 1 each 0  ? Misc. Devices (GOJJI WEIGHT SCALE) MISC 1 Device by Does not apply route every 30 (thirty) days. 1 each 0  ? promethazine (PHENERGAN) 25 MG tablet Take 1 tablet (25 mg total) by mouth every 6 (six) hours as needed for nausea or vomiting. (Patient not taking: Reported on 02/09/2022) 30 tablet 1  ? ? ? ?ROS ?Pertinent positives and negative per HPI, all others reviewed and negative ? ?Physical Exam  ? ?BP 123/74 (BP Location: Right Arm)   Pulse 76   Temp 98 ?F (36.7 ?C) (Oral)   Resp 17   Ht '5\' 5"'  (1.651 m)   Wt 92.5 kg   LMP 12/14/2021 (Exact Date)   SpO2 100%   BMI 33.95 kg/m?  ? ?Patient Vitals for the past 24 hrs: ? BP Temp Temp src Pulse Resp SpO2 Height Weight  ?02/12/22 1958 123/74 98 ?F (36.7 ?C) Oral 76 17 100 % '5\' 5"'  (1.651 m) 92.5 kg  ? ? ?Physical Exam ?Vitals and nursing note reviewed.  ?Constitutional:   ?   General:  She is not in acute distress. ?   Appearance: She is well-developed. She is not ill-appearing.  ?Eyes:  ?   General: No scleral icterus. ?   Extraocular Movements:  ?   Right eye: No nystagmus.  ?   Left eye: No nystagmus.  ?   Pupils: Pupils are equal, round, and reactive to light.  ?Pulmonary:  ?   Effort: Pulmonary effort is normal. No respiratory distress.  ?Neurological:  ?   Mental Status: She is alert.  ?Psychiatric:     ?   Mood and Affect: Mood normal.     ?   Behavior: Behavior normal.  ?  ?\ ?Labs ?No results found for this or any previous visit (from the past 24 hour(s)). ? ?Imaging ?No results found. ? ?MAU Course  ?Procedures ?Lab Orders  ?No laboratory test(s) ordered today  ? ?Meds ordered this encounter  ?Medications  ? acetaminophen (TYLENOL) tablet 1,000 mg  ? cyclobenzaprine (FLEXERIL) tablet 10 mg  ? metoCLOPramide (REGLAN) tablet 10 mg  ? lactated ringers bolus 1,000 mL  ? metoCLOPramide (REGLAN) injection 10 mg  ? diphenhydrAMINE (BENADRYL) injection 25 mg   ? caffeine-sodium benzoate ADULT 500 mg in sodium chloride 0.9 % 1,000 mL IVPB  ? acetaminophen (TYLENOL) tablet 650 mg  ? cyclobenzaprine (FLEXERIL) tablet 10 mg  ? metoCLOPramide (REGLAN) 10 MG tablet  ?  Sig: Take 1 tablet (10 mg total) by mouth every 8 (eight) hours as needed for nausea (or headache).  ?  Dispense:  30 tablet  ?  Refill:  0  ?  Order Specific Question:   Supervising Provider  ?  Answer:   Tania Ade H [[id number]]  ? cyclobenzaprine (FLEXERIL) 10 MG tablet  ?  Sig: Take 1 tablet (10 mg total) by mouth 3 (three) times daily as needed (headache).  ?  Dispense:  30 tablet  ?  Refill:  0  ?  Order Specific Question:   Supervising Provider  ?  Answer:   Tania Ade H [[id number]]  ? ?Imaging Orders  ?No imaging studies ordered today  ? ? ?MDM ?Patient presents with migraine headache - consistent with history of headaches. Given tylenol, reglan, & flexeril - patient immediately vomited. Was given IV fluids, reglan, & benadryl. Nausea resolved & headache somewhat improved. Proceeded to IV caffeine. Patient reports improvement in headache & requesting to be discharged home so she can "sleep off her headache". Since it was 4+ hours after previous dosing which she likely vomited up - she was given tylenol & flexeril prior to discharge.  ?Assessment and Plan  ? ?1. Intractable migraine without aura and without status migrainosus   ?2. [redacted] weeks gestation of pregnancy   ? ?-Rx reglan & flexeril prn to take with tylenol ?-reviewed reasons to return to MAU ? ?Jorje Guild, NP ?02/13/22 ?1:24 AM ? ? ?

## 2022-02-12 NOTE — MAU Note (Signed)
Adriana [name] is a 26 y.o. at [redacted]w[redacted]d here in MAU reporting: migraine that began last night. Pt reports she push fluids and took tylenol 1000. Without decreasing the pain. Also states she sees speckels that come and go.  ? ?Pain score: 10 ?Vitals:  ? 02/12/22 1958  ?BP: 123/74  ?Pulse: 76  ?Resp: 17  ?Temp: 98 ?F (36.7 ?C)  ?SpO2: 100%  ?   ? ? ? ? ? ?

## 2022-02-13 DIAGNOSIS — G43019 Migraine without aura, intractable, without status migrainosus: Secondary | ICD-10-CM | POA: Diagnosis not present

## 2022-02-13 DIAGNOSIS — Z3A08 8 weeks gestation of pregnancy: Secondary | ICD-10-CM | POA: Diagnosis not present

## 2022-02-13 MED ORDER — METOCLOPRAMIDE HCL 10 MG PO TABS
10.0000 mg | ORAL_TABLET | Freq: Three times a day (TID) | ORAL | 0 refills | Status: DC | PRN
Start: 2022-02-13 — End: 2022-07-05

## 2022-02-13 MED ORDER — CYCLOBENZAPRINE HCL 5 MG PO TABS
10.0000 mg | ORAL_TABLET | Freq: Once | ORAL | Status: AC
  Administered 2022-02-13: 10 mg via ORAL
  Filled 2022-02-13: qty 2

## 2022-02-13 MED ORDER — ACETAMINOPHEN 325 MG PO TABS
650.0000 mg | ORAL_TABLET | Freq: Once | ORAL | Status: AC
  Administered 2022-02-13: 650 mg via ORAL
  Filled 2022-02-13: qty 2

## 2022-02-13 MED ORDER — CYCLOBENZAPRINE HCL 10 MG PO TABS: 10.0000 mg | ORAL_TABLET | Freq: Three times a day (TID) | ORAL | 0 refills | Status: DC | PRN

## 2022-02-13 NOTE — MAU Note (Signed)
Pt reports nausea has resolved but headache remains at 6 -on 0-10 scale. ?

## 2022-02-13 NOTE — Discharge Instructions (Signed)
For prevention of migraines in pregnancy: -Magnesium, 400mg by mouth, once daily -Vitamin B2, 400mg by mouth, once daily  For treatment of migraines in pregnancy: -take medication at the first sign of the pain of a headache, or the first sign of your aura -start with 1000mg Tylenol (do not exceed 4000mg of Tylenol in 24hrs), with or without Reglan 10mg -if no relief after 1-2hours, can take Flexeril 10mg     

## 2022-03-10 ENCOUNTER — Ambulatory Visit (INDEPENDENT_AMBULATORY_CARE_PROVIDER_SITE_OTHER): Payer: Medicaid Other | Admitting: Obstetrics and Gynecology

## 2022-03-10 ENCOUNTER — Other Ambulatory Visit (HOSPITAL_COMMUNITY)
Admission: RE | Admit: 2022-03-10 | Discharge: 2022-03-10 | Disposition: A | Discharge status: Home / Self Care | Payer: Medicaid Other | Source: Ambulatory Visit | Attending: Obstetrics and Gynecology | Admitting: Obstetrics and Gynecology

## 2022-03-10 VITALS — BP 135/79 | HR 93 | Wt 204.1 lb

## 2022-03-10 DIAGNOSIS — Z3A12 12 weeks gestation of pregnancy: Secondary | ICD-10-CM

## 2022-03-10 DIAGNOSIS — Z3481 Encounter for supervision of other normal pregnancy, first trimester: Secondary | ICD-10-CM | POA: Insufficient documentation

## 2022-03-10 DIAGNOSIS — K5900 Constipation, unspecified: Secondary | ICD-10-CM

## 2022-03-10 DIAGNOSIS — Z8759 Personal history of other complications of pregnancy, childbirth and the puerperium: Secondary | ICD-10-CM

## 2022-03-10 DIAGNOSIS — O99611 Diseases of the digestive system complicating pregnancy, first trimester: Secondary | ICD-10-CM

## 2022-03-10 MED ORDER — DOCUSATE SODIUM 100 MG PO CAPS: 100.0000 mg | ORAL_CAPSULE | Freq: Two times a day (BID) | ORAL | 2 refills | Status: DC | PRN

## 2022-03-10 NOTE — Progress Notes (Signed)
? ?INITIAL PRENATAL VISIT NOTE ? ?Subjective:  ?Adriana [name] is a 26 y.o. [id number] at 14w2dby sure LMP c/w early u/s being seen today for her initial prenatal visit. She has an obstetric history significant for IUFD at 381weeks and hx of preterm delivery. She has a medical history significant for migraines and HSV 2. ? ?Patient reports no complaints.  Denies leaking of fluid.  ? ? ?Past Medical History:  ?Diagnosis Date  ? History of IUFD 01/16/2020  ? History of preterm delivery 08/14/2019  ? First pregnancy PPROM with delivery at 349w5dffered Makena--> patient declined  ? 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 Tested on admission for IUFD 02/11/19.  No hx of oral or genital outbreaks. Recommend prophylaxis in any future pregnancies.  ? Migraines   ? ? ?Past Surgical History:  ?Procedure Laterality Date  ? HERNIA REPAIR    ? as an infant  ? ? ?OB History  ?Gravida Para Term Preterm AB Living  ?'4 3 1 2   2  ' ?SAB IAB Ectopic Multiple Live Births  ?      0 2  ?  ?# Outcome Date GA Lbr Len/2nd Weight Sex Delivery Anes PTL Lv  ?4 Current           ?3 Preterm 01/16/20 361w6d:25 / 00:06 6 lb 8.9 oz (2.975 kg) M Vag-Spont EPI  LIV  ?2 Term 02/11/19 39w16w1d40 / 00:18 6 lb 9.8 oz (3 kg) F Vag-Spont EPI  FD  ?1 Preterm 02/09/17 36w528w5d9 / 01:44 6 lb 6.8 oz (2.915 kg) F Vag-Spont EPI  LIV  ?   Birth Comments: WNL  ? ? ?Social History  ? ?Socioeconomic History  ? Marital status: Single  ?  Spouse name: Not on file  ? Number of children: 1  ? Years of education: Not on file  ? Highest education level: Not on file  ?Occupational History  ? Occupation: Unemployed  ?  Comment: non employeed  ?Tobacco Use  ? Smoking status: Former  ?  Types: Cigarettes  ?  Quit date: 06/30/2016  ?  Years since quitting: 5.6  ? Smokeless tobacco: Never  ?Vaping Use  ? Vaping Use: Never used  ?Substance and Sexual Activity  ? Alcohol use: No  ?  Alcohol/week: 0.0 standard drinks  ? Drug use: No  ? Sexual activity: Yes  ?  Partners: Male  ?  Birth  control/protection: None  ?Other Topics Concern  ? Not on file  ?Social History Narrative  ? Not on file  ? ?Social Determinants of Health  ? ?Financial Resource Strain: Not on file  ?Food Insecurity: Not on file  ?Transportation Needs: Not on file  ?Physical Activity: Not on file  ?Stress: Not on file  ?Social Connections: Not on file  ? ? ?Family History  ?Problem Relation Age of Onset  ? Healthy Mother   ? Healthy Father   ? ? ? ?Current Outpatient Medications:  ?  Blood Pressure Monitoring (BLOOD PRESSURE KIT) DEVI, 1 kit by Does not apply route once a week., Disp: 1 each, Rfl: 0 ?  docusate sodium (COLACE) 100 MG capsule, Take 1 capsule (100 mg total) by mouth 2 (two) times daily as needed., Disp: 30 capsule, Rfl: 2 ?  Misc. Devices (GOJJI WEIGHT SCALE) MISC, 1 Device by Does not apply route every 30 (thirty) days., Disp: 1 each, Rfl: 0 ?  Prenatal Vit-Fe Phos-FA-Omega (VITAFOL GUMMIES) 3.33-0.333-34.8 MG CHEW, Chew 3 tablets by mouth daily., Disp:  90 tablet, Rfl: 11 ?  cyclobenzaprine (FLEXERIL) 10 MG tablet, Take 1 tablet (10 mg total) by mouth 3 (three) times daily as needed (headache). (Patient not taking: Reported on 03/10/2022), Disp: 30 tablet, Rfl: 0 ?  metoCLOPramide (REGLAN) 10 MG tablet, Take 1 tablet (10 mg total) by mouth every 8 (eight) hours as needed for nausea (or headache). (Patient not taking: Reported on 03/10/2022), Disp: 30 tablet, Rfl: 0 ?  promethazine (PHENERGAN) 25 MG tablet, Take 1 tablet (25 mg total) by mouth every 6 (six) hours as needed for nausea or vomiting. (Patient not taking: Reported on 02/09/2022), Disp: 30 tablet, Rfl: 1 ? ?Allergies  ?Allergen Reactions  ? Imitrex [Sumatriptan] Other (See Comments)  ?  Chest pain, heaviness, migraine  ? Latex Rash  ? ? ?Review of Systems: Negative except for what is mentioned in HPI. ? ?Objective:  ? ?Vitals:  ? 03/10/22 1031  ?BP: 135/79  ?Pulse: 93  ?Weight: 204 lb 1.6 oz (92.6 kg)  ? ? ?Fetal Status: Fetal Heart Rate (bpm): 160         ? ?Physical Exam: ?BP 135/79   Pulse 93   Wt 204 lb 1.6 oz (92.6 kg)   LMP 12/14/2021 (Exact Date)   BMI 33.96 kg/m?  ?CONSTITUTIONAL: Well-developed, well-nourished female in no acute distress.  ?NEUROLOGIC: Alert and oriented to person, place, and time. Normal reflexes, muscle tone coordination. No cranial nerve deficit noted. ?PSYCHIATRIC: Normal mood and affect. Normal behavior. Normal judgment and thought content. ?SKIN: Skin is warm and dry. No rash noted. Not diaphoretic. No erythema. No pallor. ?HENT:  Normocephalic, atraumatic, External right and left ear normal. Oropharynx is clear and moist ?EYES: Conjunctivae and EOM are normal. Pupils are equal, round, and reactive to light. No scleral icterus.  ?NECK: Normal range of motion, supple, no masses ?CARDIOVASCULAR: Normal heart rate noted, regular rhythm ?RESPIRATORY: Effort and breath sounds normal, no problems with respiration noted ?BREASTS:deferred ?ABDOMEN: Soft, nontender, nondistended, gravid. ?GU: normal appearing external female genitalia, multiparous normal appearing cervix, scant white discharge in vagina, no lesions noted, pap taken, chaperone present ?Bimanual: 12 weeks sized uterus, no adnexal tenderness or palpable lesions noted ?MUSCULOSKELETAL: Normal range of motion. ?EXT:  No edema and no tenderness. 2+ distal pulses. ? ? ?Assessment and Plan:  ?Pregnancy: [id number] at 71w2dby LMP ? ?1. Encounter for supervision of other normal pregnancy in first trimester ?Continue routine care ?Pt desires waterbirth if possible, information given ? ?- CBC/D/Plt+RPR+Rh+ABO+RubIgG... ?- Cervicovaginal ancillary only( [place]) ?- Genetic Screening ?- Culture, OB Urine ?- Cytology - PAP( [place]) ?- [place]FM OB DETAIL +14 WK; Future ? ?2. [redacted] weeks gestation of pregnancy ? ? ?3. History of IUFD ?Management per MFM, may need BPP starting at 36 weeks or earlier ?- [place]FM OB DETAIL +14 WK; Future ? ?4. Constipation during pregnancy in first  trimester ? ?- docusate sodium (COLACE) 100 MG capsule; Take 1 capsule (100 mg total) by mouth 2 (two) times daily as needed.  Dispense: 30 capsule; Refill: 2 ? ? ?Preterm labor symptoms and general obstetric precautions including but not limited to vaginal bleeding, contractions, leaking of fluid and fetal movement were reviewed in detail with the patient. ? ?Please refer to After Visit Summary for other counseling recommendations.  ? ?Return in about 4 weeks (around 04/07/2022) for ROB, in person. ? ?LGriffin Basil?03/10/2022 ?1:00 PM  ?

## 2022-03-10 NOTE — Patient Instructions (Signed)
?  Considering Waterbirth? ?Guide for patients at [facility]) ?Why consider waterbirth? ?Gentle birth for babies  ?Less pain medicine used in labor  ?May allow for passive descent/less pushing  ?May reduce perineal tears  ?More mobility and instinctive maternal position changes  ?Increased maternal relaxation  ? ?Is waterbirth safe? What are the risks of infection, drowning or other complications? ?Infection:  ?Very low risk (3.7 % for tub vs 4.8% for bed)  ?7 in 8000 waterbirths with documented infection  ?Poorly cleaned equipment most common cause  ?Slightly lower group B strep transmission rate  ?Drowning  ?Maternal:  ?Very low risk  ?Related to seizures or fainting  ?Newborn:  ?Very low risk. No evidence of increased risk of respiratory problems in multiple large studies  ?Physiological protection from breathing under water  ?Avoid underwater birth if there are any fetal complications  ?Once baby's head is out of the water, keep it out.  ?Birth complication  ?Some reports of cord trauma, but risk decreased by bringing baby to surface gradually  ?No evidence of increased risk of shoulder dystocia. Mothers can usually change positions faster in water than in a bed, possibly aiding the maneuvers to free the shoulder.  ? ?There are 2 things you MUST do to have a waterbirth with [facility]: ?Attend a waterbirth class at [facility]   ?3rd Wednesday of every month from 7-9 pm (virtual during COVID) ?Free ?Register online at [email or web address] or [email or web address] or by calling [phone] ?Bring [place] the certificate from the class to your prenatal appointment or send via MyChart ?Meet with a midwife at 36 weeks* to see if you can still plan a waterbirth and to sign the consent.  ? ?*We also recommend that you schedule as many of your prenatal visits with a midwife as possible.   ? ?Helpful information: ?You may want to bring a bathing suit top to the hospital  to wear during labor but this is optional.  All other supplies are provided by the hospital. ?Please arrive at the hospital with signs of active labor, and do not wait at home until late in labor. It takes 45 min- 1 hour for fetal monitoring, and check in to your room to take place, plus transport and filling of the waterbirth tub.   ? ?Things that would prevent you from having a waterbirth: ?Premature, <37wks  ?Previous cesarean birth  ?Presence of thick meconium-stained fluid  ?Multiple gestation (Twins, triplets, etc.)  ?Uncontrolled diabetes or gestational diabetes requiring medication  ?Hypertension diagnosed in pregnancy or preexisting hypertension (gestational hypertension, preeclampsia, or chronic hypertension) ?Fetal growth restriction (your baby measures less than 10th percentile on ultrasound) ?Heavy vaginal bleeding  ?Non-reassuring fetal heart rate  ?Active infection (MRSA, etc.). Group B Strep is NOT a contraindication for waterbirth.  ?If your labor has to be induced and induction method requires continuous monitoring of the baby's heart rate  ?Other risks/issues identified by your obstetrical provider  ? ?Please remember that birth is unpredictable. Under certain unforeseeable circumstances your provider may advise against giving birth in the tub. These decisions will be made on a case-by-case basis and with the safety of you and your baby as our highest priority. ? ? ? ?Updated 03/02/22  ? ? ?Interested in [place] [ ] Class [ ] Consent [ ] CNM visit  ?

## 2022-03-10 NOTE — Progress Notes (Signed)
Patient is here for New Ob visit. Patient states that she continues to get yeast infections and would like to be tested.  ?

## 2022-03-10 NOTE — Progress Notes (Deleted)
? ?INITIAL PRENATAL VISIT NOTE ? ?Subjective:  ?Adriana [name] is a 26 y.o. G4P1202 at [redacted]w[redacted]d by ***LMP being seen today for her initial prenatal visit. This is a ***planned pregnancy.  She was using *** for birth control previously. She has an obstetric history significant for ***. She has a medical history significant for ***. ? ?Patient reports {sx:[id number]}.   .  .   . Denies leaking of fluid.  ? ? ?Past Medical History:  ?Diagnosis Date  ? History of IUFD 01/16/2020  ? History of preterm delivery 08/14/2019  ? First pregnancy PPROM with delivery at [redacted]w[redacted]d Offered Makena--> patient declined  ? 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 Tested on admission for IUFD 02/11/19.  No hx of oral or genital outbreaks. Recommend prophylaxis in any future pregnancies.  ? Migraines   ? ? ?Past Surgical History:  ?Procedure Laterality Date  ? HERNIA REPAIR    ? as an infant  ? ? ?OB History  ?Gravida Para Term Preterm AB Living  ?4 3 1 2   2  ?SAB IAB Ectopic Multiple Live Births  ?      0 2  ?  ?# Outcome Date GA Lbr Len/2nd Weight Sex Delivery Anes PTL Lv  ?4 Current           ?3 Preterm 01/16/20 [redacted]w[redacted]d 03:25 / 00:06 6 lb 8.9 oz (2.975 kg) M Vag-Spont EPI  LIV  ?2 Term 02/11/19 [redacted]w[redacted]d 00:40 / 00:18 6 lb 9.8 oz (3 kg) F Vag-Spont EPI  FD  ?1 Preterm 02/09/17 [redacted]w[redacted]d 21:39 / 01:44 6 lb 6.8 oz (2.915 kg) F Vag-Spont EPI  LIV  ?   Birth Comments: WNL  ? ? ?Social History  ? ?Socioeconomic History  ? Marital status: Single  ?  Spouse name: Not on file  ? Number of children: 1  ? Years of education: Not on file  ? Highest education level: Not on file  ?Occupational History  ? Occupation: Unemployed  ?  Comment: non employeed  ?Tobacco Use  ? Smoking status: Former  ?  Types: Cigarettes  ?  Quit date: 06/30/2016  ?  Years since quitting: 5.6  ? Smokeless tobacco: Never  ?Vaping Use  ? Vaping Use: Never used  ?Substance and Sexual Activity  ? Alcohol use: No  ?  Alcohol/week: 0.0 standard drinks  ? Drug use: No  ? Sexual activity: Yes  ?  Partners: Male   ?  Birth control/protection: None  ?Other Topics Concern  ? Not on file  ?Social History Narrative  ? Not on file  ? ?Social Determinants of Health  ? ?Financial Resource Strain: Not on file  ?Food Insecurity: Not on file  ?Transportation Needs: Not on file  ?Physical Activity: Not on file  ?Stress: Not on file  ?Social Connections: Not on file  ? ? ?Family History  ?Problem Relation Age of Onset  ? Healthy Mother   ? Healthy Father   ? ? ? ?Current Outpatient Medications:  ?  Blood Pressure Monitoring (BLOOD PRESSURE KIT) DEVI, 1 kit by Does not apply route once a week., Disp: 1 each, Rfl: 0 ?  Misc. Devices (GOJJI WEIGHT SCALE) MISC, 1 Device by Does not apply route every 30 (thirty) days., Disp: 1 each, Rfl: 0 ?  Prenatal Vit-Fe Phos-FA-Omega (VITAFOL GUMMIES) 3.33-0.333-34.8 MG CHEW, Chew 3 tablets by mouth daily., Disp: 90 tablet, Rfl: 11 ?  cyclobenzaprine (FLEXERIL) 10 MG tablet, Take 1 tablet (10 mg total) by mouth 3 (three) times   daily as needed (headache). (Patient not taking: Reported on 03/10/2022), Disp: 30 tablet, Rfl: 0 ?  metoCLOPramide (REGLAN) 10 MG tablet, Take 1 tablet (10 mg total) by mouth every 8 (eight) hours as needed for nausea (or headache). (Patient not taking: Reported on 03/10/2022), Disp: 30 tablet, Rfl: 0 ?  promethazine (PHENERGAN) 25 MG tablet, Take 1 tablet (25 mg total) by mouth every 6 (six) hours as needed for nausea or vomiting. (Patient not taking: Reported on 02/09/2022), Disp: 30 tablet, Rfl: 1 ? ?Allergies  ?Allergen Reactions  ? Imitrex [Sumatriptan] Other (See Comments)  ?  Chest pain, heaviness, migraine  ? Latex Rash  ? ? ?Review of Systems: Negative except for what is mentioned in HPI. ? ?Objective:  ? ?Vitals:  ? 03/10/22 1031  ?BP: 135/79  ?Pulse: 93  ?Weight: 204 lb 1.6 oz (92.6 kg)  ? ? ?Fetal Status: Fetal Heart Rate (bpm): 160        ? ?Physical Exam: ?BP 135/79   Pulse 93   Wt 204 lb 1.6 oz (92.6 kg)   LMP 12/14/2021 (Exact Date)   BMI 33.96 kg/m?   ?CONSTITUTIONAL: Well-developed, well-nourished female in ***no acute distress.  ?NEUROLOGIC: Alert and oriented to person, place, and time. Normal reflexes, muscle tone coordination. No cranial nerve deficit noted. ?PSYCHIATRIC: Normal mood and affect. Normal behavior. Normal judgment and thought content. ?SKIN: Skin is warm and dry. No rash noted. Not diaphoretic. No erythema. No pallor. ?HENT:  Normocephalic, atraumatic, External right and left ear normal. Oropharynx is clear and moist ?EYES: Conjunctivae and EOM are normal. Pupils are equal, round, and reactive to light. No scleral icterus.  ?NECK: Normal range of motion, supple, no masses ?CARDIOVASCULAR: Normal heart rate noted, regular rhythm ?RESPIRATORY: Effort and breath sounds normal, no problems with respiration noted ?BREASTS: symmetric, ***non-tender, no masses palpable ?ABDOMEN: Soft, ***nontender, ***nondistended, gravid. ?GU: ***normal appearing external female genitalia, ***multiparous***nulliparous ***normal appearing cervix, scant white discharge in vagina, no lesions noted ?Bimanual: *** weeks sized uterus, no adnexal tenderness or palpable lesions noted ?MUSCULOSKELETAL: Normal range of motion. ?EXT:  No edema and no tenderness. 2+ distal pulses. ? ? ?Assessment and Plan:  ?Pregnancy: [id number] at 63w2dby ***LMP ? ?1. Encounter for supervision of other normal pregnancy in first trimester ?*** ?- CBC/D/Plt+RPR+Rh+ABO+RubIgG... ?- Cervicovaginal ancillary only( [place]) ?- Genetic Screening ?- Culture, OB Urine ?- Cytology - PAP( [place]) ?- [place]FM OB DETAIL +14 WK; Future ? ?2. [redacted] weeks gestation of pregnancy ?*** ? ?3. History of IUFD ?*** ?- [place]orea[place]FM OB DETAIL +14 WK; Future ? ? ?{Blank single:19197::"Term","Preterm"} labor symptoms and general obstetric precautions including but not limited to vaginal bleeding, contractions, leaking of fluid and fetal movement were reviewed in detail with the patient. ? ?Please refer to After Visit  Summary for other counseling recommendations.  ? ?Return in about 4 weeks (around 04/07/2022) for ROB, in person. ? ?LGriffin Basil?03/10/2022 ?11:10 AM  ?

## 2022-03-11 LAB — CERVICOVAGINAL ANCILLARY ONLY
Bacterial Vaginitis (gardnerella): NEGATIVE
Candida Glabrata: NEGATIVE
Candida Vaginitis: NEGATIVE
Chlamydia: NEGATIVE
Comment: NEGATIVE
Comment: NEGATIVE
Comment: NEGATIVE
Comment: NEGATIVE
Comment: NEGATIVE
Comment: NORMAL
Neisseria Gonorrhea: NEGATIVE
Trichomonas: NEGATIVE

## 2022-03-11 LAB — CBC/D/PLT+RPR+RH+ABO+RUBIGG...
Antibody Screen: NEGATIVE
Basophils Absolute: 0 10*3/uL (ref 0.0–0.2)
Basos: 0 %
EOS (ABSOLUTE): 0.1 10*3/uL (ref 0.0–0.4)
Eos: 2 %
HCV Ab: NONREACTIVE
HIV Screen 4th Generation wRfx: NONREACTIVE
Hematocrit: 36.6 % (ref 34.0–46.6)
Hemoglobin: 12.5 g/dL (ref 11.1–15.9)
Hepatitis B Surface Ag: NEGATIVE
Immature Grans (Abs): 0 10*3/uL (ref 0.0–0.1)
Immature Granulocytes: 0 %
Lymphocytes Absolute: 1.7 10*3/uL (ref 0.7–3.1)
Lymphs: 40 %
MCH: 32.9 pg (ref 26.6–33.0)
MCHC: 34.2 g/dL (ref 31.5–35.7)
MCV: 96 fL (ref 79–97)
Monocytes Absolute: 0.4 10*3/uL (ref 0.1–0.9)
Monocytes: 8 %
Neutrophils Absolute: 2.1 10*3/uL (ref 1.4–7.0)
Neutrophils: 50 %
Platelets: 272 10*3/uL (ref 150–450)
RBC: 3.8 x10E6/uL (ref 3.77–5.28)
RDW: 12.5 % (ref 11.7–15.4)
RPR Ser Ql: NONREACTIVE
Rh Factor: POSITIVE
Rubella Antibodies, IGG: 4.27 index (ref >0.99)
WBC: 4.3 10*3/uL (ref 3.4–10.8)

## 2022-03-11 LAB — HCV INTERPRETATION

## 2022-03-12 LAB — CULTURE, OB URINE

## 2022-03-12 LAB — URINE CULTURE, OB REFLEX: Organism ID, Bacteria: NO GROWTH

## 2022-03-14 LAB — CYTOLOGY - PAP: Diagnosis: NEGATIVE

## 2022-04-05 NOTE — Progress Notes (Signed)
? ?  PRENATAL VISIT NOTE ? ?Subjective:  ?Adriana [name] is a 26 y.o. [id number] at [redacted]w[redacted]d being seen today for ongoing prenatal care.  She is currently monitored for the following issues for this high-risk pregnancy and has Migraine headache with aura; History of IUFD; Supervision of normal pregnancy; and Constipation in pregnancy on their problem list. ? ?Patient reports no complaints.  Reports that she has some RLP.  Contractions: Not present. Vag. Bleeding: None.  Movement: Present. Denies leaking of fluid.  ? ?The following portions of the patient's history were reviewed and updated as appropriate: allergies, current medications, past family history, past medical history, past social history, past surgical history and problem list.  ? ?Objective:  ? ?Vitals:  ? 04/07/22 0853  ?BP: 132/73  ?Pulse: 93  ?Weight: 210 lb (95.3 kg)  ? ? ?Fetal Status: Fetal Heart Rate (bpm): 154   Movement: Present    ? ?General:  Alert, oriented and cooperative. Patient is in no acute distress.  ?Skin: Skin is warm and dry. No rash noted.   ?Cardiovascular: Normal heart rate noted  ?Respiratory: Normal respiratory effort, no problems with respiration noted  ?Abdomen: Soft, gravid, appropriate for gestational age.  Pain/Pressure: Absent     ?Pelvic: Cervical exam deferred        ?Extremities: Normal range of motion.  Edema: None  ?Mental Status: Normal mood and affect. Normal behavior. Normal judgment and thought content.  ? ?Assessment and Plan:  ?Pregnancy: [id number] at [redacted]w[redacted]d ? ?1. Encounter for supervision of other normal pregnancy in second trimester   ?2. [redacted] weeks gestation of pregnancy   ? ?-patient doing well, will do AFP ?-reviewed her fetal loss; support offered; reviewed she will have extra monitoring later in pregnancy ?-thinking about BTL, also husband is considering vasectomy ? ?Preterm labor symptoms and general obstetric precautions including but not limited to vaginal bleeding, contractions, leaking of fluid and fetal  movement were reviewed in detail with the patient. ?Please refer to After Visit Summary for other counseling recommendations.  ? ?Return in about 4 weeks (around 05/05/2022), or LROB. ? ?Future Appointments  ?Date Time Provider [place]  ?04/27/2022 10:30 AM [facility] NURSE [facility]  ?04/27/2022 10:45 AM [facility] US5 [facility]  ?05/10/2022  8:55 AM Woodroe Mode, MD [facility] None  ? ? ?Starr Lake, CNM ? ?

## 2022-04-07 ENCOUNTER — Institutional Professional Consult (permissible substitution): Payer: Medicaid Other | Admitting: Licensed Clinical Social Worker

## 2022-04-07 ENCOUNTER — Encounter: Payer: Self-pay | Admitting: Student

## 2022-04-07 ENCOUNTER — Ambulatory Visit (INDEPENDENT_AMBULATORY_CARE_PROVIDER_SITE_OTHER): Payer: Medicaid Other | Admitting: Student

## 2022-04-07 VITALS — BP 132/73 | HR 93 | Wt 210.0 lb

## 2022-04-07 DIAGNOSIS — Z3A16 16 weeks gestation of pregnancy: Secondary | ICD-10-CM

## 2022-04-07 DIAGNOSIS — Z3482 Encounter for supervision of other normal pregnancy, second trimester: Secondary | ICD-10-CM

## 2022-04-09 LAB — AFP, SERUM, OPEN SPINA BIFIDA
AFP MoM: 0.96
AFP Value: 30.5 ng/mL
Gest. Age on Collection Date: 16.2 weeks
Maternal Age At EDD: 24.9 yr
OSBR Risk 1 IN: 10000
Test Results:: NEGATIVE
Weight: 210 [lb_av]

## 2022-04-27 ENCOUNTER — Other Ambulatory Visit: Payer: Self-pay | Admitting: Obstetrics and Gynecology

## 2022-04-27 ENCOUNTER — Ambulatory Visit: Payer: Medicaid Other | Attending: Maternal & Fetal Medicine

## 2022-04-27 ENCOUNTER — Ambulatory Visit: Payer: Medicaid Other | Admitting: *Deleted

## 2022-04-27 ENCOUNTER — Encounter: Payer: Self-pay | Admitting: *Deleted

## 2022-04-27 ENCOUNTER — Other Ambulatory Visit: Payer: Self-pay | Admitting: *Deleted

## 2022-04-27 ENCOUNTER — Ambulatory Visit: Payer: Medicaid Other | Attending: Obstetrics and Gynecology

## 2022-04-27 ENCOUNTER — Ambulatory Visit (HOSPITAL_BASED_OUTPATIENT_CLINIC_OR_DEPARTMENT_OTHER): Payer: Medicaid Other | Admitting: Maternal & Fetal Medicine

## 2022-04-27 VITALS — BP 124/52 | HR 78

## 2022-04-27 DIAGNOSIS — Z3481 Encounter for supervision of other normal pregnancy, first trimester: Secondary | ICD-10-CM

## 2022-04-27 DIAGNOSIS — O283 Abnormal ultrasonic finding on antenatal screening of mother: Secondary | ICD-10-CM | POA: Diagnosis not present

## 2022-04-27 DIAGNOSIS — O09212 Supervision of pregnancy with history of pre-term labor, second trimester: Secondary | ICD-10-CM | POA: Diagnosis not present

## 2022-04-27 DIAGNOSIS — Z6831 Body mass index (BMI) 31.0-31.9, adult: Secondary | ICD-10-CM

## 2022-04-27 DIAGNOSIS — Z8759 Personal history of other complications of pregnancy, childbirth and the puerperium: Secondary | ICD-10-CM | POA: Insufficient documentation

## 2022-04-27 DIAGNOSIS — O09892 Supervision of other high risk pregnancies, second trimester: Secondary | ICD-10-CM

## 2022-04-27 DIAGNOSIS — O4402 Placenta previa specified as without hemorrhage, second trimester: Secondary | ICD-10-CM

## 2022-04-27 DIAGNOSIS — O09292 Supervision of pregnancy with other poor reproductive or obstetric history, second trimester: Secondary | ICD-10-CM | POA: Diagnosis not present

## 2022-04-27 DIAGNOSIS — Z363 Encounter for antenatal screening for malformations: Secondary | ICD-10-CM | POA: Diagnosis not present

## 2022-04-27 DIAGNOSIS — O321XX0 Maternal care for breech presentation, not applicable or unspecified: Secondary | ICD-10-CM | POA: Insufficient documentation

## 2022-04-27 DIAGNOSIS — O99212 Obesity complicating pregnancy, second trimester: Secondary | ICD-10-CM | POA: Diagnosis not present

## 2022-04-27 DIAGNOSIS — O99213 Obesity complicating pregnancy, third trimester: Secondary | ICD-10-CM

## 2022-04-27 DIAGNOSIS — Z3A19 19 weeks gestation of pregnancy: Secondary | ICD-10-CM | POA: Insufficient documentation

## 2022-04-27 NOTE — Progress Notes (Signed)
MFM Brief Note  Adriana [name] is 26 yo G4P2 who is seen at 19w 1d at the request of Dr. Mariel Aloe for a detailed exam and CL given prior preterm birth at 36 weeks  She is doing well today. She declined genetic screening as she did not want to know gender.  Single intrauterine pregnancy here for a detailed anatomy Normal anatomy with measurements consistent with small for gestational age (EFW 13%). There is good fetal movement and amniotic fluid volume Suboptimal views of the fetal anatomy were obtained secondary to fetal position.  A placenta previa was observed with ~1 cm of placental edge covering the internal os.  I discussed the following regarding a posterior placenta previa We discussed the fact that placenta previa is associated with an increased risk of bleeding, inpatient stay, emergency cesarean delivery, preterm birth, and bleeding precautions were reviewed. We advised pelvic rest. Should this condition fail to resolve, we discussed the necessity of cesarean delivery.   Today's sonogram demonstrates no evidence of invasive placentation.   An echogenic intracardiac focus was observed today. I discussed that there is no increased risk to the function or structure of the heart. In addition, in the context of a low risk NIPS result the risk for aneuploidy are reduced. There were no additional markers of aneuploidy observed.  I reviewed that an ultrasound is a screening exam and that a diagnostic exam via amnioticenetesis is the only test available to provide a definitive result.  Ms. Bradt opted to have an NIPT drawn today.  Follow up growth in 3 weeks given the EFW 13% and Growth in 8 weeks to assess the placental edge.  All questions were answered.   I spent 20 minutes with > 50% in face to face consultation.  Novella Olive, MD

## 2022-04-28 ENCOUNTER — Encounter: Payer: Self-pay | Admitting: Obstetrics and Gynecology

## 2022-04-28 DIAGNOSIS — O4400 Placenta previa specified as without hemorrhage, unspecified trimester: Secondary | ICD-10-CM | POA: Insufficient documentation

## 2022-05-04 ENCOUNTER — Telehealth: Payer: Self-pay | Admitting: Genetics

## 2022-05-04 NOTE — Telephone Encounter (Signed)
Adriana [name] was contacted by telephone to review their noninvasive prenatal screening (NIPS) result. The result is low risk. This screening significantly reduces the risk that the current pregnancy has Down syndrome, Trisomy 50, Trisomy 13, Monosomy X, and Triploidy. Gladies understands that this is a screening and not a diagnostic test. All questions answered.

## 2022-05-09 ENCOUNTER — Other Ambulatory Visit: Payer: Self-pay

## 2022-05-10 ENCOUNTER — Ambulatory Visit (INDEPENDENT_AMBULATORY_CARE_PROVIDER_SITE_OTHER): Payer: Medicaid Other | Admitting: Obstetrics & Gynecology

## 2022-05-10 VITALS — BP 123/75 | HR 90 | Wt 212.0 lb

## 2022-05-10 DIAGNOSIS — O4400 Placenta previa specified as without hemorrhage, unspecified trimester: Secondary | ICD-10-CM

## 2022-05-10 DIAGNOSIS — Z8759 Personal history of other complications of pregnancy, childbirth and the puerperium: Secondary | ICD-10-CM

## 2022-05-10 DIAGNOSIS — Z3482 Encounter for supervision of other normal pregnancy, second trimester: Secondary | ICD-10-CM

## 2022-05-10 NOTE — Progress Notes (Signed)
   PRENATAL VISIT NOTE  Subjective:  Adriana [name] is a 26 y.o. [phone] at [redacted]w[redacted]d being seen today for ongoing prenatal care.  She is currently monitored for the following issues for this high-risk pregnancy and has Migraine headache with aura; History of IUFD; Supervision of normal pregnancy; Constipation in pregnancy; and Placenta previa antepartum on their problem list.  Patient reports no complaints.  Contractions: Not present. Vag. Bleeding: None.  Movement: Present. Denies leaking of fluid.   The following portions of the patient's history were reviewed and updated as appropriate: allergies, current medications, past family history, past medical history, past social history, past surgical history and problem list.   Objective:   Vitals:   05/10/22 0900  BP: 123/75  Pulse: 90  Weight: 212 lb (96.2 kg)    Fetal Status:     Movement: Present     General:  Alert, oriented and cooperative. Patient is in no acute distress.  Skin: Skin is warm and dry. No rash noted.   Cardiovascular: Normal heart rate noted  Respiratory: Normal respiratory effort, no problems with respiration noted  Abdomen: Soft, gravid, appropriate for gestational age.  Pain/Pressure: Present     Pelvic: Cervical exam deferred        Extremities: Normal range of motion.     Mental Status: Normal mood and affect. Normal behavior. Normal judgment and thought content.   Assessment and Plan:  Pregnancy: [id number] at [redacted]w[redacted]d 1. Placenta previa antepartum Placenta previa  2. History of IUFD MFM f/u  3. Encounter for supervision of other normal pregnancy in second trimester   Preterm labor symptoms and general obstetric precautions including but not limited to vaginal bleeding, contractions, leaking of fluid and fetal movement were reviewed in detail with the patient. Please refer to After Visit Summary for other counseling recommendations.   Return in about 4 weeks (around 06/07/2022).  Future Appointments  Date  Time Provider [place]  05/18/2022  7:30 AM [facility] NURSE [facility]  05/18/2022  7:45 AM [facility] US6 [facility]  06/22/2022  9:15 AM [facility] NURSE [facility]  06/22/2022  9:30 AM [facility] US3 [facility]    Emeterio Reeve, MD

## 2022-05-18 ENCOUNTER — Encounter: Payer: Self-pay | Admitting: *Deleted

## 2022-05-18 ENCOUNTER — Ambulatory Visit: Payer: Medicaid Other | Admitting: *Deleted

## 2022-05-18 ENCOUNTER — Ambulatory Visit: Payer: Medicaid Other | Attending: Maternal & Fetal Medicine

## 2022-05-18 VITALS — BP 120/54 | HR 70

## 2022-05-18 DIAGNOSIS — O99212 Obesity complicating pregnancy, second trimester: Secondary | ICD-10-CM | POA: Insufficient documentation

## 2022-05-18 DIAGNOSIS — O09292 Supervision of pregnancy with other poor reproductive or obstetric history, second trimester: Secondary | ICD-10-CM | POA: Diagnosis present

## 2022-05-18 DIAGNOSIS — E669 Obesity, unspecified: Secondary | ICD-10-CM

## 2022-05-18 DIAGNOSIS — Z3A22 22 weeks gestation of pregnancy: Secondary | ICD-10-CM

## 2022-05-18 DIAGNOSIS — Z3482 Encounter for supervision of other normal pregnancy, second trimester: Secondary | ICD-10-CM | POA: Insufficient documentation

## 2022-05-18 DIAGNOSIS — Z8759 Personal history of other complications of pregnancy, childbirth and the puerperium: Secondary | ICD-10-CM | POA: Insufficient documentation

## 2022-05-18 DIAGNOSIS — O09892 Supervision of other high risk pregnancies, second trimester: Secondary | ICD-10-CM | POA: Insufficient documentation

## 2022-05-18 DIAGNOSIS — O99213 Obesity complicating pregnancy, third trimester: Secondary | ICD-10-CM | POA: Insufficient documentation

## 2022-05-18 DIAGNOSIS — O09212 Supervision of pregnancy with history of pre-term labor, second trimester: Secondary | ICD-10-CM | POA: Diagnosis not present

## 2022-05-18 DIAGNOSIS — O283 Abnormal ultrasonic finding on antenatal screening of mother: Secondary | ICD-10-CM | POA: Insufficient documentation

## 2022-06-01 ENCOUNTER — Encounter: Payer: Medicaid Other | Admitting: Obstetrics and Gynecology

## 2022-06-08 ENCOUNTER — Ambulatory Visit (INDEPENDENT_AMBULATORY_CARE_PROVIDER_SITE_OTHER): Payer: Medicaid Other | Admitting: Family Medicine

## 2022-06-08 ENCOUNTER — Encounter: Payer: Self-pay | Admitting: Family Medicine

## 2022-06-08 VITALS — BP 118/60 | HR 80 | Wt 216.0 lb

## 2022-06-08 DIAGNOSIS — O4400 Placenta previa specified as without hemorrhage, unspecified trimester: Secondary | ICD-10-CM

## 2022-06-08 DIAGNOSIS — Z3482 Encounter for supervision of other normal pregnancy, second trimester: Secondary | ICD-10-CM

## 2022-06-08 DIAGNOSIS — Z8759 Personal history of other complications of pregnancy, childbirth and the puerperium: Secondary | ICD-10-CM

## 2022-06-08 NOTE — Progress Notes (Signed)
   PRENATAL VISIT NOTE  Subjective:  Adriana [name] is a 26 y.o. [phone] at [redacted]w[redacted]d being seen today for ongoing prenatal care.  She is currently monitored for the following issues for this low-risk pregnancy and has Migraine headache with aura; History of IUFD; Supervision of normal pregnancy; Constipation in pregnancy; and Placenta previa antepartum on their problem list.  Patient reports  increased pelvic pressure .  Contractions: Irritability. Vag. Bleeding: None.  Movement: Present. Denies leaking of fluid.   The following portions of the patient's history were reviewed and updated as appropriate: allergies, current medications, past family history, past medical history, past social history, past surgical history and problem list.   Objective:   Vitals:   06/08/22 1113  BP: 118/60  Pulse: 80  Weight: 216 lb (98 kg)    Fetal Status: Fetal Heart Rate (bpm): 157   Movement: Present     General:  Alert, oriented and cooperative. Patient is in no acute distress.  Skin: Skin is warm and dry. No rash noted.   Cardiovascular: Normal heart rate noted  Respiratory: Normal respiratory effort, no problems with respiration noted  Abdomen: Soft, gravid, appropriate for gestational age.  Pain/Pressure: Present     Pelvic: Cervical exam deferred        Extremities: Normal range of motion.  Edema: Trace  Mental Status: Normal mood and affect. Normal behavior. Normal judgment and thought content.   Assessment and Plan:  Pregnancy: [id number] at [redacted]w[redacted]d 1. Encounter for supervision of other normal pregnancy in second trimester Interested in waterbirth. Will need appt with midwife. Discussed that waterbirth depends on placental location  2. Placenta previa antepartum Marginal placenta. Recheck later this month  3. History of IUFD   Preterm labor symptoms and general obstetric precautions including but not limited to vaginal bleeding, contractions, leaking of fluid and fetal movement were reviewed  in detail with the patient. Please refer to After Visit Summary for other counseling recommendations.   No follow-ups on file.  Future Appointments  Date Time Provider Department Center  06/22/2022  9:15 AM [facility] NURSE [facility]  06/22/2022  9:30 AM [facility] US3 [facility]    Levie Heritage, DO

## 2022-06-08 NOTE — Progress Notes (Signed)
Pt presents ROB without complaints today.

## 2022-06-22 ENCOUNTER — Ambulatory Visit: Payer: Medicaid Other | Attending: Maternal & Fetal Medicine

## 2022-06-22 ENCOUNTER — Other Ambulatory Visit: Payer: Self-pay | Admitting: *Deleted

## 2022-06-22 ENCOUNTER — Ambulatory Visit: Payer: Medicaid Other | Admitting: *Deleted

## 2022-06-22 ENCOUNTER — Other Ambulatory Visit: Payer: Self-pay | Admitting: Maternal & Fetal Medicine

## 2022-06-22 VITALS — BP 121/75 | HR 79

## 2022-06-22 DIAGNOSIS — E669 Obesity, unspecified: Secondary | ICD-10-CM | POA: Diagnosis not present

## 2022-06-22 DIAGNOSIS — O09892 Supervision of other high risk pregnancies, second trimester: Secondary | ICD-10-CM | POA: Insufficient documentation

## 2022-06-22 DIAGNOSIS — O09292 Supervision of pregnancy with other poor reproductive or obstetric history, second trimester: Secondary | ICD-10-CM | POA: Diagnosis not present

## 2022-06-22 DIAGNOSIS — O283 Abnormal ultrasonic finding on antenatal screening of mother: Secondary | ICD-10-CM

## 2022-06-22 DIAGNOSIS — Z3689 Encounter for other specified antenatal screening: Secondary | ICD-10-CM | POA: Insufficient documentation

## 2022-06-22 DIAGNOSIS — O99212 Obesity complicating pregnancy, second trimester: Secondary | ICD-10-CM | POA: Diagnosis not present

## 2022-06-22 DIAGNOSIS — Z8759 Personal history of other complications of pregnancy, childbirth and the puerperium: Secondary | ICD-10-CM

## 2022-06-22 DIAGNOSIS — O09899 Supervision of other high risk pregnancies, unspecified trimester: Secondary | ICD-10-CM

## 2022-06-22 DIAGNOSIS — O99213 Obesity complicating pregnancy, third trimester: Secondary | ICD-10-CM

## 2022-06-22 DIAGNOSIS — Z3A27 27 weeks gestation of pregnancy: Secondary | ICD-10-CM

## 2022-07-05 NOTE — Progress Notes (Signed)
   PRENATAL VISIT NOTE  Subjective:  Adriana [name] is a 26 y.o. [phone] at [redacted]w[redacted]d being seen today for ongoing prenatal care.  She is currently monitored for the following issues for this high-risk pregnancy and has Migraine headache with aura; History of IUFD; Supervision of normal pregnancy; Constipation in pregnancy; and Placenta previa antepartum on their problem list.  Patient reports no complaints.  Contractions: Irritability. Vag. Bleeding: None.  Movement: Present. Denies leaking of fluid.   The following portions of the patient's history were reviewed and updated as appropriate: allergies, current medications, past family history, past medical history, past social history, past surgical history and problem list.   Objective:   Vitals:   07/08/22 0855  BP: 132/86  Pulse: 100  Weight: 218 lb (98.9 kg)    Fetal Status:   Fundal Height: 29 cm Movement: Present     General:  Alert, oriented and cooperative. Patient is in no acute distress.  Skin: Skin is warm and dry. No rash noted.   Cardiovascular: Normal heart rate noted  Respiratory: Normal respiratory effort, no problems with respiration noted  Abdomen: Soft, gravid, appropriate for gestational age.  Pain/Pressure: Absent     Pelvic: Cervical exam deferred        Extremities: Normal range of motion.  Edema: Trace  Mental Status: Normal mood and affect. Normal behavior. Normal judgment and thought content.   Assessment and Plan:  Pregnancy: [id number] at [redacted]w[redacted]d 1. History of IUFD She would like IOL at 37w - she may also want to still do waterbirth. Reviewed pitocin would make it such that she wouldn't be able to be in the tub, but it is possible to start induction and remove these things and then she continues to labor on her own Has ongoing growth scheduled  2. Encounter for supervision of other normal pregnancy in second trimester 28w labs today Offered and recommended tdap - pt declines today but will likely get next  time Rh positive  3. Placenta previa antepartum Resolved, not low lying either.   Preterm labor symptoms and general obstetric precautions including but not limited to vaginal bleeding, contractions, leaking of fluid and fetal movement were reviewed in detail with the patient. Please refer to After Visit Summary for other counseling recommendations.   Return in about 2 weeks (around 07/22/2022) for OB VISIT, MD or APP.  Future Appointments  Date Time Provider Department Center  07/13/2022  9:45 AM [facility] NURSE [facility]  07/13/2022 10:00 AM [facility] US1 [facility]  07/19/2022  9:45 AM [facility] NURSE [facility]  07/19/2022 10:00 AM [facility] US1 [facility]  07/25/2022  8:30 AM [facility] NURSE [facility]  07/25/2022  8:45 AM [facility] US5 [facility]    Milas Hock, MD

## 2022-07-07 ENCOUNTER — Encounter: Payer: Medicaid Other | Admitting: Obstetrics & Gynecology

## 2022-07-07 ENCOUNTER — Other Ambulatory Visit: Payer: Medicaid Other

## 2022-07-08 ENCOUNTER — Other Ambulatory Visit: Payer: Medicaid Other

## 2022-07-08 ENCOUNTER — Ambulatory Visit (INDEPENDENT_AMBULATORY_CARE_PROVIDER_SITE_OTHER): Payer: Medicaid Other | Admitting: Obstetrics and Gynecology

## 2022-07-08 VITALS — BP 132/86 | HR 100 | Wt 218.0 lb

## 2022-07-08 DIAGNOSIS — Z3A29 29 weeks gestation of pregnancy: Secondary | ICD-10-CM

## 2022-07-08 DIAGNOSIS — O4400 Placenta previa specified as without hemorrhage, unspecified trimester: Secondary | ICD-10-CM

## 2022-07-08 DIAGNOSIS — O99013 Anemia complicating pregnancy, third trimester: Secondary | ICD-10-CM

## 2022-07-08 DIAGNOSIS — A6000 Herpesviral infection of urogenital system, unspecified: Secondary | ICD-10-CM

## 2022-07-08 DIAGNOSIS — Z3483 Encounter for supervision of other normal pregnancy, third trimester: Secondary | ICD-10-CM

## 2022-07-08 DIAGNOSIS — Z8759 Personal history of other complications of pregnancy, childbirth and the puerperium: Secondary | ICD-10-CM

## 2022-07-08 DIAGNOSIS — O4403 Placenta previa specified as without hemorrhage, third trimester: Secondary | ICD-10-CM

## 2022-07-08 DIAGNOSIS — Z3482 Encounter for supervision of other normal pregnancy, second trimester: Secondary | ICD-10-CM

## 2022-07-08 NOTE — Progress Notes (Signed)
Pt presents for ROB at [redacted]w[redacted]d.  Endorses +FM, denies ctx, LOF, vaginal bleeding.  2hr Gtt, 28 week labs drawn today. Declines TDAP No abnormal concerns. Wants to discuss birth plan.

## 2022-07-09 LAB — RPR: RPR Ser Ql: NONREACTIVE

## 2022-07-09 LAB — GLUCOSE TOLERANCE, 2 HOURS W/ 1HR
Glucose, 1 hour: 116 mg/dL (ref 70–179)
Glucose, 2 hour: 97 mg/dL (ref 70–152)
Glucose, Fasting: 79 mg/dL (ref 70–91)

## 2022-07-09 LAB — CBC
Hematocrit: 31 % — ABNORMAL LOW (ref 34.0–46.6)
Hemoglobin: 10.1 g/dL — ABNORMAL LOW (ref 11.1–15.9)
MCH: 31.2 pg (ref 26.6–33.0)
MCHC: 32.6 g/dL (ref 31.5–35.7)
MCV: 96 fL (ref 79–97)
Platelets: 292 10*3/uL (ref 150–450)
RBC: 3.24 x10E6/uL — ABNORMAL LOW (ref 3.77–5.28)
RDW: 12.5 % (ref 11.7–15.4)
WBC: 6.4 10*3/uL (ref 3.4–10.8)

## 2022-07-09 LAB — HIV ANTIBODY (ROUTINE TESTING W REFLEX): HIV Screen 4th Generation wRfx: NONREACTIVE

## 2022-07-09 MED ORDER — FERROUS SULFATE 325 (65 FE) MG PO TABS: 325.0000 mg | ORAL_TABLET | ORAL | 3 refills | Status: DC

## 2022-07-09 NOTE — Addendum Note (Signed)
Addended by: Milas Hock A on: 07/09/2022 09:07 AM   Modules accepted: Orders

## 2022-07-13 ENCOUNTER — Ambulatory Visit: Payer: Medicaid Other | Attending: Obstetrics

## 2022-07-13 ENCOUNTER — Other Ambulatory Visit: Payer: Self-pay | Admitting: *Deleted

## 2022-07-13 ENCOUNTER — Ambulatory Visit: Payer: Medicaid Other | Admitting: *Deleted

## 2022-07-13 VITALS — BP 126/71 | HR 84

## 2022-07-13 DIAGNOSIS — O283 Abnormal ultrasonic finding on antenatal screening of mother: Secondary | ICD-10-CM | POA: Insufficient documentation

## 2022-07-13 DIAGNOSIS — O09213 Supervision of pregnancy with history of pre-term labor, third trimester: Secondary | ICD-10-CM

## 2022-07-13 DIAGNOSIS — Z3689 Encounter for other specified antenatal screening: Secondary | ICD-10-CM

## 2022-07-13 DIAGNOSIS — Z8759 Personal history of other complications of pregnancy, childbirth and the puerperium: Secondary | ICD-10-CM

## 2022-07-13 DIAGNOSIS — O09899 Supervision of other high risk pregnancies, unspecified trimester: Secondary | ICD-10-CM | POA: Insufficient documentation

## 2022-07-13 DIAGNOSIS — O09293 Supervision of pregnancy with other poor reproductive or obstetric history, third trimester: Secondary | ICD-10-CM

## 2022-07-13 DIAGNOSIS — Z3A30 30 weeks gestation of pregnancy: Secondary | ICD-10-CM

## 2022-07-13 DIAGNOSIS — E669 Obesity, unspecified: Secondary | ICD-10-CM

## 2022-07-13 DIAGNOSIS — O99213 Obesity complicating pregnancy, third trimester: Secondary | ICD-10-CM | POA: Diagnosis not present

## 2022-07-13 DIAGNOSIS — O99212 Obesity complicating pregnancy, second trimester: Secondary | ICD-10-CM | POA: Insufficient documentation

## 2022-07-13 DIAGNOSIS — O365990 Maternal care for other known or suspected poor fetal growth, unspecified trimester, not applicable or unspecified: Secondary | ICD-10-CM

## 2022-07-19 ENCOUNTER — Ambulatory Visit: Payer: Medicaid Other | Admitting: *Deleted

## 2022-07-19 ENCOUNTER — Ambulatory Visit: Payer: Medicaid Other | Attending: Obstetrics

## 2022-07-19 ENCOUNTER — Encounter: Payer: Self-pay | Admitting: *Deleted

## 2022-07-19 VITALS — BP 117/60 | HR 81

## 2022-07-19 DIAGNOSIS — O09899 Supervision of other high risk pregnancies, unspecified trimester: Secondary | ICD-10-CM | POA: Insufficient documentation

## 2022-07-19 DIAGNOSIS — Z3482 Encounter for supervision of other normal pregnancy, second trimester: Secondary | ICD-10-CM

## 2022-07-19 DIAGNOSIS — O99212 Obesity complicating pregnancy, second trimester: Secondary | ICD-10-CM | POA: Insufficient documentation

## 2022-07-19 DIAGNOSIS — O283 Abnormal ultrasonic finding on antenatal screening of mother: Secondary | ICD-10-CM | POA: Insufficient documentation

## 2022-07-19 DIAGNOSIS — O99213 Obesity complicating pregnancy, third trimester: Secondary | ICD-10-CM | POA: Insufficient documentation

## 2022-07-19 DIAGNOSIS — Z3A31 31 weeks gestation of pregnancy: Secondary | ICD-10-CM | POA: Diagnosis not present

## 2022-07-19 DIAGNOSIS — O09213 Supervision of pregnancy with history of pre-term labor, third trimester: Secondary | ICD-10-CM | POA: Diagnosis not present

## 2022-07-19 DIAGNOSIS — O09293 Supervision of pregnancy with other poor reproductive or obstetric history, third trimester: Secondary | ICD-10-CM | POA: Insufficient documentation

## 2022-07-19 DIAGNOSIS — E669 Obesity, unspecified: Secondary | ICD-10-CM

## 2022-07-19 DIAGNOSIS — Z8759 Personal history of other complications of pregnancy, childbirth and the puerperium: Secondary | ICD-10-CM | POA: Insufficient documentation

## 2022-07-25 ENCOUNTER — Other Ambulatory Visit: Payer: Self-pay | Admitting: *Deleted

## 2022-07-25 ENCOUNTER — Ambulatory Visit: Payer: Medicaid Other | Admitting: *Deleted

## 2022-07-25 ENCOUNTER — Encounter: Payer: Self-pay | Admitting: *Deleted

## 2022-07-25 ENCOUNTER — Ambulatory Visit: Payer: Medicaid Other | Attending: Obstetrics

## 2022-07-25 VITALS — BP 126/58 | HR 76

## 2022-07-25 DIAGNOSIS — Z6831 Body mass index (BMI) 31.0-31.9, adult: Secondary | ICD-10-CM

## 2022-07-25 DIAGNOSIS — Z8759 Personal history of other complications of pregnancy, childbirth and the puerperium: Secondary | ICD-10-CM | POA: Diagnosis present

## 2022-07-25 DIAGNOSIS — O09213 Supervision of pregnancy with history of pre-term labor, third trimester: Secondary | ICD-10-CM

## 2022-07-25 DIAGNOSIS — O09293 Supervision of pregnancy with other poor reproductive or obstetric history, third trimester: Secondary | ICD-10-CM | POA: Diagnosis not present

## 2022-07-25 DIAGNOSIS — O99213 Obesity complicating pregnancy, third trimester: Secondary | ICD-10-CM | POA: Diagnosis not present

## 2022-07-25 DIAGNOSIS — O283 Abnormal ultrasonic finding on antenatal screening of mother: Secondary | ICD-10-CM | POA: Insufficient documentation

## 2022-07-25 DIAGNOSIS — E669 Obesity, unspecified: Secondary | ICD-10-CM

## 2022-07-25 DIAGNOSIS — Z3A31 31 weeks gestation of pregnancy: Secondary | ICD-10-CM

## 2022-07-25 DIAGNOSIS — O09899 Supervision of other high risk pregnancies, unspecified trimester: Secondary | ICD-10-CM | POA: Diagnosis present

## 2022-07-25 DIAGNOSIS — O99212 Obesity complicating pregnancy, second trimester: Secondary | ICD-10-CM | POA: Diagnosis present

## 2022-07-26 ENCOUNTER — Ambulatory Visit: Payer: Medicaid Other

## 2022-07-29 ENCOUNTER — Encounter: Payer: Self-pay | Admitting: Family Medicine

## 2022-07-29 ENCOUNTER — Ambulatory Visit (INDEPENDENT_AMBULATORY_CARE_PROVIDER_SITE_OTHER): Payer: Medicaid Other | Admitting: Family Medicine

## 2022-07-29 VITALS — BP 123/75 | HR 78 | Wt 221.8 lb

## 2022-07-29 DIAGNOSIS — O0993 Supervision of high risk pregnancy, unspecified, third trimester: Secondary | ICD-10-CM

## 2022-07-29 DIAGNOSIS — K5900 Constipation, unspecified: Secondary | ICD-10-CM

## 2022-07-29 DIAGNOSIS — Z3A32 32 weeks gestation of pregnancy: Secondary | ICD-10-CM

## 2022-07-29 DIAGNOSIS — O99613 Diseases of the digestive system complicating pregnancy, third trimester: Secondary | ICD-10-CM

## 2022-07-29 DIAGNOSIS — Z8759 Personal history of other complications of pregnancy, childbirth and the puerperium: Secondary | ICD-10-CM

## 2022-07-29 DIAGNOSIS — O0990 Supervision of high risk pregnancy, unspecified, unspecified trimester: Secondary | ICD-10-CM

## 2022-07-29 NOTE — Progress Notes (Signed)
PRENATAL VISIT NOTE  Subjective:  Adriana [name] is a 26 y.o. [phone] at [redacted]w[redacted]d being seen today for ongoing prenatal care.  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  Patient reports no complaints.  Contractions: Irritability. Vag. Bleeding: None.  Movement: Present. Denies leaking of fluid.   The following portions of the patient's history were reviewed and updated as appropriate: allergies, current medications, past family history, past medical history, past social history, past surgical history and problem list.   Objective:   Vitals:   07/29/22 0855  BP: 123/75  Pulse: 78  Weight: 221 lb 12.8 oz (100.6 kg)    Fetal Status: Fetal Heart Rate (bpm): 152   Movement: Present     General:  Alert, oriented and cooperative. Patient is in no acute distress.  Skin: Skin is warm and dry. No rash noted.   Cardiovascular: Normal heart rate noted  Respiratory: Normal respiratory effort, no problems with respiration noted  Abdomen: Soft, gravid, appropriate for gestational age.  Pain/Pressure: Present     Pelvic: Cervical exam deferred        Extremities: Normal range of motion.  Edema: Trace  Mental Status: Normal mood and affect. Normal behavior. Normal judgment and thought content.   Assessment and Plan:  Pregnancy: [id number] at [redacted]w[redacted]d 1. Constipation during pregnancy in third trimester resolved  2. History of IUFD MFM recommends IOL at 37-38 weeks due to 39 wk IUFD Has antenatal testing in place Discussed at length IOL at 37-38 wks with desire for [facility] Recommended outpatient FB  Discussed that if in labor on her own with cytotec, FB, possibly AROM, still is able to birth in water but if not having strong regular contractions esp if AROM performed we would recommend pitocin and this  means she cannot birth in water. Discussed waiting 6-8 hrs after AROM for ctx if not happening we recommend pitocin.   3. Supervision of high risk pregnancy, antepartum - Pt is interested in waterbirth.  No contraindications at this time per chart review/patient assessment.   - Pt to enroll in class, see CNMs for most visits in the office.  Has calss scheduled on 9/21 - Discussed waterbirth as option for low-risk pregnancy.  Reviewed conditions that may arise during pregnancy that will risk pt out of waterbirth including hypertension, diabetes, fetal growth restriction <10%ile, etc.  Having some back pain, discussed methods to help with this-yoga, massage, chirorpractice  Preterm labor symptoms and general obstetric precautions including but not limited to vaginal bleeding, contractions, leaking of fluid and fetal movement were reviewed in detail with the patient. Please refer to After Visit Summary for other counseling recommendations.   Return in about 2 weeks (around 08/12/2022) for Routine prenatal care.  Future Appointments  Date Time Provider Department Center  08/04/2022 11:15 AM [facility] NURSE [facility]  08/04/2022 11:30 AM [facility] US2 [facility]  08/08/2022  8:55 AM Warden Fillers, MD [facility] None  08/08/2022  9:45 AM [facility] NURSE [facility]  08/08/2022 10:00 AM [facility] US1 [facility]  08/15/2022 10:15 AM [facility] NURSE [facility]  08/15/2022 10:30 AM [facility] US2 [facility]  08/22/2022  8:35 AM Constant, Peggy, MD [facility] None  08/22/2022 10:45 AM [facility] NURSE [facility]  08/22/2022 11:00 AM [facility] US1 [facility]  08/29/2022  8:55 AM Constant, Gigi Gin, MD [facility]  None  08/29/2022  9:30 AM [facility] NURSE [facility]  08/29/2022  9:45 AM [facility] US4 [facility]  09/05/2022  8:35 AM Constant, Gigi Gin, MD [facility] None  09/12/2022  8:35 AM Constant, Gigi Gin, MD [facility] None  09/19/2022  8:35 AM Constant, Gigi Gin, MD [facility] None    Federico Flake, MD

## 2022-07-29 NOTE — Patient Instructions (Signed)

## 2022-08-04 ENCOUNTER — Ambulatory Visit: Payer: Medicaid Other | Admitting: *Deleted

## 2022-08-04 ENCOUNTER — Ambulatory Visit: Payer: Medicaid Other | Attending: Maternal & Fetal Medicine

## 2022-08-04 ENCOUNTER — Encounter: Payer: Self-pay | Admitting: *Deleted

## 2022-08-04 VITALS — BP 116/61 | HR 79

## 2022-08-04 DIAGNOSIS — O99213 Obesity complicating pregnancy, third trimester: Secondary | ICD-10-CM | POA: Diagnosis not present

## 2022-08-04 DIAGNOSIS — Z3689 Encounter for other specified antenatal screening: Secondary | ICD-10-CM | POA: Insufficient documentation

## 2022-08-04 DIAGNOSIS — O0990 Supervision of high risk pregnancy, unspecified, unspecified trimester: Secondary | ICD-10-CM | POA: Insufficient documentation

## 2022-08-04 DIAGNOSIS — Z8759 Personal history of other complications of pregnancy, childbirth and the puerperium: Secondary | ICD-10-CM | POA: Diagnosis present

## 2022-08-04 DIAGNOSIS — O365990 Maternal care for other known or suspected poor fetal growth, unspecified trimester, not applicable or unspecified: Secondary | ICD-10-CM | POA: Diagnosis present

## 2022-08-04 DIAGNOSIS — E669 Obesity, unspecified: Secondary | ICD-10-CM | POA: Diagnosis not present

## 2022-08-04 DIAGNOSIS — O09213 Supervision of pregnancy with history of pre-term labor, third trimester: Secondary | ICD-10-CM | POA: Diagnosis not present

## 2022-08-04 DIAGNOSIS — O09293 Supervision of pregnancy with other poor reproductive or obstetric history, third trimester: Secondary | ICD-10-CM

## 2022-08-04 DIAGNOSIS — Z3A33 33 weeks gestation of pregnancy: Secondary | ICD-10-CM

## 2022-08-08 ENCOUNTER — Encounter: Payer: Self-pay | Admitting: Obstetrics and Gynecology

## 2022-08-08 ENCOUNTER — Ambulatory Visit: Payer: Medicaid Other | Admitting: *Deleted

## 2022-08-08 ENCOUNTER — Other Ambulatory Visit: Payer: Self-pay | Admitting: Maternal & Fetal Medicine

## 2022-08-08 ENCOUNTER — Encounter: Payer: Self-pay | Admitting: *Deleted

## 2022-08-08 ENCOUNTER — Ambulatory Visit: Payer: Medicaid Other | Attending: Maternal & Fetal Medicine

## 2022-08-08 ENCOUNTER — Ambulatory Visit (INDEPENDENT_AMBULATORY_CARE_PROVIDER_SITE_OTHER): Payer: Medicaid Other | Admitting: Obstetrics and Gynecology

## 2022-08-08 VITALS — BP 111/64 | HR 75

## 2022-08-08 VITALS — BP 108/69 | HR 85 | Wt 224.9 lb

## 2022-08-08 DIAGNOSIS — Z3689 Encounter for other specified antenatal screening: Secondary | ICD-10-CM | POA: Insufficient documentation

## 2022-08-08 DIAGNOSIS — O09293 Supervision of pregnancy with other poor reproductive or obstetric history, third trimester: Secondary | ICD-10-CM | POA: Diagnosis not present

## 2022-08-08 DIAGNOSIS — Z8759 Personal history of other complications of pregnancy, childbirth and the puerperium: Secondary | ICD-10-CM | POA: Diagnosis present

## 2022-08-08 DIAGNOSIS — Z3A33 33 weeks gestation of pregnancy: Secondary | ICD-10-CM

## 2022-08-08 DIAGNOSIS — O0993 Supervision of high risk pregnancy, unspecified, third trimester: Secondary | ICD-10-CM

## 2022-08-08 DIAGNOSIS — O365990 Maternal care for other known or suspected poor fetal growth, unspecified trimester, not applicable or unspecified: Secondary | ICD-10-CM

## 2022-08-08 DIAGNOSIS — O0990 Supervision of high risk pregnancy, unspecified, unspecified trimester: Secondary | ICD-10-CM | POA: Insufficient documentation

## 2022-08-08 DIAGNOSIS — O99213 Obesity complicating pregnancy, third trimester: Secondary | ICD-10-CM | POA: Diagnosis not present

## 2022-08-08 DIAGNOSIS — A6000 Herpesviral infection of urogenital system, unspecified: Secondary | ICD-10-CM

## 2022-08-08 DIAGNOSIS — E669 Obesity, unspecified: Secondary | ICD-10-CM

## 2022-08-08 NOTE — Procedures (Signed)
Adriana [name] [birth year] [redacted]w[redacted]d  Fetus A Non-Stress Test Interpretation for 08/08/22  Indication:  his IUFD  Fetal Heart Rate A Mode: External Baseline Rate (A): 145 bpm Variability: Moderate Accelerations: 15 x 15 Decelerations: None Multiple birth?: No  Uterine Activity Mode: Toco Contraction Frequency (min): none Resting Tone Palpated: Relaxed Resting Time: Adequate  Interpretation (Fetal Testing) Nonstress Test Interpretation: Reactive Overall Impression: Reassuring for gestational age Comments: tracing reviewed by Dr. Parke Poisson

## 2022-08-08 NOTE — Progress Notes (Signed)
Declined Tdap vaccine

## 2022-08-08 NOTE — Progress Notes (Signed)
   PRENATAL VISIT NOTE  Subjective:  Irina Okelly is a 26 y.o. [phone] at [redacted]w[redacted]d being seen today for ongoing prenatal care.  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  Patient doing well with no acute concerns today. She reports no complaints.  Contractions: Irritability. Vag. Bleeding: None.  Movement: Present. Denies leaking of fluid.   The following portions of the patient's history were reviewed and updated as appropriate: allergies, current medications, past family history, past medical history, past social history, past surgical history and problem list. Problem list updated.  Objective:   Vitals:   08/08/22 0824  BP: 108/69  Pulse: 85  Weight: 224 lb 14.4 oz (102 kg)    Fetal Status: Fetal Heart Rate (bpm): 151 Fundal Height: 34 cm Movement: Present     General:  Alert, oriented and cooperative. Patient is in no acute distress.  Skin: Skin is warm and dry. No rash noted.   Cardiovascular: Normal heart rate noted  Respiratory: Normal respiratory effort, no problems with respiration noted  Abdomen: Soft, gravid, appropriate for gestational age.  Pain/Pressure: Present     Pelvic: Cervical exam deferred        Extremities: Normal range of motion.     Mental Status:  Normal mood and affect. Normal behavior. Normal judgment and thought content.   Assessment and Plan:  Pregnancy: [id number] at [redacted]w[redacted]d  1. [redacted] weeks gestation of pregnancy   2. 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  It is a soft call as the patient has never had an outbreak, but MD would offer prophylaxis since it was associated with a loss  3. History of IUFD Pt has fetal scan today and weekly testing  Delivery at 37-38 weeks.   Pt is leaning toward 38 weeks with waterbirth  4. Supervision of high risk pregnancy, antepartum Continue routine care Pt has waterbirth class this week  Preterm labor symptoms and general obstetric precautions including but not limited to vaginal bleeding, contractions, leaking of fluid and fetal movement were reviewed in detail with the patient.  Please refer to After Visit Summary for other counseling recommendations.   Return in about 2 weeks (around 08/22/2022) for [facility], in person.   Mariel Aloe, MD Faculty Attending Center for [facility]

## 2022-08-15 ENCOUNTER — Encounter: Payer: Self-pay | Admitting: *Deleted

## 2022-08-15 ENCOUNTER — Ambulatory Visit: Payer: Medicaid Other | Admitting: *Deleted

## 2022-08-15 ENCOUNTER — Ambulatory Visit: Payer: Medicaid Other | Attending: Obstetrics

## 2022-08-15 VITALS — BP 115/63 | HR 89

## 2022-08-15 DIAGNOSIS — O0990 Supervision of high risk pregnancy, unspecified, unspecified trimester: Secondary | ICD-10-CM | POA: Insufficient documentation

## 2022-08-15 DIAGNOSIS — Z8759 Personal history of other complications of pregnancy, childbirth and the puerperium: Secondary | ICD-10-CM | POA: Diagnosis present

## 2022-08-15 DIAGNOSIS — Z3A34 34 weeks gestation of pregnancy: Secondary | ICD-10-CM

## 2022-08-15 DIAGNOSIS — O99213 Obesity complicating pregnancy, third trimester: Secondary | ICD-10-CM | POA: Diagnosis not present

## 2022-08-15 DIAGNOSIS — O09293 Supervision of pregnancy with other poor reproductive or obstetric history, third trimester: Secondary | ICD-10-CM

## 2022-08-22 ENCOUNTER — Ambulatory Visit (INDEPENDENT_AMBULATORY_CARE_PROVIDER_SITE_OTHER): Payer: Medicaid Other | Admitting: Obstetrics and Gynecology

## 2022-08-22 ENCOUNTER — Ambulatory Visit: Payer: Medicaid Other | Attending: Obstetrics

## 2022-08-22 ENCOUNTER — Encounter: Payer: Self-pay | Admitting: Obstetrics and Gynecology

## 2022-08-22 ENCOUNTER — Ambulatory Visit: Payer: Medicaid Other | Admitting: *Deleted

## 2022-08-22 ENCOUNTER — Other Ambulatory Visit (HOSPITAL_COMMUNITY)
Admission: RE | Admit: 2022-08-22 | Discharge: 2022-08-22 | Disposition: A | Discharge status: Home / Self Care | Payer: Medicaid Other | Source: Ambulatory Visit | Attending: Obstetrics and Gynecology | Admitting: Obstetrics and Gynecology

## 2022-08-22 VITALS — BP 130/57 | HR 79

## 2022-08-22 VITALS — BP 112/73 | HR 83 | Wt 229.0 lb

## 2022-08-22 DIAGNOSIS — O0990 Supervision of high risk pregnancy, unspecified, unspecified trimester: Secondary | ICD-10-CM

## 2022-08-22 DIAGNOSIS — A6000 Herpesviral infection of urogenital system, unspecified: Secondary | ICD-10-CM

## 2022-08-22 DIAGNOSIS — E669 Obesity, unspecified: Secondary | ICD-10-CM | POA: Diagnosis not present

## 2022-08-22 DIAGNOSIS — O99213 Obesity complicating pregnancy, third trimester: Secondary | ICD-10-CM

## 2022-08-22 DIAGNOSIS — O09293 Supervision of pregnancy with other poor reproductive or obstetric history, third trimester: Secondary | ICD-10-CM | POA: Diagnosis not present

## 2022-08-22 DIAGNOSIS — Z3A35 35 weeks gestation of pregnancy: Secondary | ICD-10-CM

## 2022-08-22 DIAGNOSIS — Z8759 Personal history of other complications of pregnancy, childbirth and the puerperium: Secondary | ICD-10-CM | POA: Diagnosis present

## 2022-08-22 DIAGNOSIS — O09213 Supervision of pregnancy with history of pre-term labor, third trimester: Secondary | ICD-10-CM | POA: Diagnosis not present

## 2022-08-22 DIAGNOSIS — O0993 Supervision of high risk pregnancy, unspecified, third trimester: Secondary | ICD-10-CM

## 2022-08-22 NOTE — Progress Notes (Signed)
   PRENATAL VISIT NOTE  Subjective:  Adriana [name] is a 26 y.o. [id number] at [redacted]w[redacted]d being seen today for ongoing prenatal care.  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  Patient reports no complaints.  Contractions: Not present. Vag. Bleeding: None.  Movement: Present. Denies leaking of fluid.   The following portions of the patient's history were reviewed and updated as appropriate: allergies, current medications, past family history, past medical history, past social history, past surgical history and problem list.   Objective:   Vitals:   08/22/22 0902  BP: 112/73  Pulse: 83  Weight: 229 lb (103.9 kg)    Fetal Status: Fetal Heart Rate (bpm): 145 Fundal Height: 36 cm Movement: Present     General:  Alert, oriented and cooperative. Patient is in no acute distress.  Skin: Skin is warm and dry. No rash noted.   Cardiovascular: Normal heart rate noted  Respiratory: Normal respiratory effort, no problems with respiration noted  Abdomen: Soft, gravid, appropriate for gestational age.  Pain/Pressure: Present     Pelvic: Cervical exam performed in the presence of a chaperone Dilation: Fingertip Effacement (%): Thick Station: Ballotable  Extremities: Normal range of motion.     Mental Status: Normal mood and affect. Normal behavior. Normal judgment and thought content.   Assessment and Plan:  Pregnancy: [id number] at [redacted]w[redacted]d 1. Supervision of high risk pregnancy, antepartum Patient is doing well without complaints Cultures today Partner is planning a vasectomy - Culture, beta strep (group b only) - Cervicovaginal ancillary only( [place])  2. Genital herpes simplex, unspecified site Continue preventative treatment  3. History of IUFD Continue weekly antenatal testing until  delivery IOL scheduled at 38 weeks per patient request  Preterm labor symptoms and general obstetric precautions including but not limited to vaginal bleeding, contractions, leaking of fluid and fetal movement were reviewed in detail with the patient. Please refer to After Visit Summary for other counseling recommendations.   Return in about 1 week (around 08/29/2022) for in person, ROB, High risk.  Future Appointments  Date Time Provider [place]  08/22/2022 10:45 AM [facility] NURSE [facility]  08/22/2022 11:00 AM [facility] US1 [facility]  08/29/2022  8:55 AM Darril Patriarca, Vickii Chafe, MD [facility] None  08/29/2022  9:30 AM [facility] NURSE [facility]  08/29/2022  9:45 AM [facility] US4 [facility]  09/05/2022  8:35 AM Ziah Turvey, Vickii Chafe, MD [facility] None  09/12/2022  8:35 AM Ayeza Therriault, Vickii Chafe, MD [facility] None  09/19/2022  8:35 AM Zaahir Pickney, Vickii Chafe, MD [facility] None    Mora Bellman, MD

## 2022-08-23 ENCOUNTER — Telehealth: Payer: Self-pay | Admitting: *Deleted

## 2022-08-23 LAB — CERVICOVAGINAL ANCILLARY ONLY
Chlamydia: NEGATIVE
Comment: NEGATIVE
Comment: NORMAL
Neisseria Gonorrhea: NEGATIVE

## 2022-08-23 NOTE — Telephone Encounter (Signed)
TC to notify pt that FMLA paperwork is completed. Pt will pick up on Thurs 9/28 and render payment at that time.

## 2022-08-25 ENCOUNTER — Other Ambulatory Visit: Payer: Self-pay | Admitting: *Deleted

## 2022-08-25 NOTE — Progress Notes (Signed)
Breast pump order faxed to [employer].

## 2022-08-26 ENCOUNTER — Encounter (HOSPITAL_COMMUNITY): Payer: Self-pay

## 2022-08-26 ENCOUNTER — Telehealth (HOSPITAL_COMMUNITY): Payer: Self-pay | Admitting: *Deleted

## 2022-08-26 LAB — CULTURE, BETA STREP (GROUP B ONLY): Strep Gp B Culture: NEGATIVE

## 2022-08-26 NOTE — Telephone Encounter (Signed)
Preadmission screen  

## 2022-08-28 ENCOUNTER — Other Ambulatory Visit: Payer: Self-pay | Admitting: Advanced Practice Midwife

## 2022-08-29 ENCOUNTER — Ambulatory Visit: Payer: Medicaid Other | Attending: Obstetrics

## 2022-08-29 ENCOUNTER — Ambulatory Visit: Payer: Medicaid Other | Admitting: *Deleted

## 2022-08-29 ENCOUNTER — Other Ambulatory Visit: Payer: Self-pay | Admitting: *Deleted

## 2022-08-29 ENCOUNTER — Ambulatory Visit (INDEPENDENT_AMBULATORY_CARE_PROVIDER_SITE_OTHER): Payer: Medicaid Other | Admitting: Obstetrics and Gynecology

## 2022-08-29 ENCOUNTER — Encounter: Payer: Self-pay | Admitting: Obstetrics and Gynecology

## 2022-08-29 VITALS — BP 112/59 | HR 84

## 2022-08-29 VITALS — BP 113/73 | HR 84 | Wt 229.0 lb

## 2022-08-29 DIAGNOSIS — O99213 Obesity complicating pregnancy, third trimester: Secondary | ICD-10-CM | POA: Diagnosis not present

## 2022-08-29 DIAGNOSIS — O0990 Supervision of high risk pregnancy, unspecified, unspecified trimester: Secondary | ICD-10-CM

## 2022-08-29 DIAGNOSIS — Z3A36 36 weeks gestation of pregnancy: Secondary | ICD-10-CM

## 2022-08-29 DIAGNOSIS — O0993 Supervision of high risk pregnancy, unspecified, third trimester: Secondary | ICD-10-CM | POA: Diagnosis not present

## 2022-08-29 DIAGNOSIS — O09213 Supervision of pregnancy with history of pre-term labor, third trimester: Secondary | ICD-10-CM

## 2022-08-29 DIAGNOSIS — O09293 Supervision of pregnancy with other poor reproductive or obstetric history, third trimester: Secondary | ICD-10-CM | POA: Diagnosis not present

## 2022-08-29 DIAGNOSIS — E669 Obesity, unspecified: Secondary | ICD-10-CM

## 2022-08-29 DIAGNOSIS — Z8759 Personal history of other complications of pregnancy, childbirth and the puerperium: Secondary | ICD-10-CM

## 2022-08-29 DIAGNOSIS — O09893 Supervision of other high risk pregnancies, third trimester: Secondary | ICD-10-CM

## 2022-08-29 NOTE — Progress Notes (Signed)
ROB, induction scheduled for 09/06/22.

## 2022-08-29 NOTE — Progress Notes (Signed)
   PRENATAL VISIT NOTE  Subjective:  Adriana [name] is a 26 y.o. [id number] at [redacted]w[redacted]d being seen today for ongoing prenatal care.  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  Patient reports no complaints.  Contractions: Irritability. Vag. Bleeding: None.  Movement: Present. Denies leaking of fluid.   The following portions of the patient's history were reviewed and updated as appropriate: allergies, current medications, past family history, past medical history, past social history, past surgical history and problem list.   Objective:   Vitals:   08/29/22 0830  BP: 113/73  Pulse: 84  Weight: 229 lb (103.9 kg)    Fetal Status: Fetal Heart Rate (bpm): 158 Fundal Height: 37 cm Movement: Present     General:  Alert, oriented and cooperative. Patient is in no acute distress.  Skin: Skin is warm and dry. No rash noted.   Cardiovascular: Normal heart rate noted  Respiratory: Normal respiratory effort, no problems with respiration noted  Abdomen: Soft, gravid, appropriate for gestational age.  Pain/Pressure: Present     Pelvic: Cervical exam performed in the presence of a chaperone Dilation: Fingertip Effacement (%): Thick Station: Ballotable  Extremities: Normal range of motion.  Edema: Trace  Mental Status: Normal mood and affect. Normal behavior. Normal judgment and thought content.   Assessment and Plan:  Pregnancy: [id number] at [redacted]w[redacted]d 1. Supervision of high risk pregnancy, antepartum Patient is doing well without complaints   2. History of IUFD Ultrasound today Scheduled for IOL on 10/10 Will plan outpatient foley bulb placement on 10/9  Preterm labor symptoms and general obstetric precautions including but not limited to vaginal bleeding, contractions, leaking of fluid and fetal  movement were reviewed in detail with the patient. Please refer to After Visit Summary for other counseling recommendations.   Return in about 1 week (around 09/05/2022) for in person, ROB, High risk.  Future Appointments  Date Time Provider [place]  08/29/2022  9:30 AM [facility] NURSE [facility]  08/29/2022  9:45 AM [facility] US4 [facility]  09/05/2022  2:30 PM [place] Russey, Vickii Chafe, MD [facility] None  09/06/2022  6:30 AM [facility] [place] [facility] None    Mora Bellman, MD

## 2022-09-01 ENCOUNTER — Telehealth (HOSPITAL_COMMUNITY): Payer: Self-pay | Admitting: *Deleted

## 2022-09-01 ENCOUNTER — Encounter (HOSPITAL_COMMUNITY): Payer: Self-pay | Admitting: *Deleted

## 2022-09-01 NOTE — Telephone Encounter (Signed)
Hello Adriana [name]   I am trying to reach you to review your Medical History and answer any questions you have regarding your Induction of Labor.  My number is [phone].  I am in the office from 8:30-4:30 Monday thru Friday.    Teresa Coombs MSN [facility] Preadmission Nurse

## 2022-09-05 ENCOUNTER — Ambulatory Visit: Payer: Medicaid Other

## 2022-09-05 ENCOUNTER — Encounter: Payer: Self-pay | Admitting: Obstetrics and Gynecology

## 2022-09-05 ENCOUNTER — Encounter: Payer: Medicaid Other | Admitting: Obstetrics and Gynecology

## 2022-09-05 ENCOUNTER — Ambulatory Visit (INDEPENDENT_AMBULATORY_CARE_PROVIDER_SITE_OTHER): Payer: Medicaid Other | Admitting: Obstetrics and Gynecology

## 2022-09-05 VITALS — BP 120/82 | HR 81 | Wt 231.0 lb

## 2022-09-05 DIAGNOSIS — O0993 Supervision of high risk pregnancy, unspecified, third trimester: Secondary | ICD-10-CM | POA: Diagnosis not present

## 2022-09-05 DIAGNOSIS — O0990 Supervision of high risk pregnancy, unspecified, unspecified trimester: Secondary | ICD-10-CM

## 2022-09-05 DIAGNOSIS — Z3A37 37 weeks gestation of pregnancy: Secondary | ICD-10-CM | POA: Diagnosis not present

## 2022-09-05 DIAGNOSIS — A6000 Herpesviral infection of urogenital system, unspecified: Secondary | ICD-10-CM

## 2022-09-05 DIAGNOSIS — Z8759 Personal history of other complications of pregnancy, childbirth and the puerperium: Secondary | ICD-10-CM

## 2022-09-05 NOTE — Progress Notes (Signed)
   PRENATAL VISIT NOTE  Subjective:  Adriana [name] is a 26 y.o. [phone] at [redacted]w[redacted]d being seen today for ongoing prenatal care.  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  Patient reports no complaints.  Contractions: Irritability. Vag. Bleeding: None.  Movement: Present. Denies leaking of fluid.   The following portions of the patient's history were reviewed and updated as appropriate: allergies, current medications, past family history, past medical history, past social history, past surgical history and problem list. Problem list updated.  Objective:   Vitals:   09/05/22 1500  BP: 120/82  Pulse: 81  Weight: 231 lb (104.8 kg)    Fetal Status: Fetal Heart Rate (bpm): 148 Fundal Height: 38 cm Movement: Present     General:  Alert, oriented and cooperative. Patient is in no acute distress.  Skin: Skin is warm and dry. No rash noted.   Cardiovascular: Normal heart rate noted  Respiratory: Normal respiratory effort, no problems with respiration noted  Abdomen: Soft, gravid, appropriate for gestational age.  Pain/Pressure: Present     Pelvic: Cervical exam performed Dilation: Fingertip Effacement (%): Thick Station: Ballotable  Extremities: Normal range of motion.  Edema: Trace  Mental Status:  Normal mood and affect. Normal behavior. Normal judgment and thought content.  Procedure: Patient informed of Risks, benefits, and alternatives of procedure.   Procedure done to begin ripening of the cervix prior to admission for induction of labor. Appropriate time out taken. The patient was placed in the lithotomy position and the cervix brought into view with sterile speculum. A ring forcep was used to guide the 48F foley", Cook Catheter through the internal os of the cervix. Foley Balloon  filled with 60cc of sterile water. Plug inserted into end of the foley. Foley placed on tension and taped to medial thigh. All equipment was removed and accounted for. The patient tolerated the procedure well.   NST:  EFM Baseline: 145 bpm, Variability: Good {> 6 bpm), Accelerations: Reactive and Decelerations: Absent  Toco: none There were no signs of tachysystole or hypertonus.  Assessment and Plan:  Pregnancy: [id number] at [redacted]w[redacted]d 1. Supervision of high risk pregnancy, antepartum Patient is doing well without complaints  2. History of IUFD Scheduled for IOL tomorrow  3. Genital herpes simplex, unspecified site COntinue suppression   S/p Outpatient placement of foley balloon catheter for cervical ripening. Induction of labor scheduled for tomorrow. Reassuring FHR tracing with no concerns at present. Warning signs given to patient to include return to MAU for heavy vaginal bleeding, Rupture of membranes, painful uterine contractions q 5 mins or less, severe abdominal discomfort, decreased fetal movement.  Return in about 6 weeks (around 10/17/2022) for postpartum.   Mora Bellman, MD 09/05/2022 3:37 PM

## 2022-09-06 ENCOUNTER — Encounter (HOSPITAL_COMMUNITY): Payer: Self-pay | Admitting: Obstetrics and Gynecology

## 2022-09-06 ENCOUNTER — Inpatient Hospital Stay (HOSPITAL_COMMUNITY)
Admission: RE | Admit: 2022-09-06 | Discharge: 2022-09-06 | Disposition: A | Discharge status: Home / Self Care | Payer: Medicaid Other | Source: Ambulatory Visit | Attending: Family Medicine | Admitting: Family Medicine

## 2022-09-06 ENCOUNTER — Other Ambulatory Visit: Payer: Self-pay

## 2022-09-06 ENCOUNTER — Inpatient Hospital Stay (HOSPITAL_COMMUNITY)
Admission: RE | Admit: 2022-09-06 | Discharge: 2022-09-08 | DRG: 807 | Disposition: A | Discharge status: Home / Self Care | Payer: Medicaid Other | Attending: Obstetrics and Gynecology | Admitting: Obstetrics and Gynecology

## 2022-09-06 DIAGNOSIS — A6000 Herpesviral infection of urogenital system, unspecified: Secondary | ICD-10-CM | POA: Diagnosis present

## 2022-09-06 DIAGNOSIS — Z3490 Encounter for supervision of normal pregnancy, unspecified, unspecified trimester: Principal | ICD-10-CM | POA: Diagnosis present

## 2022-09-06 DIAGNOSIS — Z3A38 38 weeks gestation of pregnancy: Secondary | ICD-10-CM

## 2022-09-06 DIAGNOSIS — Z91040 Latex allergy status: Secondary | ICD-10-CM | POA: Diagnosis not present

## 2022-09-06 DIAGNOSIS — Z87891 Personal history of nicotine dependence: Secondary | ICD-10-CM | POA: Diagnosis not present

## 2022-09-06 DIAGNOSIS — Z8759 Personal history of other complications of pregnancy, childbirth and the puerperium: Secondary | ICD-10-CM

## 2022-09-06 DIAGNOSIS — O26893 Other specified pregnancy related conditions, third trimester: Principal | ICD-10-CM | POA: Diagnosis present

## 2022-09-06 DIAGNOSIS — O9832 Other infections with a predominantly sexual mode of transmission complicating childbirth: Secondary | ICD-10-CM | POA: Diagnosis present

## 2022-09-06 DIAGNOSIS — O0990 Supervision of high risk pregnancy, unspecified, unspecified trimester: Secondary | ICD-10-CM

## 2022-09-06 LAB — CBC
HCT: 35.1 % — ABNORMAL LOW (ref 36.0–46.0)
Hemoglobin: 11.8 g/dL — ABNORMAL LOW (ref 12.0–15.0)
MCH: 30.9 pg (ref 26.0–34.0)
MCHC: 33.6 g/dL (ref 30.0–36.0)
MCV: 91.9 fL (ref 80.0–100.0)
Platelets: 309 10*3/uL (ref 150–400)
RBC: 3.82 MIL/uL — ABNORMAL LOW (ref 3.87–5.11)
RDW: 16 % — ABNORMAL HIGH (ref 11.5–15.5)
WBC: 6.5 10*3/uL (ref 4.0–10.5)
nRBC: 0 % (ref 0.0–0.2)

## 2022-09-06 LAB — TYPE AND SCREEN
ABO/RH(D): A POS
Antibody Screen: NEGATIVE

## 2022-09-06 MED ORDER — OXYCODONE-ACETAMINOPHEN 5-325 MG PO TABS: 2.0000 | ORAL_TABLET | ORAL | Status: DC | PRN

## 2022-09-06 MED ORDER — LACTATED RINGERS IV SOLN: 500.0000 mL | INTRAVENOUS | Status: DC | PRN

## 2022-09-06 MED ORDER — ONDANSETRON HCL 4 MG/2ML IJ SOLN: 4.0000 mg | Freq: Four times a day (QID) | INTRAMUSCULAR | Status: DC | PRN

## 2022-09-06 MED ORDER — OXYTOCIN-SODIUM CHLORIDE 30-0.9 UT/500ML-% IV SOLN
1.0000 m[IU]/min | INTRAVENOUS | Status: DC
  Administered 2022-09-06: 2 m[IU]/min via INTRAVENOUS
  Filled 2022-09-06: qty 500

## 2022-09-06 MED ORDER — TERBUTALINE SULFATE 1 MG/ML IJ SOLN: 0.2500 mg | Freq: Once | INTRAMUSCULAR | Status: DC | PRN

## 2022-09-06 MED ORDER — OXYTOCIN-SODIUM CHLORIDE 30-0.9 UT/500ML-% IV SOLN: 2.5000 [IU]/h | INTRAVENOUS | Status: DC

## 2022-09-06 MED ORDER — LACTATED RINGERS IV SOLN: INTRAVENOUS | Status: DC

## 2022-09-06 MED ORDER — OXYTOCIN BOLUS FROM INFUSION
333.0000 mL | Freq: Once | INTRAVENOUS | Status: AC
  Administered 2022-09-07: 333 mL via INTRAVENOUS

## 2022-09-06 MED ORDER — SOD CITRATE-CITRIC ACID 500-334 MG/5ML PO SOLN: 30.0000 mL | ORAL | Status: DC | PRN

## 2022-09-06 MED ORDER — OXYCODONE-ACETAMINOPHEN 5-325 MG PO TABS: 1.0000 | ORAL_TABLET | ORAL | Status: DC | PRN

## 2022-09-06 MED ORDER — LIDOCAINE HCL (PF) 1 % IJ SOLN: 30.0000 mL | INTRAMUSCULAR | Status: DC | PRN

## 2022-09-06 MED ORDER — ACETAMINOPHEN 325 MG PO TABS: 650.0000 mg | ORAL_TABLET | ORAL | Status: DC | PRN

## 2022-09-06 NOTE — H&P (Signed)
Adriana [name] is a 26 y.o. female presenting for IOL for Hx IUFD. Occurred at 39 weeks. Suspected abruption. Nml fetal testing this pregnancy. Pt had desired waterbirth but explained that Hx IUFD makes this a high risk pregnancy and CNM recommends continuous fetal monitoring so pt is not a candidate.   Hx HSV seropositive. No Hx outbreaks. On Valtrex prophylaxis. No Sx per pt.   Nursing Staff Provider  Office Location  [facility] Dating   LMP  [facility] Model Arly.Keller ] Traditional [ ]  Centering [ ]  Mom-Baby Dyad    Language  English Anatomy  LVEIF, female  Flu Vaccine  Declined 3/15 Genetic/Carrier Screen  NIPS: Low risk female AFP:   Screen negative Horizon:Nml 2020  TDaP Vaccine   declined 08/29/2022 Hgb A1C or  GTT Early  Third trimester Nml  COVID Vaccine Not vaccinated   LAB RESULTS   Rhogam  NA Blood Type A/Positive/-- (04/13 1111)   Baby Feeding Plan Breast Antibody Negative (04/13 1111)  Contraception Pill/Vasectomy Rubella 4.27 (04/13 1111)  Circumcision Yes if a boy RPR Non Reactive (08/11 0923)   Pediatrician  [facility] HBsAg Negative (04/13 1111)   Support Person [name] HCVAb   neg  Prenatal Classes  HIV Non Reactive (08/11 [birth year]1983)     BTL Consent  GBS Negative/-- (09/25 0937)neg (For PCN allergy, check sensitivities)   VBAC Consent NA Pap  normal 4/23       DME Rx 07-22-1972 ] BP cuff 5/23 ] Weight Scale Waterbirth  [ ]  Class [ ]  Consent [ ]  CNM visit  PHQ9 & GAD7 Arly.Keller  ] new OB [ X ] 28 weeks  Arly.Keller ] 36 weeks Induction  [ ]  Orders Entered [ ] Foley Y/N   08/08/22: 33.6 weeks Est. 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  No hx of oral or genital outbreaks. Recommend prophylaxis in any future pregnancies.   Migraines    Past Surgical History:  Procedure Laterality Date   HERNIA REPAIR     as an infant   Family History: family history includes Healthy in her father and mother. Social History:  reports that she quit smoking about 6 years ago. Her smoking use included cigarettes. She has never used smokeless tobacco. She reports that she does not drink alcohol and does not use drugs.     Maternal Diabetes: No Genetic Screening: Normal Maternal Ultrasounds/Referrals: Isolated EIF (echogenic intracardiac focus) Fetal Ultrasounds or other Referrals:  Referred to Materal Fetal Medicine  Maternal Substance Abuse:  No Significant Maternal Medications:  None Significant Maternal Lab Results:  Group B Strep negative and Other:  Number of Prenatal Visits:greater than 3 verified prenatal visits Other Comments:   Hx  HSV seropositive. No Hx outbreaks. On Valtrex prophylaxis. No lesions seen on Spec exam. No Sx per pt.   Review of Systems  Constitutional:  Negative for chills and fever.  Eyes:  Negative for visual disturbance.  Gastrointestinal:  Negative for abdominal pain.  Genitourinary:  Negative for vaginal bleeding and vaginal discharge.  Neurological:  Negative for headaches.   Maternal Medical History:  Reason for admission: IOL for IUFD  Contractions: Frequency: rare.   Perceived severity is mild.   Fetal activity: Perceived fetal activity is normal.   Prenatal complications: No bleeding, PIH, IUGR, oligohydramnios or polyhydramnios.   Prenatal Complications - Diabetes: none.   Dilation: 5 Effacement (%): Thick Station: -3 Exam by:: Thersa Salt, RN Blood pressure 123/68, pulse 88, temperature 98.1 F (36.7 C), temperature source Oral, resp. rate 16, height 5\' 5"  (1.651 m), weight 104.8 kg, last menstrual period  12/14/2021. Maternal Exam:  Uterine Assessment: Contraction strength is mild.  Contraction frequency is rare.  Abdomen: Patient reports no abdominal tenderness. Estimated fetal weight is 7 lb.   Fetal presentation: vertex Introitus: Normal vulva. Vulva is negative for lesion.  Normal vagina.  Pelvis: adequate for delivery.   Cervix: Cervix evaluated by sterile speculum exam and digital exam.     Physical Exam Constitutional:      General: She is not in acute distress.    Appearance: She is obese. She is not ill-appearing or toxic-appearing.  Eyes:     Conjunctiva/sclera: Conjunctivae normal.  Cardiovascular:     Rate and Rhythm: Normal rate.  Pulmonary:     Effort: Pulmonary effort is normal.  Abdominal:     Palpations: Abdomen is soft.     Tenderness: There is no abdominal tenderness.  Genitourinary:    General: Normal vulva.  Vulva is no lesion.  Musculoskeletal:        General: No swelling.  Skin:    General: Skin is warm and dry.  Neurological:     Mental Status: She is alert and oriented to person, place, and time.  Psychiatric:        Mood and Affect: Mood normal.     Prenatal labs: ABO, Rh: --/--/A POS (10/10 1525) Antibody: NEG (10/10 1525) Rubella: 4.27 (04/13 1111) RPR: Non Reactive (08/11 0923)  HBsAg: Negative (04/13 1111)  HIV: Non Reactive (08/11 0923)  GBS: Negative/-- (09/25 7124)   Assessment: 1. Labor: IOL for Hx IUFD 2. Fetal Wellbeing: Category I  3. Pain Control: Comfort measures 4. GBS: neg 5. 38.0 week IUP 6. Hx HSV. No evidence of outbreak or prodrome. Was on Valtrex Prophylaxis.   Plan:  1. Admit to BS per consult with MD 2. Routine L&D orders 3. Analgesia/anesthesia PRN  4. Pitocin, then AROM PRN 5. Comfort measures, pain management discussed    Manya Silvas 09/06/2022, 6:31 PM

## 2022-09-06 NOTE — Progress Notes (Signed)
LABOR PROGRESS NOTE  Adriana [name] is a 26 y.o. [id number] at [redacted]w[redacted]d  admitted for IOL for hx of IUFD.   Subjective: Comfortable at this time. No concerns.   Objective: BP 128/63   Pulse 79   Temp 98.1 F (36.7 C) (Oral)   Resp 16   Ht 5\' 5"  (1.651 m)   Wt 104.8 kg   LMP 12/14/2021 (Exact Date)   BMI 38.44 kg/m  or  Vitals:   09/06/22 2131 09/06/22 2201 09/06/22 2231 09/06/22 2301  BP: (!) 103/57 121/69 127/76 128/63  Pulse: 79 78 84 79  Resp:      Temp:      TempSrc:      Weight:      Height:        Dilation: 5 Effacement (%): 50 Station: -2 Presentation: Vertex Exam by:: [name], rn FHT: baseline rate 135, moderate varibility, +acel, no decel Toco: every 1-2 min  Labs: Lab Results  Component Value Date   WBC 6.5 09/06/2022   HGB 11.8 (L) 09/06/2022   HCT 35.1 (L) 09/06/2022   MCV 91.9 09/06/2022   PLT 309 09/06/2022    Patient Active Problem List   Diagnosis Date Noted   Encounter for induction of labor 09/06/2022   Latex allergy 09/06/2022   Genital HSV 07/08/2022   Constipation in pregnancy 03/10/2022   Supervision of high risk pregnancy, antepartum 02/09/2022   History of IUFD 01/16/2020   Migraine headache with aura 04/16/2014    Assessment / Plan: 26 y.o. [id number] at [redacted]w[redacted]d here for IOL for hx IUFD@ 39 weeks   Labor: Progressing well. AROM at this check. Will continue to monitor.  Fetal Wellbeing:  Cat 1, continue continuous fetal monitoring  Pain Control:  Per patient  Anticipated MOD:  Vaginal   Gifford Shave, MD   09/06/2022, 11:25 PM

## 2022-09-07 ENCOUNTER — Ambulatory Visit: Payer: Medicaid Other

## 2022-09-07 DIAGNOSIS — Z3A38 38 weeks gestation of pregnancy: Secondary | ICD-10-CM

## 2022-09-07 LAB — RPR: RPR Ser Ql: NONREACTIVE

## 2022-09-07 MED ORDER — SENNOSIDES-DOCUSATE SODIUM 8.6-50 MG PO TABS
2.0000 | ORAL_TABLET | ORAL | Status: DC
  Administered 2022-09-07 – 2022-09-08 (×2): 2 via ORAL
  Filled 2022-09-07 (×2): qty 2

## 2022-09-07 MED ORDER — FENTANYL CITRATE (PF) 100 MCG/2ML IJ SOLN
50.0000 ug | INTRAMUSCULAR | Status: DC | PRN
  Administered 2022-09-07: 50 ug via INTRAVENOUS

## 2022-09-07 MED ORDER — DIPHENHYDRAMINE HCL 25 MG PO CAPS: 25.0000 mg | ORAL_CAPSULE | Freq: Four times a day (QID) | ORAL | Status: DC | PRN

## 2022-09-07 MED ORDER — DIBUCAINE (PERIANAL) 1 % EX OINT: 1.0000 | TOPICAL_OINTMENT | CUTANEOUS | Status: DC | PRN

## 2022-09-07 MED ORDER — FENTANYL CITRATE (PF) 100 MCG/2ML IJ SOLN
100.0000 ug | INTRAMUSCULAR | Status: DC | PRN
  Filled 2022-09-07: qty 2

## 2022-09-07 MED ORDER — ACETAMINOPHEN 325 MG PO TABS
650.0000 mg | ORAL_TABLET | ORAL | Status: DC | PRN
  Administered 2022-09-07: 650 mg via ORAL
  Filled 2022-09-07: qty 2

## 2022-09-07 MED ORDER — COCONUT OIL OIL: 1.0000 | TOPICAL_OIL | Status: DC | PRN

## 2022-09-07 MED ORDER — TETANUS-DIPHTH-ACELL PERTUSSIS 5-2.5-18.5 LF-MCG/0.5 IM SUSY: 0.5000 mL | PREFILLED_SYRINGE | Freq: Once | INTRAMUSCULAR | Status: DC

## 2022-09-07 MED ORDER — PRENATAL MULTIVITAMIN CH
1.0000 | ORAL_TABLET | Freq: Every day | ORAL | Status: DC
  Administered 2022-09-07 – 2022-09-08 (×2): 1 via ORAL
  Filled 2022-09-07 (×2): qty 1

## 2022-09-07 MED ORDER — ONDANSETRON HCL 4 MG PO TABS: 4.0000 mg | ORAL_TABLET | ORAL | Status: DC | PRN

## 2022-09-07 MED ORDER — WITCH HAZEL-GLYCERIN EX PADS: 1.0000 | MEDICATED_PAD | CUTANEOUS | Status: DC | PRN

## 2022-09-07 MED ORDER — ONDANSETRON HCL 4 MG/2ML IJ SOLN: 4.0000 mg | INTRAMUSCULAR | Status: DC | PRN

## 2022-09-07 MED ORDER — SIMETHICONE 80 MG PO CHEW: 80.0000 mg | CHEWABLE_TABLET | ORAL | Status: DC | PRN

## 2022-09-07 MED ORDER — IBUPROFEN 600 MG PO TABS
600.0000 mg | ORAL_TABLET | Freq: Four times a day (QID) | ORAL | Status: DC
  Administered 2022-09-07 – 2022-09-08 (×5): 600 mg via ORAL
  Filled 2022-09-07 (×6): qty 1

## 2022-09-07 MED ORDER — BENZOCAINE-MENTHOL 20-0.5 % EX AERO
1.0000 | INHALATION_SPRAY | CUTANEOUS | Status: DC | PRN
  Filled 2022-09-07 (×2): qty 56

## 2022-09-07 MED ORDER — ZOLPIDEM TARTRATE 5 MG PO TABS: 5.0000 mg | ORAL_TABLET | Freq: Every evening | ORAL | Status: DC | PRN

## 2022-09-07 NOTE — Lactation Note (Signed)
This note was copied from a baby's chart. Lactation Consultation Note  Patient Name: Adriana [name] Today's Date: 09/07/2022 Reason for consult: L&D Initial assessment;Early term 37-38.6wks Age:25 hours Mom had originally declined Lactation but then the latch was painful so mom asked for Lactation to check for tongue tie. LC unlatched baby. If baby has a tight frenulum it is probably posterior. LC unable to assess. W/gloved finger assessed suck. It is uncoordinated at this time. Explained to mom give her some time, she was just born. [place] repositioned baby. Mom stated it was better but still hurt a little bit but not as bad. Baby can move her tongue past her gums but so far not out of her mouth. Explained it is to early give baby some time to figure things out. Praised mom for doing a good job BF. Mom will be seen on [facility] today.  Maternal Data Has patient been taught Hand Expression?: Yes Does the patient have breastfeeding experience prior to this delivery?: Yes How long did the patient breastfeed?: 1 yr 1st child, 3 months 2nd child, 3rd child IUFD  Feeding    LATCH Score Latch: Grasps breast easily, tongue down, lips flanged, rhythmical sucking.  Audible Swallowing: None  Type of Nipple: Everted at rest and after stimulation  Comfort (Breast/Nipple): Soft / non-tender  Hold (Positioning): Assistance needed to correctly position infant at breast and maintain latch.  LATCH Score: 7   Lactation Tools Discussed/Used    Interventions Interventions: Adjust position;Assisted with latch;Support pillows;Skin to skin;Position options;Breast compression;Hand express  Discharge    Consult Status Consult Status: Follow-up from L&D Date: 09/07/22 Follow-up type: In-patient    Barrie Sigmund, [place]ta [place] 09/07/2022, 2:50 AM

## 2022-09-07 NOTE — Lactation Note (Signed)
This note was copied from a baby's chart. Lactation Consultation Note  Patient Name: Adriana [name] Today's Date: 09/07/2022 Age - 71 hours old  Reason for consult: Initial assessment;Early term 60-38.6wks (per Birth parent baby recently fed and hearing swallows. LC recommended and encouraged mom birth parent to call with feeding cues for latch assessment.) LC reviewed and updated the doc flow sheets per parents. HNV , HNS. BF x3  Maternal Data Birth parent reports + breast changes with pregnancy.     Feeding Mother's Current Feeding Choice: Breast Milk  LATCH Score - baby had recently fed.    Lactation Tools Discussed/Used    Interventions Interventions: Breast feeding basics reviewed;Education;LC Services brochure  Discharge Pump: DEBP;Personal  Consult Status Consult Status: Follow-up Date: 09/07/22 Follow-up type: In-patient    [place] 09/07/2022, 8:35 AM

## 2022-09-07 NOTE — Discharge Summary (Signed)
Postpartum Discharge Summary      Patient Name: Adriana [name] DOB: 25-Nov-1996 MRN: [id number]  Date of admission: 09/06/2022 Delivery date:09/07/2022  Delivering provider: [facility]  Date of discharge: 09/08/2022  Admitting diagnosis: Encounter for induction of labor [Z34.90] Intrauterine pregnancy: [redacted]w[redacted]d    Secondary diagnosis:  Principal Problem:   Encounter for induction of labor Active Problems:   History of IUFD   Supervision of high risk pregnancy, antepartum   Genital HSV   Latex allergy  Additional problems: none    Discharge diagnosis: Term Pregnancy Delivered                                              Post partum procedures: none Augmentation: AROM, Pitocin, and OP Foley Complications: None  Hospital course: Induction of Labor With Vaginal Delivery   26y.o. yo [phone] [id number] admitted to the hospital 09/06/2022 for induction of labor.  Indication for induction:  hx IUFD 2/2 abruption at 39 weeks .  Patient had an uncomplicated labor course. Membrane Rupture Time/Date: 10:07 PM ,09/06/2022   Delivery Method:Vaginal, Spontaneous  Episiotomy: None  Lacerations:  None  Details of delivery can be found in separate delivery note.  Patient had a postpartum course complicated by none. Patient is discharged home 09/08/22.  Newborn Data: Birth date:09/07/2022  Birth time:1:38 AM  Gender:Female  Living status:Living  Apgars:8 ,9  Weight:3040 g   Magnesium Sulfate received: No BMZ received: No Rhophylac:N/A MMR:N/A T-DaP:Given postpartum Flu: No Transfusion:No  Physical exam  Vitals:   09/07/22 1230 09/07/22 1630 09/07/22 1951 09/08/22 0550  BP: 118/64 113/69 (!) 127/90 122/76  Pulse: 95 74 80 69  Resp: '18 18 18 18  ' Temp: 98.1 F (36.7 C) 97.9 F (36.6 C) 97.6 F (36.4 C) 98 F (36.7 C)  TempSrc: Oral Oral Oral Oral  SpO2: 99% 99% 99% 100%  Weight:      Height:       General: alert, cooperative, and no distress Lochia:  appropriate Uterine Fundus: firm Incision: N/A DVT Evaluation: No evidence of DVT seen on physical exam. Negative Homan's sign. No cords or calf tenderness. No significant calf/ankle edema. Labs: Lab Results  Component Value Date   WBC 6.5 09/06/2022   HGB 11.8 (L) 09/06/2022   HCT 35.1 (L) 09/06/2022   MCV 91.9 09/06/2022   PLT 309 09/06/2022      Latest Ref Rng & Units 02/25/2019   11:55 AM  CMP  Glucose 65 - 99 mg/dL 82   BUN 6 - 20 mg/dL 11   Creatinine 0.57 - 1.00 mg/dL 0.92   Sodium 134 - 144 mmol/L 145   Potassium 3.5 - 5.2 mmol/L 5.1   Chloride 96 - 106 mmol/L 103   CO2 20 - 29 mmol/L 26   Calcium 8.7 - 10.2 mg/dL 9.7   Total Protein 6.0 - 8.5 g/dL 7.3   Total Bilirubin 0.0 - 1.2 mg/dL 0.3   Alkaline Phos 39 - 117 IU/L 96   AST 0 - 40 IU/L 18   ALT 0 - 32 IU/L 26    Edinburgh Score:    09/07/2022    7:52 AM  Edinburgh Postnatal Depression Scale Screening Tool  I have been able to laugh and see the funny side of things. 0  I have looked forward with enjoyment to things. 0  I have blamed myself unnecessarily when things went wrong. 0  I have been anxious or worried for no good reason. 0  I have felt scared or panicky for no good reason. 0  Things have been getting on top of me. 0  I have been so unhappy that I have had difficulty sleeping. 0  I have felt sad or miserable. 0  I have been so unhappy that I have been crying. 0  The thought of harming myself has occurred to me. 0  Edinburgh Postnatal Depression Scale Total 0     After visit meds:  Allergies as of 09/08/2022       Reactions   Imitrex [sumatriptan] Other (See Comments)   Chest pain, heaviness, migraine   Latex Rash        Medication List     TAKE these medications    acetaminophen 325 MG tablet Commonly known as: Tylenol Take 2 tablets (650 mg total) by mouth every 4 (four) hours as needed (for pain scale < 4).   coconut oil Oil Apply 1 Application topically as needed.    ferrous sulfate 325 (65 FE) MG tablet Commonly known as: FerrouSul Take 1 tablet (325 mg total) by mouth every other day.   ibuprofen 600 MG tablet Commonly known as: ADVIL Take 1 tablet (600 mg total) by mouth every 6 (six) hours.   norethindrone 0.35 MG tablet Commonly known as: Ortho Micronor Take 1 tablet (0.35 mg total) by mouth daily. Start taking on: September 25, 2022   Vitafol Gummies 3.33-0.333-34.8 MG Chew Chew 3 tablets by mouth daily.         Discharge home in stable condition Infant Feeding: Breast Infant Disposition:home with mother Discharge instruction: per After Visit Summary and Postpartum booklet. Activity: Advance as tolerated. Pelvic rest for 6 weeks.  Diet: routine diet Future Appointments: Future Appointments  Date Time Provider [place]  10/06/2022  1:10 PM Shelly Bombard, MD [place] None   Follow up Visit:  Message sent 09/07/22  Please schedule this patient for a In person postpartum visit in 4 weeks with the following provider: Any provider. Additional Postpartum F/U: None   High risk pregnancy complicated by:  Hx IUFD  Delivery mode:  Vaginal, Spontaneous  Anticipated Birth Control:  POPs   09/08/2022 Myrtis Ser, CNM 9:36 AM

## 2022-09-07 NOTE — Lactation Note (Signed)
This note was copied from a baby's chart. Lactation Consultation Note  Patient Name: Adriana [name] NOTRR'N Date: 09/07/2022 2nd [place] visit for today.  Reason for consult: Follow-up assessment;Early term 71-38.6wks (per MBU Nurse and mom mentioned the baby recently fed . per mom felt some pinching. Baby asleep. LC discussed with mom why she might be feeling pinching with latch. LC offered to how mom the reverse pressure exercise prior to latch .) [place] more compressible. LC encouraged mom to call with feeding cues for assessment to see why its pinching when latching.   Maternal Data Has patient been taught Hand Expression?: Yes  Feeding Mother's Current Feeding Choice: Breast Milk   Lactation Tools Discussed/Used    Interventions Interventions: Breast feeding basics reviewed;Reverse pressure;Education  Discharge    Consult Status Consult Status: Follow-up Date: 09/07/22 Follow-up type: In-patient    [place] 09/07/2022, 3:54 PM

## 2022-09-08 ENCOUNTER — Telehealth: Payer: Self-pay | Admitting: *Deleted

## 2022-09-08 MED ORDER — IBUPROFEN 600 MG PO TABS: 600.0000 mg | ORAL_TABLET | Freq: Four times a day (QID) | ORAL | 0 refills | Status: DC

## 2022-09-08 MED ORDER — COCONUT OIL OIL: 1.0000 | TOPICAL_OIL | 0 refills | Status: DC | PRN

## 2022-09-08 MED ORDER — NORETHINDRONE 0.35 MG PO TABS: 1.0000 | ORAL_TABLET | Freq: Every day | ORAL | 1 refills | Status: AC

## 2022-09-08 MED ORDER — ACETAMINOPHEN 325 MG PO TABS: 650.0000 mg | ORAL_TABLET | ORAL | 0 refills | Status: DC | PRN

## 2022-09-08 NOTE — Lactation Note (Signed)
This note was copied from a baby's chart. Lactation Consultation Note  Patient Name: Adriana [name] [id number]'[id number] Date: 09/08/2022 Reason for consult: Follow-up assessment;Early term 37-38.6wks;Infant weight loss Age:25 hours As LC entered the room baby latched with depth. Per mom having some pinching with latch. LC checked the latch , lips flanged. LC recommended using support with the cross cradle and the football position.  Prior to latching - breast massage, hand express, pre-pump with hand pump if needed due to milk coming and reverse pressure.  LC reviewed BF D/C teaching and the [facility] resources after D/C .  Shells provided for between feedings except when sleeping.  Baby ended up feeding 30 mins.   Maternal Data Has patient been taught Hand Expression?: Yes  Feeding Mother's Current Feeding Choice: Breast Milk and Formula  LATCH Score Latch:  (baby already latched, with depth, no pillow support. per mom some pinching. LC flipped the lip to increase flange lips and ease down the chin.)  Audible Swallowing:  (multiple swallows)  Type of Nipple:  (nipple well rounded)  Comfort (Breast/Nipple):  (per mom some pinching)  Hold (Positioning):  (per mom latched the baby and dad assisted)      Lactation Tools Discussed/Used    Interventions Interventions: Breast feeding basics reviewed;Breast compression;Shells;Hand pump;DEBP;Education;LC Services brochure  Discharge Discharge Education: Engorgement and breast care;Warning signs for feeding baby Pump: Personal;DEBP  Consult Status Consult Status: Complete Date: 09/08/22    Myer Haff 09/08/2022, 12:45 PM

## 2022-09-08 NOTE — Telephone Encounter (Signed)
Received request from [employer] for order for incontinence supplies/ adult pull on underwear. TC to pt for further discussion as no hx of incontinence noted in chart. Pt reports she does not need the supplies. [employer] notified.

## 2022-09-12 ENCOUNTER — Encounter: Payer: Medicaid Other | Admitting: Obstetrics and Gynecology

## 2022-09-17 ENCOUNTER — Telehealth (HOSPITAL_COMMUNITY): Payer: Self-pay

## 2022-09-17 NOTE — Telephone Encounter (Signed)
Patient did not answer phone call. Voicemail left for patient.   Sharyn Lull [facility] 09/17/2022,0930

## 2022-09-19 ENCOUNTER — Encounter: Payer: Medicaid Other | Admitting: Obstetrics and Gynecology

## 2022-10-06 ENCOUNTER — Other Ambulatory Visit (HOSPITAL_COMMUNITY)
Admission: RE | Admit: 2022-10-06 | Discharge: 2022-10-06 | Disposition: A | Discharge status: Home / Self Care | Payer: Medicaid Other | Source: Ambulatory Visit | Attending: Obstetrics | Admitting: Obstetrics

## 2022-10-06 ENCOUNTER — Ambulatory Visit (INDEPENDENT_AMBULATORY_CARE_PROVIDER_SITE_OTHER): Payer: Medicaid Other | Admitting: Obstetrics

## 2022-10-06 ENCOUNTER — Encounter: Payer: Self-pay | Admitting: Obstetrics

## 2022-10-06 DIAGNOSIS — R519 Headache, unspecified: Secondary | ICD-10-CM | POA: Diagnosis not present

## 2022-10-06 DIAGNOSIS — N898 Other specified noninflammatory disorders of vagina: Secondary | ICD-10-CM | POA: Diagnosis present

## 2022-10-06 DIAGNOSIS — E669 Obesity, unspecified: Secondary | ICD-10-CM

## 2022-10-06 DIAGNOSIS — R030 Elevated blood-pressure reading, without diagnosis of hypertension: Secondary | ICD-10-CM | POA: Diagnosis not present

## 2022-10-06 DIAGNOSIS — Z3009 Encounter for other general counseling and advice on contraception: Secondary | ICD-10-CM

## 2022-10-06 MED ORDER — NIFEDIPINE ER OSMOTIC RELEASE 30 MG PO TB24: 30.0000 mg | ORAL_TABLET | Freq: Every day | ORAL | 1 refills | Status: DC

## 2022-10-06 NOTE — Addendum Note (Signed)
Addended by: Dalphine Handing on: 10/06/2022 04:44 PM   Modules accepted: Orders

## 2022-10-06 NOTE — Progress Notes (Signed)
Post Partum Visit Note  Adriana [name] is a 26 y.o. [phone] female who presents for a postpartum visit. She is 4 weeks postpartum following a normal spontaneous vaginal delivery.  I have fully reviewed the prenatal and intrapartum course. The delivery was at [redacted]w[redacted]d gestational weeks.  Anesthesia: none. Postpartum course has been complicated due to headaches with visual changes. Baby is doing well. Baby is feeding by breast. Bleeding no bleeding. Bowel function is normal. Bladder function is normal. Patient is not sexually active. Contraception method is none. Postpartum depression screening: negative.   The pregnancy intention screening data noted above was reviewed. Potential methods of contraception were discussed. The patient elected to proceed with No data recorded.   Edinburgh Postnatal Depression Scale - 10/06/22 1331       Edinburgh Postnatal Depression Scale:  In the Past 7 Days   I have been able to laugh and see the funny side of things. 0    I have looked forward with enjoyment to things. 0    I have blamed myself unnecessarily when things went wrong. 0    I have been anxious or worried for no good reason. 0    I have felt scared or panicky for no good reason. 0    Things have been getting on top of me. 0    I have been so unhappy that I have had difficulty sleeping. 0    I have felt sad or miserable. 0    I have been so unhappy that I have been crying. 0    The thought of harming myself has occurred to me. 0    Edinburgh Postnatal Depression Scale Total 0             Health Maintenance Due  Topic Date Due   COVID-19 Vaccine (1) Never done   INFLUENZA VACCINE  06/28/2022    The following portions of the patient's history were reviewed and updated as appropriate: allergies, current medications, past family history, past medical history, past social history, past surgical history, and problem list.  Review of Systems A comprehensive review of systems was  negative.  Objective:  BP (!) 134/93   Pulse 64   Wt 207 lb 8 oz (94.1 kg)   LMP 12/14/2021 (Exact Date)   Breastfeeding Unknown   BMI 34.53 kg/m    General:  alert and no distress   Breasts:  normal  Lungs: clear to auscultation bilaterally  Heart:  regular rate and rhythm, S1, S2 normal, no murmur, click, rub or gallop  Abdomen: soft, non-tender; bowel sounds normal; no masses,  no organomegaly   Wound none  GU exam:  not indicated       Assessment:    1. Postpartum care following vaginal delivery  2. New onset of headaches - accompanied by visual changes  3. Elevated blood-pressure reading, without diagnosis of hypertension. - R/O pre-eclampsia Rx: - NIFEdipine (PROCARDIA XL) 30 MG 24 hr tablet; Take 1 tablet (30 mg total) by mouth daily.  Dispense: 30 tablet; Refill: 1 - Lactate dehydrogenase - Creatinine, serum - CBC - ALT - AST - Protein / creatinine ratio, urine  4. Encounter for other general counseling and advice on contraception - breast feeding - taking progestin-only contraception  5. Obesity (BMI 30.0-34.9) - weight reduction recommended    Plan:   Essential components of care per ACOG recommendations:  1.  Mood and well being: Patient with negative depression screening today. Reviewed local resources for support.  -  Patient tobacco use? No.   - hx of drug use? No.    2. Infant care and feeding:  -Patient currently breastmilk feeding? Yes. Discussed returning to work and pumping. Reviewed importance of draining breast regularly to support lactation.  -Social determinants of health (SDOH) reviewed in EPIC.   3. Sexuality, contraception and birth spacing - Patient does not want a pregnancy in the next year.  Desired family size is 3 children.  - Reviewed reproductive life planning. Reviewed contraceptive methods based on pt preferences and effectiveness.  Patient desired Oral Contraceptive today.   - Discussed birth spacing of 18 months  4.  Sleep and fatigue -Encouraged family/partner/community support of 4 hrs of uninterrupted sleep to help with mood and fatigue  5. Physical Recovery  - Discussed patients delivery and complications. She describes her labor as good. - Patient had a Vaginal, no problems at delivery. Patient had  no  lacerations.  - Patient has urinary incontinence? No. - Patient is safe to resume physical and sexual activity  6.  Health Maintenance - HM due items addressed Yes - Last pap smear  Diagnosis  Date Value Ref Range Status  03/10/2022   Final   - Negative for intraepithelial lesion or malignancy (NILM)   Pap smear not done at today's visit.  -Breast Cancer screening indicated? No.   7. Chronic Disease/Pregnancy Condition follow up: None   Coral Ceo, MD [facility], [facility], [facility] 10/06/22

## 2022-10-07 ENCOUNTER — Other Ambulatory Visit: Payer: Self-pay | Admitting: *Deleted

## 2022-10-07 LAB — LACTATE DEHYDROGENASE: LDH: 172 IU/L (ref 119–226)

## 2022-10-07 LAB — CREATININE, SERUM
Creatinine, Ser: 0.87 mg/dL (ref 0.57–1.00)
eGFR: 95 mL/min/{1.73_m2} (ref >59)

## 2022-10-07 LAB — CBC
Hematocrit: 37.8 % (ref 34.0–46.6)
Hemoglobin: 12.3 g/dL (ref 11.1–15.9)
MCH: 30 pg (ref 26.6–33.0)
MCHC: 32.5 g/dL (ref 31.5–35.7)
MCV: 92 fL (ref 79–97)
Platelets: 308 10*3/uL (ref 150–450)
RBC: 4.1 x10E6/uL (ref 3.77–5.28)
RDW: 15.5 % — ABNORMAL HIGH (ref 11.7–15.4)
WBC: 4.9 10*3/uL (ref 3.4–10.8)

## 2022-10-07 LAB — AST: AST: 41 IU/L — ABNORMAL HIGH (ref 0–40)

## 2022-10-07 LAB — ALT: ALT: 69 IU/L — ABNORMAL HIGH (ref 0–32)

## 2022-10-07 LAB — PROTEIN / CREATININE RATIO, URINE
Creatinine, Urine: 121.2 mg/dL
Protein, Ur: 9.6 mg/dL
Protein/Creat Ratio: 79 mg/g creat (ref 0–200)

## 2022-10-07 NOTE — Progress Notes (Signed)
Pt requested 90 day RX Procardia via pharmacy. Denied. RX was for total of 60 days with reevaluation/ postpartum visit scheduled 11/16/22. Need for RX to be reevaluated at that time.

## 2022-10-10 LAB — CERVICOVAGINAL ANCILLARY ONLY
Bacterial Vaginitis (gardnerella): POSITIVE — AB
Candida Glabrata: NEGATIVE
Candida Vaginitis: NEGATIVE
Chlamydia: NEGATIVE
Comment: NEGATIVE
Comment: NEGATIVE
Comment: NEGATIVE
Comment: NEGATIVE
Comment: NEGATIVE
Comment: NORMAL
Neisseria Gonorrhea: NEGATIVE
Trichomonas: NEGATIVE

## 2022-10-11 ENCOUNTER — Other Ambulatory Visit: Payer: Self-pay | Admitting: Obstetrics

## 2022-10-11 DIAGNOSIS — B9689 Other specified bacterial agents as the cause of diseases classified elsewhere: Secondary | ICD-10-CM

## 2022-10-11 MED ORDER — METRONIDAZOLE 500 MG PO TABS: 500.0000 mg | ORAL_TABLET | Freq: Two times a day (BID) | ORAL | 2 refills | Status: DC

## 2022-10-11 NOTE — Progress Notes (Signed)
TC to pt to notify of BV and RX Flagyl. Pt is aware. Has concerns with recent CMP results and is uncertain if she was to continue Procardia. Msg sent to Dr. Clearance Coots.

## 2022-10-18 ENCOUNTER — Ambulatory Visit: Payer: Medicaid Other | Admitting: Obstetrics

## 2022-11-16 ENCOUNTER — Ambulatory Visit: Payer: Medicaid Other | Admitting: Obstetrics

## 2022-12-20 ENCOUNTER — Other Ambulatory Visit (HOSPITAL_COMMUNITY)
Admission: RE | Admit: 2022-12-20 | Discharge: 2022-12-20 | Disposition: A | Discharge status: Home / Self Care | Payer: Medicaid Other | Source: Ambulatory Visit | Attending: Obstetrics | Admitting: Obstetrics

## 2022-12-20 ENCOUNTER — Ambulatory Visit (INDEPENDENT_AMBULATORY_CARE_PROVIDER_SITE_OTHER): Payer: Medicaid Other | Admitting: General Practice

## 2022-12-20 ENCOUNTER — Ambulatory Visit: Payer: Medicaid Other

## 2022-12-20 VITALS — BP 126/84 | HR 63 | Ht 65.0 in | Wt 196.3 lb

## 2022-12-20 DIAGNOSIS — N898 Other specified noninflammatory disorders of vagina: Secondary | ICD-10-CM

## 2022-12-20 NOTE — Progress Notes (Signed)
SUBJECTIVE:  27 y.o. female complains of clear, white, and thick vaginal discharge for 1 week(s). Denies abnormal vaginal bleeding or significant pelvic pain or fever. No UTI symptoms. Denies history of known exposure to STD.  No LMP recorded.  OBJECTIVE:  She appears well, afebrile. Urine dipstick: not done.  ASSESSMENT:  Vaginal Discharge  Vaginal Odor   PLAN:  GC, chlamydia, trichomonas, BVAG, CVAG probe sent to lab. Treatment: To be determined once lab results are received ROV prn if symptoms persist or worsen.  Pt encouraged to follow up for elevated blood pressure.

## 2022-12-22 LAB — CERVICOVAGINAL ANCILLARY ONLY
Bacterial Vaginitis (gardnerella): POSITIVE — AB
Chlamydia: POSITIVE — AB
Comment: NEGATIVE
Comment: NEGATIVE
Comment: NEGATIVE
Comment: NORMAL
Neisseria Gonorrhea: NEGATIVE
Trichomonas: NEGATIVE

## 2022-12-23 ENCOUNTER — Other Ambulatory Visit: Payer: Self-pay

## 2022-12-23 ENCOUNTER — Telehealth: Payer: Self-pay

## 2022-12-23 DIAGNOSIS — B9689 Other specified bacterial agents as the cause of diseases classified elsewhere: Secondary | ICD-10-CM

## 2022-12-23 DIAGNOSIS — A749 Chlamydial infection, unspecified: Secondary | ICD-10-CM

## 2022-12-23 MED ORDER — METRONIDAZOLE 0.75 % VA GEL: 1.0000 | Freq: Every day | VAGINAL | 1 refills | Status: DC

## 2022-12-23 MED ORDER — DOXYCYCLINE HYCLATE 100 MG PO CAPS: 100.0000 mg | ORAL_CAPSULE | Freq: Two times a day (BID) | ORAL | 0 refills | Status: DC

## 2022-12-23 NOTE — Telephone Encounter (Signed)
Pt is aware of results, meds sent to pharmacy, form faxed to HD

## 2023-06-23 ENCOUNTER — Ambulatory Visit
Admission: EM | Admit: 2023-06-23 | Discharge: 2023-06-23 | Disposition: A | Discharge status: Home / Self Care | Payer: Medicaid Other | Source: Home / Self Care

## 2023-06-23 DIAGNOSIS — N760 Acute vaginitis: Secondary | ICD-10-CM | POA: Diagnosis not present

## 2023-06-23 MED ORDER — METRONIDAZOLE 500 MG PO TABS: 500.0000 mg | ORAL_TABLET | Freq: Two times a day (BID) | ORAL | 0 refills | Status: AC

## 2023-06-23 NOTE — Discharge Instructions (Signed)
Return if any problems.

## 2023-06-23 NOTE — ED Provider Notes (Signed)
[facility]    CSN: [id number] Arrival date & time: 06/23/23  1234      History   Chief Complaint Chief Complaint  Patient presents with   SEXUALLY TRANSMITTED DISEASE    HPI Adriana [name] is a 27 y.o. female.   Pt complains of a vaginal odor.  Pt reports she has had the same in the past and been diagnosed with BV.  Pt denies std risk.  No fever  no abdominal pain   The history is provided by the patient. No language interpreter was used.    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  No hx of oral or genital outbreaks. Recommend prophylaxis in any future pregnancies.   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 Authorizing Provider  metroNIDAZOLE (FLAGYL) 500 MG tablet Take 1 tablet (500 mg total) by mouth 2 (two) times daily for 7 days. 06/23/23 06/30/23 Yes Elson Areas, PA-C  acetaminophen (TYLENOL) 325 MG tablet Take 2 tablets (650 mg total) by mouth every 4 (four) hours as needed (for pain scale < 4). Patient not taking: Reported on 10/06/2022 09/08/22   Margie Billet, MD   coconut oil OIL Apply 1 Application topically as needed. Patient not taking: Reported on 10/06/2022 09/08/22   Margie Billet, MD  doxycycline (VIBRAMYCIN) 100 MG capsule Take 1 capsule (100 mg total) by mouth 2 (two) times daily. 12/23/22   Adam Phenix, MD  ferrous sulfate (FERROUSUL) 325 (65 FE) MG tablet Take 1 tablet (325 mg total) by mouth every other day. Patient not taking: Reported on 08/22/2022 07/09/22   Milas Hock, MD  ibuprofen (ADVIL) 600 MG tablet Take 1 tablet (600 mg total) by mouth every 6 (six) hours. Patient not taking: Reported on 10/06/2022 09/08/22   Margie Billet, MD  metroNIDAZOLE (METROGEL) 0.75 % vaginal gel Place 1 Applicatorful vaginally at bedtime. Apply one applicatorful to vagina at bedtime for 5 days 12/23/22   Adam Phenix, MD  NIFEdipine (PROCARDIA XL) 30 MG 24 hr tablet Take 1 tablet (30 mg total) by mouth daily. 10/06/22   Brock Bad, MD  norethindrone (ORTHO MICRONOR) 0.35 MG tablet Take 1 tablet (0.35 mg total) by mouth daily. 09/25/22 09/25/23  Margie Billet, MD  Prenatal Vit-Fe Phos-FA-Omega (VITAFOL  GUMMIES) 3.33-0.333-34.8 MG CHEW Chew 3 tablets by mouth daily. 02/09/22   Warden Fillers, MD    Family History Family History  Problem Relation Age of Onset   Healthy Mother    Healthy Father     Social History Social History   Tobacco Use   Smoking status: Former    Current packs/day: 0.00    Types: Cigarettes    Quit date: 06/30/2016    Years since quitting: 6.9   Smokeless tobacco: Never  Vaping Use   Vaping status: Never Used  Substance Use Topics   Alcohol use: No    Alcohol/week: 0.0 standard drinks of alcohol   Drug use: No     Allergies   Imitrex [sumatriptan] and Latex   Review of Systems Review of Systems  All other systems reviewed and are negative.    Physical Exam Triage Vital Signs ED Triage Vitals  Encounter Vitals Group     BP 06/23/23 1255 125/80     Systolic BP Percentile --       Diastolic BP Percentile --      Pulse Rate 06/23/23 1255 78     Resp 06/23/23 1255 16     Temp 06/23/23 1255 97.9 F (36.6 C)     Temp Source 06/23/23 1255 Oral     SpO2 06/23/23 1255 97 %     Weight --      Height --      Head Circumference --      Peak Flow --      Pain Score 06/23/23 1257 0     Pain Loc --      Pain Education --      Exclude from Growth Chart --    No data found.  Updated Vital Signs BP 125/80 (BP Location: Right Arm)   Pulse 78   Temp 97.9 F (36.6 C) (Oral)   Resp 16   LMP 06/11/2023 (Approximate)   SpO2 97%   Breastfeeding No   Visual Acuity Right Eye Distance:   Left Eye Distance:   Bilateral Distance:    Right Eye Near:   Left Eye Near:    Bilateral Near:     Physical Exam Vitals reviewed.  Constitutional:      Appearance: Normal appearance.  Cardiovascular:     Rate and Rhythm: Normal rate.  Pulmonary:     Effort: Pulmonary effort is normal.  Musculoskeletal:        General: Normal range of motion.  Skin:    General: Skin is warm.  Neurological:     General: No focal deficit present.     Mental Status: She is alert.  Psychiatric:        Mood and Affect: Mood normal.      UC Treatments / Results  Labs (all labs ordered are listed, but only abnormal results are displayed) Labs Reviewed  CERVICOVAGINAL ANCILLARY ONLY    EKG   Radiology No results found.  Procedures Procedures (including critical care time)  Medications Ordered in UC Medications - No data to display  Initial Impression / Assessment and Plan / UC Course  I have reviewed the triage vital signs and the nursing notes.  Pertinent labs & imaging results that were available during my care of the patient were reviewed by me and considered in my medical decision making (see chart for details).      Final Clinical Impressions(s) / UC Diagnoses   Final diagnoses:  Vaginitis and vulvovaginitis  Discharge Instructions      Return if any  problems.    ED Prescriptions     Medication Sig Dispense Auth. Provider   metroNIDAZOLE (FLAGYL) 500 MG tablet Take 1 tablet (500 mg total) by mouth 2 (two) times daily for 7 days. 14 tablet Elson Areas, [place]      PDMP not reviewed this encounter. An After Visit Summary was printed and given to the patient.        Elson Areas, [place] 06/23/23 1346

## 2023-06-23 NOTE — ED Triage Notes (Signed)
Pt states foul smell from her vagina for the past week. States she would like to be tested for STD's.

## 2023-07-05 ENCOUNTER — Telehealth: Payer: Self-pay | Admitting: Physician Assistant

## 2023-07-05 MED ORDER — FLUCONAZOLE 150 MG PO TABS: ORAL_TABLET | ORAL | 0 refills | Status: DC

## 2023-07-05 NOTE — Telephone Encounter (Signed)
Diflucan prescribed for suspected yeast infection as requested

## 2023-09-17 ENCOUNTER — Emergency Department (HOSPITAL_COMMUNITY): Payer: Medicaid Other

## 2023-09-17 ENCOUNTER — Other Ambulatory Visit: Payer: Self-pay

## 2023-09-17 ENCOUNTER — Emergency Department (HOSPITAL_COMMUNITY)
Admission: EM | Admit: 2023-09-17 | Discharge: 2023-09-18 | Disposition: A | Discharge status: Home / Self Care | Payer: Medicaid Other | Attending: Emergency Medicine | Admitting: Emergency Medicine

## 2023-09-17 ENCOUNTER — Encounter (HOSPITAL_COMMUNITY): Payer: Self-pay

## 2023-09-17 DIAGNOSIS — Z91040 Latex allergy status: Secondary | ICD-10-CM | POA: Diagnosis not present

## 2023-09-17 DIAGNOSIS — R002 Palpitations: Secondary | ICD-10-CM | POA: Insufficient documentation

## 2023-09-17 DIAGNOSIS — R55 Syncope and collapse: Secondary | ICD-10-CM | POA: Insufficient documentation

## 2023-09-17 LAB — CBC
HCT: 37.8 % (ref 36.0–46.0)
Hemoglobin: 12.4 g/dL (ref 12.0–15.0)
MCH: 32.9 pg (ref 26.0–34.0)
MCHC: 32.8 g/dL (ref 30.0–36.0)
MCV: 100.3 fL — ABNORMAL HIGH (ref 80.0–100.0)
Platelets: 297 10*3/uL (ref 150–400)
RBC: 3.77 MIL/uL — ABNORMAL LOW (ref 3.87–5.11)
RDW: 12.9 % (ref 11.5–15.5)
WBC: 5.8 10*3/uL (ref 4.0–10.5)
nRBC: 0 % (ref 0.0–0.2)

## 2023-09-17 LAB — URINALYSIS, ROUTINE W REFLEX MICROSCOPIC
Bilirubin Urine: NEGATIVE
Glucose, UA: NEGATIVE mg/dL
Hgb urine dipstick: NEGATIVE
Ketones, ur: NEGATIVE mg/dL
Leukocytes,Ua: NEGATIVE
Nitrite: NEGATIVE
Protein, ur: NEGATIVE mg/dL
Specific Gravity, Urine: 1.023 (ref 1.005–1.030)
pH: 7 (ref 5.0–8.0)

## 2023-09-17 LAB — BASIC METABOLIC PANEL
Anion gap: 10 (ref 5–15)
BUN: 11 mg/dL (ref 6–20)
CO2: 23 mmol/L (ref 22–32)
Calcium: 9.5 mg/dL (ref 8.9–10.3)
Chloride: 105 mmol/L (ref 98–111)
Creatinine, Ser: 0.7 mg/dL (ref 0.44–1.00)
GFR, Estimated: >60 mL/min (ref >60)
Glucose, Bld: 96 mg/dL (ref 70–99)
Potassium: 4.2 mmol/L (ref 3.5–5.1)
Sodium: 138 mmol/L (ref 135–145)

## 2023-09-17 LAB — HEPATIC FUNCTION PANEL
ALT: 16 U/L (ref 0–44)
AST: 18 U/L (ref 15–41)
Albumin: 4 g/dL (ref 3.5–5.0)
Alkaline Phosphatase: 43 U/L (ref 38–126)
Bilirubin, Direct: <0.1 mg/dL (ref 0.0–0.2)
Total Bilirubin: 0.5 mg/dL (ref 0.3–1.2)
Total Protein: 7.2 g/dL (ref 6.5–8.1)

## 2023-09-17 LAB — HCG, SERUM, QUALITATIVE: Preg, Serum: NEGATIVE

## 2023-09-17 LAB — LIPASE, BLOOD: Lipase: 24 U/L (ref 11–51)

## 2023-09-17 MED ORDER — DIPHENHYDRAMINE HCL 50 MG/ML IJ SOLN
12.5000 mg | Freq: Once | INTRAMUSCULAR | Status: AC
  Administered 2023-09-17: 12.5 mg via INTRAVENOUS
  Filled 2023-09-17: qty 1

## 2023-09-17 MED ORDER — PROCHLORPERAZINE EDISYLATE 10 MG/2ML IJ SOLN
10.0000 mg | Freq: Once | INTRAMUSCULAR | Status: AC
  Administered 2023-09-17: 10 mg via INTRAVENOUS
  Filled 2023-09-17: qty 2

## 2023-09-17 NOTE — ED Provider Notes (Signed)
  Provider Note MRN:  [id number]  Arrival date & time: 09/17/23    ED Course and Medical Decision Making  Assumed care from Dr. Lockie Mola at shift change.  Recurrent syncope, awaiting CT head.  Anticipating dc with fu.  Procedures  Final Clinical Impressions(s) / ED Diagnoses     ICD-10-CM   1. Syncope and collapse  R55 Ambulatory referral to Cardiology    Ambulatory referral to Neurology    2. Palpitations  R00.2 Ambulatory referral to Cardiology      ED Discharge Orders          Ordered    Ambulatory referral to Cardiology       Comments: If you have not heard from the Cardiology office within the next 72 hours please call [phone].   09/17/23 2236    Ambulatory referral to Neurology       Comments: An appointment is requested in approximately: 1 week   09/17/23 2236              Discharge Instructions      I have placed referral to neurology and cardiology for you.  Recommend 1000 mg of Tylenol every 6 hours as needed for headaches.  Recommend 800 mg ibuprofen every 8 hours as needed for headaches but do not exceed this for more than 2 to 3 days in a row.  I recommend discontinuing Goody powders.  As we discussed I request that you do not drive until you are seen by neurology.  Please avoid any dangerous activities as we discussed.  Follow-up with your primary care doctor.  Please return if symptoms worsen.  Workup today is unremarkable.    Elmer Sow. Pilar Plate, MD [facility] [email or web address]    Sabas Sous, MD 09/17/23 2308

## 2023-09-17 NOTE — ED Triage Notes (Signed)
Pt also states she has been having HA's x 1 wk, and taking lots of BC powders for pain. Reporting some abdominal discomfort

## 2023-09-17 NOTE — ED Notes (Signed)
Patient transported to CT 

## 2023-09-17 NOTE — ED Provider Notes (Signed)
[place] EMERGENCY DEPARTMENT AT [facility] Provider Note   CSN: [id number] Arrival date & time: 09/17/23  1747     History  Chief Complaint  Patient presents with   Loss of Consciousness    Adriana [name] is a 27 y.o. female.  Patient here after syncopal episode.  She has been have a lot of prodrome symptoms in the past where she feels like she is going to pass out but has not.  She had an episode recently where she did pass out and then again today.  She will feel palpitations hot and sweaty before this episode.  But did not sound like she had any confusion or incontinence or shaking or seizure activity.  She has a history of migraines and migraines have been particularly worse his last few days.  She has been taking BC powders without much relief.  She has been under a good amount of stress.  She denies any suicidal thoughts or pression but does admit to anxiety.  She denies any chest pain shortness of breath.  No recent surgery or travel.  Is not on any birth control.  Denies any recent illness.  Denies any weakness numbness tingling.  She still has mild headache now.  Has not follow-up with neurologist in the past for her migraines was supposed to but never did.  The history is provided by the patient.       Home Medications Prior to Admission medications   Medication Sig Start Date End Date Taking? Authorizing Provider  acetaminophen (TYLENOL) 325 MG tablet Take 2 tablets (650 mg total) by mouth every 4 (four) hours as needed (for pain scale < 4). Patient not taking: Reported on 10/06/2022 09/08/22   Margie Billet, MD  coconut oil OIL Apply 1 Application topically as needed. Patient not taking: Reported on 10/06/2022 09/08/22   Margie Billet, MD  doxycycline (VIBRAMYCIN) 100 MG capsule Take 1 capsule (100 mg total) by mouth 2 (two) times daily. 12/23/22   Costantino Kohlbeck Phenix, MD  ferrous sulfate (FERROUSUL) 325 (65 FE) MG tablet Take 1 tablet (325 mg total)  by mouth every other day. Patient not taking: Reported on 08/22/2022 07/09/22   Milas Hock, MD  fluconazole (DIFLUCAN) 150 MG tablet Take one tab PO today and repeat dose in 3 days if symptoms persist 07/05/23   Tomi Bamberger, PA-C  ibuprofen (ADVIL) 600 MG tablet Take 1 tablet (600 mg total) by mouth every 6 (six) hours. Patient not taking: Reported on 10/06/2022 09/08/22   Margie Billet, MD  metroNIDAZOLE (METROGEL) 0.75 % vaginal gel Place 1 Applicatorful vaginally at bedtime. Apply one applicatorful to vagina at bedtime for 5 days 12/23/22   Aodhan Scheidt Phenix, MD  NIFEdipine (PROCARDIA XL) 30 MG 24 hr tablet Take 1 tablet (30 mg total) by mouth daily. 10/06/22   Brock Bad, MD  norethindrone (ORTHO MICRONOR) 0.35 MG tablet Take 1 tablet (0.35 mg total) by mouth daily. 09/25/22 09/25/23  Margie Billet, MD  Prenatal Vit-Fe Phos-FA-Omega (VITAFOL GUMMIES) 3.33-0.333-34.8 MG CHEW Chew 3 tablets by mouth daily. 02/09/22   Warden Fillers, MD      Allergies    Imitrex [sumatriptan] and Latex    Review of Systems   Review of Systems  Physical Exam Updated Vital Signs BP (!) 136/98   Pulse 80   Temp 98.4 F (36.9 C)   Resp 14   Ht 5\' 5"  (1.651 m)   Wt 88.9 kg   SpO2 100%  BMI 32.62 kg/m  Physical Exam Vitals and nursing note reviewed.  Constitutional:      General: She is not in acute distress.    Appearance: She is well-developed. She is not ill-appearing.  HENT:     Head: Normocephalic and atraumatic.     Nose: Nose normal.     Mouth/Throat:     Mouth: Mucous membranes are moist.  Eyes:     Extraocular Movements: Extraocular movements intact.     Conjunctiva/sclera: Conjunctivae normal.     Pupils: Pupils are equal, round, and reactive to light.  Cardiovascular:     Rate and Rhythm: Normal rate and regular rhythm.     Pulses: Normal pulses.     Heart sounds: Normal heart sounds. No murmur heard. Pulmonary:     Effort: Pulmonary effort is normal. No  respiratory distress.     Breath sounds: Normal breath sounds.  Abdominal:     Palpations: Abdomen is soft.     Tenderness: There is no abdominal tenderness.  Musculoskeletal:        General: No swelling.     Cervical back: Normal range of motion and neck supple.  Skin:    General: Skin is warm and dry.     Capillary Refill: Capillary refill takes less than 2 seconds.  Neurological:     General: No focal deficit present.     Mental Status: She is alert and oriented to person, place, and time.     Cranial Nerves: No cranial nerve deficit.     Sensory: No sensory deficit.     Motor: No weakness.     Coordination: Coordination normal.     Comments: 5+ out of 5 strength all, normal sensation, no drift, normal speech, normal finger-nose-finger, normal visual fields  Psychiatric:        Mood and Affect: Mood normal.     ED Results / Procedures / Treatments   Labs (all labs ordered are listed, but only abnormal results are displayed) Labs Reviewed  CBC - Abnormal; Notable for the following components:      Result Value   RBC 3.77 (*)    MCV 100.3 (*)    All other components within normal limits  URINALYSIS, ROUTINE W REFLEX MICROSCOPIC - Abnormal; Notable for the following components:   APPearance HAZY (*)    All other components within normal limits  BASIC METABOLIC PANEL  HCG, SERUM, QUALITATIVE  HEPATIC FUNCTION PANEL  LIPASE, BLOOD    EKG EKG Interpretation Date/Time:  Sunday September 17 2023 19:22:57 EDT Ventricular Rate:  76 PR Interval:  150 QRS Duration:  80 QT Interval:  368 QTC Calculation: 414 R Axis:   33  Text Interpretation: Normal sinus rhythm with sinus arrhythmia Normal ECG No previous ECGs available Confirmed by Virgina Norfolk [phone]) on 09/17/2023 9:10:55 PM  Radiology No results found.  Procedures Procedures    Medications Ordered in ED Medications  prochlorperazine (COMPAZINE) injection 10 mg (10 mg Intravenous Given 09/17/23 2157)   diphenhydrAMINE (BENADRYL) injection 12.5 mg (12.5 mg Intravenous Given 09/17/23 2156)    ED Course/ Medical Decision Making/ A&P                                 Medical Decision Making Amount and/or Complexity of Data Reviewed Labs: ordered. Radiology: ordered.  Risk Prescription drug management.   Adriana [name] is here with syncope as well as some migraines.  Normal vitals.  No fever.  Patient neurologically intact.  Well-appearing.  History of migraines.  She has had maybe now 2 episodes of passing out after feeling lightheaded palpitations warmth.  Seems like she has had near syncope symptoms for a long time but now she has had some episodes where she is lost consciousness.  Does not sound like she had seizure activities or postictal state or incontinence when talking with her.  Ultimately differential is that these are likely vasovagal processes or stress related process.  She does admit to a fair amount of anxiety.  She has been dealing with migraines for a long time as well with supposed to follow with neurology at some point but never established.  She has been taking Goody powders without much relief.  Does not have any chest pain or shortness of breath.  She has no PE or ACS risk factors.  I have no concern for ACS or PE.  She is asymptomatic now.  EKG shows sinus rhythm.  No ischemic changes.  No PACs or PVCs.  I have no concern for meningitis or infectious process.  These could be vasovagal events versus stress related process.  We have obtained labs including CBC CMP lipase urinalysis which per my review and interpretation are unremarkable.  There is no significant anemia or electrolyte abnormality.  She is not anemic or dehydrated.  Creatinine is normal.  I have ordered her headache cocktail with Compazine and Benadryl to try to improve her migraine.  CT scan of the has been ordered.  Ultimately we had a long discussion about what is been going on.  We will have her follow-up with  cardiology and neurology.  Assuming that head CT is normal anticipate discharge with follow-up with specialist to further rule out seizures, arrhythmia.  I strongly suggest that she establish with a primary care doctor to discuss stress related processes as well.  Family was at the bedside.  They are in agreement with this plan.  I have requested that she avoid dangerous activities including driving until she is cleared by neurology.  Patient handed off to oncoming ED staff with patient pending head CT.  Please see his note for further results, evaluation, disposition of the patient.  This chart was dictated using voice recognition software.  Despite best efforts to proofread,  errors can occur which can change the documentation meaning.         Final Clinical Impression(s) / ED Diagnoses Final diagnoses:  Syncope and collapse  Palpitations    Rx / DC Orders ED Discharge Orders          Ordered    Ambulatory referral to Cardiology       Comments: If you have not heard from the Cardiology office within the next 72 hours please call [phone].   09/17/23 2236    Ambulatory referral to Neurology       Comments: An appointment is requested in approximately: 1 week   09/17/23 2236              Virgina Norfolk, DO 09/17/23 2245

## 2023-09-17 NOTE — ED Triage Notes (Signed)
Reports has been feeling lightheaded and had syncopal episode that last 5 minutes.  Reports feeling her heart racing and palpitation.

## 2023-09-17 NOTE — Discharge Instructions (Addendum)
I have placed referral to neurology and cardiology for you.  Recommend 1000 mg of Tylenol every 6 hours as needed for headaches.  Recommend 800 mg ibuprofen every 8 hours as needed for headaches but do not exceed this for more than 2 to 3 days in a row.  I recommend discontinuing Goody powders.  As we discussed I request that you do not drive until you are seen by neurology.  Please avoid any dangerous activities as we discussed.  Follow-up with your primary care doctor.  Please return if symptoms worsen.  Workup today is unremarkable.

## 2023-09-18 ENCOUNTER — Encounter: Payer: Self-pay | Admitting: Internal Medicine

## 2023-09-18 ENCOUNTER — Emergency Department: Payer: Medicaid Other | Admitting: Internal Medicine

## 2023-09-18 VITALS — BP 122/94 | HR 95 | Ht 65.0 in | Wt 190.6 lb

## 2023-09-18 DIAGNOSIS — E860 Dehydration: Secondary | ICD-10-CM | POA: Diagnosis not present

## 2023-09-18 MED ORDER — SODIUM CHLORIDE 0.9 % IV BOLUS
500.0000 mL | Freq: Once | INTRAVENOUS | Status: AC
Start: 2023-09-18 — End: 2023-09-18
  Administered 2023-09-18: 500 mL via INTRAVENOUS

## 2023-09-18 NOTE — Patient Instructions (Signed)
Medication Instructions:   *If you need a refill on your cardiac medications before your next appointment, please call your pharmacy*   Lab Work:  If you have labs (blood work) drawn today and your tests are completely normal, you will receive your results only by: MyChart Message (if you have MyChart) OR A paper copy in the mail If you have any lab test that is abnormal or we need to change your treatment, we will call you to review the results.   Testing/Procedures:    Follow-Up: At [facility], you and your health needs are our priority.  As part of our continuing mission to provide you with exceptional heart care, we have created designated Provider Care Teams.  These Care Teams include your primary Cardiologist (physician) and Advanced Practice Providers (APPs -  Physician Assistants and Nurse Practitioners) who all work together to provide you with the care you need, when you need it.  We recommend signing up for the patient portal called "MyChart".  Sign up information is provided on this After Visit Summary.  MyChart is used to connect with patients for Virtual Visits (Telemedicine).  Patients are able to view lab/test results, encounter notes, upcoming appointments, etc.  Non-urgent messages can be sent to your provider as well.   To learn more about what you can do with MyChart, go to [email or web address].    Your next appointment:  as needed

## 2023-09-18 NOTE — Progress Notes (Signed)
Cardiology Office Note   Date:  09/18/2023   ID:  Adriana [name], DOB 07/16/1996, MRN [id number]  PCP:  Warden Fillers, MD  Cardiologist:   Dietrich Pates, MD   Pt referred for follow up of syncope      History of Present Illness: Adriana [name] is a 27 y.o. female    She went to the [facility] yesterday for a syncopal spell    The spell occurred about 2 wks ago  SHe woke up in the middle of the night   Went to get water Gustavus Bryant  Felt a little dizzy going to kitchen   after gettting walter got more dizzy and passed out on floor   lasted about 5 min   Felt weak after  SInce then she has continued to have some dizziness    Denies CP  Breathing is OK   No fevers  no diarrhea  PO intake is down some      No outpatient medications have been marked as taking for the 09/18/23 encounter (Office Visit) with Pricilla Riffle, MD.     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  No hx of oral or genital outbreaks. Recommend prophylaxis in any future pregnancies.   Migraines     Past Surgical History:  Procedure Laterality Date   HERNIA REPAIR     as an infant     Social History:  The patient  reports that she quit smoking about 7 years ago. Her smoking use included cigarettes. She has never used smokeless tobacco. She reports that she does not drink alcohol and does not use drugs.   Family History:  The patient's family history includes Healthy in her father and mother.    ROS:  Please see the history of present illness. All other systems are reviewed and  Negative to the above problem except as noted.    PHYSICAL EXAM: VS:  BP (!) 122/94   Pulse 95   Ht 5\' 5"  (1.651 m)   Wt 190 lb 9.6 oz (86.5 kg)   SpO2 98%   BMI 31.72  kg/m    BP laying   111/68  P 94   Sitting   122/74  HR 95 Standing 121/77  P 105  Standing 4 min 115/81  P 108   GEN: Well nourished, well developed, in no acute distress  HEENT: normal  Neck: no JVD, carotid bruit Cardiac: RRR; no murmur   No Le edema  Respiratory:  clear to auscultation  GI: soft, nontender, No hepatomegaly  MS: no deformity Moving all extremities   Skin: warm and dry, no rash Neuro:  Strength and sensation are intact Psych: euthymic mood, full affect   EKG:  EKG on 10/20  NSR     Lipid Panel No results found for: "CHOL", "TRIG", "HDL", "CHOLHDL", "VLDL", "LDLCALC", "LDLDIRECT"    Wt Readings from Last 3 Encounters:  09/18/23 190 lb 9.6 oz (86.5 kg)  09/17/23 196 lb (88.9 kg)  12/20/22 196 lb 4.8 oz (89 kg)      ASSESSMENT AND PLAN:  1  SYncope   Pt with recent spell in middle of night    Has not felt great since, still weak.  PO intake is not back to normal Review of labs from hospital, her urine was concentrated  No evid of infection I think pt may be a bit dry    I would recomm fluids/salt  I would recomm IV NS today      Encouraged her to increase fluid and salt intake   At least 3 L fluid per day   Addendum:  Afte 500 CC NS pt feeling some better     Follow up as needed if symtpoms do not improve/resolve    Stay active     Current medicines are reviewed at length with the patient today.  The patient does not have concerns regarding medicines.  Signed, Dietrich Pates, MD  09/18/2023 3:16 PM    [facility] [address] [place], [place], [place]  [address] Phone: [phone]; Fax: [phone]

## 2023-09-20 ENCOUNTER — Encounter: Payer: Self-pay | Admitting: Diagnostic Neuroimaging

## 2023-09-20 ENCOUNTER — Ambulatory Visit: Payer: Self-pay | Admitting: Diagnostic Neuroimaging

## 2023-09-26 ENCOUNTER — Encounter: Payer: Self-pay | Admitting: Neurology

## 2023-09-26 ENCOUNTER — Ambulatory Visit (INDEPENDENT_AMBULATORY_CARE_PROVIDER_SITE_OTHER): Payer: Medicaid Other | Admitting: Neurology

## 2023-09-26 VITALS — BP 121/76 | HR 91 | Ht 67.0 in | Wt 191.2 lb

## 2023-09-26 DIAGNOSIS — G43009 Migraine without aura, not intractable, without status migrainosus: Secondary | ICD-10-CM

## 2023-09-26 DIAGNOSIS — R55 Syncope and collapse: Secondary | ICD-10-CM | POA: Diagnosis not present

## 2023-09-26 MED ORDER — ZOLMITRIPTAN 5 MG PO TABS: 5.0000 mg | ORAL_TABLET | ORAL | 11 refills | Status: AC | PRN

## 2023-09-26 MED ORDER — NURTEC 75 MG PO TBDP: 75.0000 mg | ORAL_TABLET | ORAL | 11 refills | Status: DC

## 2023-09-26 NOTE — Patient Instructions (Addendum)
Routine EEG Start Nurtec every other day for migraine prevention Use Zomig as needed for migraine headache, can combine it with Aleve or Motrin Follow-up in 6 months or sooner if worse.

## 2023-09-26 NOTE — Progress Notes (Signed)
[facility]  PATIENT: Adriana [name] DOB: 1995-12-01  REQUESTING CLINICIAN: Warden Fillers, MD HISTORY FROM: Patient  REASON FOR VISIT: Syncope/Headaches    HISTORICAL  CHIEF COMPLAINT:  Chief Complaint  Patient presents with   Follow-up    Rm 12. Patient with boyfriend. Patient reporting headaches for multiple weeks out of the month, 6 migraine days. Patient states tylenol and OTC medications are not helping, lights are a trigger and so is smell. One passing out episode 10.11.24 where she passed out completely and two more since then that has resembled the first one but did not go unconscious.     HISTORY OF PRESENT ILLNESS:  This is a 27 year old woman past medical history of migraine headaches, who is presenting after a syncopal episode.  Patient reports passing out on October 11.  She reports feel her heart racing, blurry vision, feeling hot, she went to the kitchen to get a glass of water then passed out on the floor. She was found by her mother-in-law.  Denies any convulsion and no post ictal confusion, no urine or bowel incontinence and no tongue biting.  She reports on October 20, she had the same feeling of confusion, blurry vision but this episode was not associated with loss of consciousness.  She presented to the ED, head CT negative for any acute abnormality.  Diagnosed with presyncope and referred here. On top of her syncopal episodes, patient also complained of headaches.  She does report a history of migraines since the age of 81 and 32, never been prescribed any preventive medication but was given Imitrex at one point but she had a bad reaction, "locking of her jaw".  Since then, she has been taking BC powder, sometime Tylenol for headache.  Headache associated with migrainous features, photophobia, phonophobia and nausea but no vomiting.  Denies any family history of headaches.   Headache History and Characteristics: Onset: Since the age of 46 or  2 Location: Frontal headaches Quality:  pounding headaches  Intensity: 10/10.  Duration: Can last then entire day  Migrainous Features: Photophobia, phonophobia, nausea.  Aura: No  History of brain injury or tumor: No  Family history: None reported  Motion sickness: no Cardiac history: no  OTC: tylenol, BC powder  Caffeine: Yes  Sleep: 7 to 8 hours  Mood/ Stress: 5  Prior prophylaxis: Propranolol: No  Verapamil:No TCA: No Topamax: No Depakote: No Effexor: No Cymbalta: No Neurontin:No  Prior abortives: Triptan: Yes, sumatriptan but had "locked Jaw" with Sumatriptan Anti-emetic: No Steroids: No Ergotamine suppository: No    OTHER MEDICAL CONDITIONS: Migraines headaches, Syncope    REVIEW OF SYSTEMS: Full 14 system review of systems performed and negative with exception of: As noted in the HPI   ALLERGIES: Allergies  Allergen Reactions   Imitrex [Sumatriptan] Other (See Comments)    Chest pain, heaviness, migraine   Latex Rash    HOME MEDICATIONS: Outpatient Medications Prior to Visit  Medication Sig Dispense Refill   acetaminophen (TYLENOL) 325 MG tablet Take 2 tablets (650 mg total) by mouth every 4 (four) hours as needed (for pain scale < 4). (Patient not taking: Reported on 09/26/2023) 30 tablet 0   coconut oil OIL Apply 1 Application topically as needed. (Patient not taking: Reported on 09/26/2023) 120 mL 0   doxycycline (VIBRAMYCIN) 100 MG capsule Take 1 capsule (100 mg total) by mouth 2 (two) times daily. (Patient not taking: Reported on 09/26/2023) 14 capsule 0   ferrous sulfate (FERROUSUL) 325 (65 FE) MG  tablet Take 1 tablet (325 mg total) by mouth every other day. (Patient not taking: Reported on 09/26/2023) 30 tablet 3   fluconazole (DIFLUCAN) 150 MG tablet Take one tab PO today and repeat dose in 3 days if symptoms persist (Patient not taking: Reported on 09/26/2023) 2 tablet 0   ibuprofen (ADVIL) 600 MG tablet Take 1 tablet (600 mg total) by mouth  every 6 (six) hours. (Patient not taking: Reported on 09/26/2023) 30 tablet 0   metroNIDAZOLE (METROGEL) 0.75 % vaginal gel Place 1 Applicatorful vaginally at bedtime. Apply one applicatorful to vagina at bedtime for 5 days (Patient not taking: Reported on 09/26/2023) 70 g 1   NIFEdipine (PROCARDIA XL) 30 MG 24 hr tablet Take 1 tablet (30 mg total) by mouth daily. (Patient not taking: Reported on 09/26/2023) 30 tablet 1   norethindrone (ORTHO MICRONOR) 0.35 MG tablet Take 1 tablet (0.35 mg total) by mouth daily. (Patient not taking: Reported on 09/18/2023) 28 tablet 1   Prenatal Vit-Fe Phos-FA-Omega (VITAFOL GUMMIES) 3.33-0.333-34.8 MG CHEW Chew 3 tablets by mouth daily. (Patient not taking: Reported on 09/26/2023) 90 tablet 11   No facility-administered medications prior to visit.    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  No hx of oral or genital outbreaks. Recommend prophylaxis in any future pregnancies.   Migraines     PAST SURGICAL HISTORY: Past Surgical History:  Procedure Laterality Date   HERNIA REPAIR     as an infant    FAMILY HISTORY: Family History  Problem Relation Age of Onset   Healthy Mother    Healthy Father     SOCIAL HISTORY: Social History   Socioeconomic History   Marital status: Single    Spouse name: Not on file   Number of children: 2   Years of education: Not on file   Highest education level: Not on file  Occupational History   Occupation: Unemployed    Comment: non employeed  Tobacco Use   Smoking status: Former    Current packs/day: 0.00    Types: Cigarettes    Quit date: 06/30/2016    Years since quitting: 7.2   Smokeless tobacco: Never  Vaping Use   Vaping status: Never Used  Substance and  Sexual Activity   Alcohol use: No    Alcohol/week: 0.0 standard drinks of alcohol   Drug use: No   Sexual activity: Yes    Partners: Male    Birth control/protection: Pill  Other Topics Concern   Not on file  Social History Narrative   Not on file   Social Determinants of Health   Financial Resource Strain: Low Risk  (07/25/2018)   Overall Financial Resource Strain (CARDIA)    Difficulty of Paying Living Expenses: Not hard at all  Food Insecurity: No Food Insecurity (09/06/2022)   Hunger Vital Sign    Worried About Running Out of Food in the Last Year: Never true    Ran Out of Food in the Last Year: Never true  Transportation Needs: No Transportation Needs (09/06/2022)   PRAPARE - Administrator, Civil Service (Medical): No    Lack of Transportation (Non-Medical): No  Physical Activity: Not on file  Stress: Not on file  Social Connections: Not on file  Intimate Partner Violence: Not At Risk (09/06/2022)   Humiliation, Afraid, Rape, and Kick questionnaire    Fear of Current or Ex-Partner: No    Emotionally Abused: No    Physically Abused: No    Sexually Abused: No    PHYSICAL EXAM  GENERAL EXAM/CONSTITUTIONAL: Vitals:  Vitals:   09/26/23 1507  BP: 121/76  Pulse: 91  Weight: 191 lb 3.2 oz (86.7 kg)  Height: 5\' 7"  (1.702 m)   Body mass index is 29.95 kg/m. Wt Readings from Last 3 Encounters:  09/26/23 191 lb 3.2 oz (86.7 kg)  09/18/23 190 lb 9.6 oz (86.5 kg)  09/17/23 196 lb (88.9 kg)   Patient is in no distress; well developed, nourished and groomed; neck is supple  MUSCULOSKELETAL: Gait, strength, tone, movements noted in Neurologic exam below  NEUROLOGIC: MENTAL STATUS:      No data to display         awake, alert, oriented to person, place and time recent and remote memory intact normal attention and concentration language fluent, comprehension intact, naming intact fund of knowledge appropriate  CRANIAL NERVE:  2nd - no papilledema  or hemorrhages on fundoscopic exam 2nd, 3rd, 4th, 6th - pupils equal and reactive to light, visual fields full to confrontation, extraocular muscles intact, no nystagmus 5th - facial sensation symmetric 7th - facial strength symmetric 8th - hearing intact 9th - palate elevates symmetrically, uvula midline 11th - shoulder shrug symmetric 12th - tongue protrusion midline  MOTOR:  normal bulk and tone, full strength in the BUE, BLE  SENSORY:  normal and symmetric to light touch, pinprick  COORDINATION:  finger-nose-finger, fine finger movements normal  GAIT/STATION:  normal    DIAGNOSTIC DATA (LABS, IMAGING, TESTING) - I reviewed patient records, labs, notes, testing and imaging myself where available.  Lab Results  Component Value Date   WBC 5.8 09/17/2023   HGB 12.4 09/17/2023   HCT 37.8 09/17/2023   MCV 100.3 (H) 09/17/2023   PLT 297 09/17/2023      Component Value Date/Time   NA 138 09/17/2023 1928   NA 145 (H) 02/25/2019 1155   K 4.2 09/17/2023 1928   CL 105 09/17/2023 1928   CO2 23 09/17/2023 1928   GLUCOSE 96 09/17/2023 1928   BUN 11 09/17/2023 1928   BUN 11 02/25/2019 1155   CREATININE 0.70 09/17/2023 1928   CALCIUM 9.5 09/17/2023 1928   PROT 7.2 09/17/2023 1928   PROT 7.3 02/25/2019 1155   ALBUMIN 4.0 09/17/2023 1928   ALBUMIN 4.3 02/25/2019 1155   AST 18 09/17/2023 1928   ALT 16 09/17/2023 1928   ALKPHOS 43 09/17/2023 1928   BILITOT 0.5 09/17/2023 1928   BILITOT 0.3 02/25/2019 1155   GFRNONAA >60 09/17/2023 1928   GFRAA 103 02/25/2019 1155   No results found for: "CHOL", "HDL", "LDLCALC", "LDLDIRECT", "TRIG", "CHOLHDL" Lab Results  Component Value Date   HGBA1C 5.0 08/14/2019   No results found for: "VITAMINB12" No results found for: "TSH"  Head CT 09/17/2023 No acute intracranial abnormality.    ASSESSMENT AND PLAN  27 y.o. year old female with history of migraine who is presenting for syncope and management of her migraines.  For her  syncopal episode, she does have prodrome of feeling hot, heart racing, vision going gray, I suspect vasovagal syncope but will obtain a routine EEG to rule out seizures.  For her migraines, we will give her Nurtec as preventive medication and Zomig as abortive medication.  I did advise her  to take the Zomig with Ibuprofen or Aleve for better result.  I will see her in 6 months for follow-up but patient understands to contact me sooner if symptoms do get worse or any side effect from the medication.   1. Syncope, unspecified syncope type   2. Migraine without aura and without status migrainosus, not intractable     Patient Instructions  Routine EEG Start Nurtec every other day for migraine prevention Use Zomig as needed for migraine headache, can combine it with Aleve or Motrin Follow-up in 6 months or sooner if worse.   Orders Placed This Encounter  Procedures   EEG adult    Meds ordered this encounter  Medications   Rimegepant Sulfate (NURTEC) 75 MG TBDP    Sig: Take 1 tablet (75 mg total) by mouth every other day for 360 doses.    Dispense:  30 tablet    Refill:  11   zolmitriptan (ZOMIG) 5 MG tablet    Sig: Take 1 tablet (5 mg total) by mouth as needed for migraine.    Dispense:  10 tablet    Refill:  11    Return in about 6 months (around 03/26/2024).    Windell Norfolk, MD 09/26/2023, 5:39 PM  [facility] [address], [address] [place], [place] [address] [phone]

## 2023-10-05 ENCOUNTER — Other Ambulatory Visit (HOSPITAL_COMMUNITY): Payer: Self-pay

## 2023-10-05 ENCOUNTER — Telehealth: Payer: Self-pay

## 2023-10-05 NOTE — Telephone Encounter (Signed)
Pharmacy Patient Advocate Encounter   Received notification from Patient Pharmacy that prior authorization for Nurtec 75MG  dispersible tablets is required/requested.   Insurance verification completed.   The patient is insured through [facility] .   Per test claim: PA required; PA started via CoverMyMeds. KEY [id number] . Waiting for clinical questions to populate.

## 2023-10-06 NOTE — Telephone Encounter (Signed)
Clinical questions have been submitted-awaiting determination. 

## 2023-10-10 ENCOUNTER — Other Ambulatory Visit: Payer: Self-pay | Admitting: Neurology

## 2023-10-10 MED ORDER — AMITRIPTYLINE HCL 25 MG PO TABS: 25.0000 mg | ORAL_TABLET | Freq: Every day | ORAL | 3 refills | Status: AC

## 2023-10-10 NOTE — Telephone Encounter (Signed)
I was told that Nurtec was preferred for headaches prevention, patient does not need to fail prior therapy. I will text the rep.

## 2023-10-10 NOTE — Telephone Encounter (Signed)
Pharmacy Patient Advocate Encounter  Received notification from [facility] that Prior Authorization for Nurtec 75MG  dispersible tablets has been DENIED.  Full denial letter will be uploaded to the media tab. See denial reason below.                     PA #/Case ID/Reference #: PA Case ID #: [id number]

## 2023-11-06 ENCOUNTER — Telehealth: Payer: Self-pay | Admitting: Neurology

## 2023-11-06 ENCOUNTER — Other Ambulatory Visit: Payer: Medicaid Other | Admitting: *Deleted

## 2023-11-06 ENCOUNTER — Ambulatory Visit (INDEPENDENT_AMBULATORY_CARE_PROVIDER_SITE_OTHER): Payer: Medicaid Other | Admitting: Neurology

## 2023-11-06 DIAGNOSIS — R55 Syncope and collapse: Secondary | ICD-10-CM

## 2023-11-06 NOTE — Procedures (Signed)
    History:  27 year old woman with syncope  EEG classification: Awake and drowsy  Duration: 27 minutes   Technical aspects: This EEG study was done with scalp electrodes positioned according to the 10-20 International system of electrode placement. Electrical activity was reviewed with band pass filter of 1-70Hz , sensitivity of 7 uV/mm, display speed of 14mm/sec with a 60Hz  notched filter applied as appropriate. EEG data were recorded continuously and digitally stored.   Description of the recording: The background rhythms of this recording consists of a fairly well modulated medium amplitude alpha rhythm of 10 Hz that is reactive to eye opening and closure. Present in the anterior head region is a 15-20 Hz beta activity. Photic stimulation was performed, did not show any abnormalities. Hyperventilation was also performed, did not show any abnormalities. Drowsiness was manifested by background fragmentation. No abnormal epileptiform discharges seen during this recording. There was no focal slowing. There were no electrographic seizure identified.   Abnormality: None   Impression: This is a normal EEG recorded while drowsy and awake. No evidence of interictal epileptiform discharges. Normal EEGs, however, do not rule out epilepsy.    Windell Norfolk, MD [facility]

## 2023-11-06 NOTE — Telephone Encounter (Signed)
Pt called running late, request to change appt time. Changed appt to 3:15 pm for the same day.

## 2024-01-09 ENCOUNTER — Encounter (HOSPITAL_COMMUNITY): Payer: Self-pay

## 2024-01-09 ENCOUNTER — Ambulatory Visit (HOSPITAL_COMMUNITY)
Admission: EM | Admit: 2024-01-09 | Discharge: 2024-01-09 | Disposition: A | Discharge status: Home / Self Care | Payer: Medicaid Other | Attending: Emergency Medicine | Admitting: Emergency Medicine

## 2024-01-09 DIAGNOSIS — J988 Other specified respiratory disorders: Secondary | ICD-10-CM | POA: Diagnosis not present

## 2024-01-09 DIAGNOSIS — B9789 Other viral agents as the cause of diseases classified elsewhere: Secondary | ICD-10-CM

## 2024-01-09 LAB — POC COVID19/FLU A&B COMBO
Covid Antigen, POC: NEGATIVE
Influenza A Antigen, POC: NEGATIVE
Influenza B Antigen, POC: NEGATIVE

## 2024-01-09 NOTE — Discharge Instructions (Signed)
You tested for COVID and flu today.  I believe your symptoms are from a viral illness. You can alternate between Tylenol and Ibuprofen as needed for pain and fever. I recommend Mucinex for cough and congestion as needed. Stay hydrated and get plenty of rest. Return here if symptoms persist or worsen.

## 2024-01-09 NOTE — ED Triage Notes (Signed)
Pt c/o cough, headache, body ache, and chest tightness since yesterday. Denies taking meds.

## 2024-01-09 NOTE — ED Provider Notes (Signed)
[facility]    CSN: [id number] Arrival date & time: 01/09/24  1824      History   Chief Complaint Chief Complaint  Patient presents with   Cough    HPI Adriana [name] is a 28 y.o. female.   Patient presents with cough, headache, diarrhea, body aches, chest and nasal congestion, chills, and fever that began yesterday.  Denies shortness of breath, chest pain, abdominal pain, nausea, and vomiting.  Denies taking anything for symptoms.   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  No hx of oral or genital outbreaks. Recommend prophylaxis in any future pregnancies.   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 Authorizing Provider  acetaminophen (TYLENOL) 325 MG tablet Take 2 tablets (650 mg total) by mouth every 4 (four) hours as needed (for pain scale < 4). Patient not taking: Reported on 09/26/2023 09/08/22   Margie Billet, MD  amitriptyline (ELAVIL) 25 MG tablet Take 1 tablet (25 mg total) by mouth at bedtime. 10/10/23   Windell Norfolk, MD  coconut oil OIL Apply 1  Application topically as needed. Patient not taking: Reported on 09/26/2023 09/08/22   Margie Billet, MD  doxycycline (VIBRAMYCIN) 100 MG capsule Take 1 capsule (100 mg total) by mouth 2 (two) times daily. Patient not taking: Reported on 09/26/2023 12/23/22   Adam Phenix, MD  ferrous sulfate (FERROUSUL) 325 (65 FE) MG tablet Take 1 tablet (325 mg total) by mouth every other day. Patient not taking: Reported on 09/26/2023 07/09/22   Milas Hock, MD  fluconazole (DIFLUCAN) 150 MG tablet Take one tab PO today and repeat dose in 3 days if symptoms persist Patient not taking: Reported on 09/26/2023 07/05/23   Tomi Bamberger, PA-C  ibuprofen (ADVIL) 600 MG tablet Take 1 tablet (600 mg total) by mouth every 6 (six) hours. Patient not taking: Reported on 09/26/2023 09/08/22   Margie Billet, MD  metroNIDAZOLE (METROGEL) 0.75 % vaginal gel Place 1 Applicatorful vaginally at bedtime. Apply one applicatorful to vagina at bedtime for 5 days Patient not taking: Reported on 09/26/2023 12/23/22   Adam Phenix, MD  NIFEdipine (PROCARDIA XL) 30 MG 24 hr tablet Take 1 tablet (30 mg total) by mouth daily. Patient not taking: Reported on 09/26/2023 10/06/22  Brock Bad, MD  norethindrone (ORTHO MICRONOR) 0.35 MG tablet Take 1 tablet (0.35 mg total) by mouth daily. Patient not taking: Reported on 09/18/2023 09/25/22 09/25/23  Margie Billet, MD  Prenatal Vit-Fe Phos-FA-Omega (VITAFOL GUMMIES) 3.33-0.333-34.8 MG CHEW Chew 3 tablets by mouth daily. Patient not taking: Reported on 09/26/2023 02/09/22   Warden Fillers, MD  zolmitriptan (ZOMIG) 5 MG tablet Take 1 tablet (5 mg total) by mouth as needed for migraine. 09/26/23 10/26/23  Windell Norfolk, MD    Family History Family History  Problem Relation Age of Onset   Healthy Mother    Healthy Father     Social History Social History   Tobacco Use   Smoking status: Former    Current packs/day: 0.00    Types: Cigarettes     Quit date: 06/30/2016    Years since quitting: 7.5   Smokeless tobacco: Never  Vaping Use   Vaping status: Never Used  Substance Use Topics   Alcohol use: No    Alcohol/week: 0.0 standard drinks of alcohol   Drug use: No     Allergies   Imitrex [sumatriptan] and Latex   Review of Systems Review of Systems  Respiratory:  Positive for cough.    Per HPI  Physical Exam Triage Vital Signs ED Triage Vitals  Encounter Vitals Group     BP 01/09/24 1929 139/85     Systolic BP Percentile --      Diastolic BP Percentile --      Pulse Rate 01/09/24 1929 93     Resp 01/09/24 1929 18     Temp 01/09/24 1929 100 F (37.8 C)     Temp Source 01/09/24 1929 Oral     SpO2 01/09/24 1929 97 %     Weight --      Height --      Head Circumference --      Peak Flow --      Pain Score 01/09/24 1936 6     Pain Loc --      Pain Education --      Exclude from Growth Chart --    No data found.  Updated Vital Signs BP 139/85 (BP Location: Left Arm)   Pulse 93   Temp 100 F (37.8 C) (Oral)   Resp 18   SpO2 97%   Breastfeeding No   Visual Acuity Right Eye Distance:   Left Eye Distance:   Bilateral Distance:    Right Eye Near:   Left Eye Near:    Bilateral Near:     Physical Exam Vitals and nursing note reviewed.  Constitutional:      General: She is awake. She is not in acute distress.    Appearance: Normal appearance. She is well-developed and well-groomed. She is not ill-appearing.  HENT:     Right Ear: Tympanic membrane, ear canal and external ear normal.     Left Ear: Tympanic membrane, ear canal and external ear normal.     Nose: Congestion and rhinorrhea present.     Mouth/Throat:     Mouth: Mucous membranes are moist.     Pharynx: Posterior oropharyngeal erythema present.  Cardiovascular:     Rate and Rhythm: Normal rate and regular rhythm.  Pulmonary:     Effort: Pulmonary effort is normal.     Breath sounds: Normal breath sounds.  Abdominal:     General: Abdomen  is flat. Bowel sounds are normal.     Palpations: Abdomen is soft.  Tenderness: There is no abdominal tenderness.  Musculoskeletal:        General: Normal range of motion.     Cervical back: Normal range of motion and neck supple.  Skin:    General: Skin is warm and dry.  Neurological:     Mental Status: She is alert.  Psychiatric:        Behavior: Behavior is cooperative.      UC Treatments / Results  Labs (all labs ordered are listed, but only abnormal results are displayed) Labs Reviewed  POC COVID19/FLU A&B COMBO    EKG   Radiology No results found.  Procedures Procedures (including critical care time)  Medications Ordered in UC Medications - No data to display  Initial Impression / Assessment and Plan / UC Course  I have reviewed the triage vital signs and the nursing notes.  Pertinent labs & imaging results that were available during my care of the patient were reviewed by me and considered in my medical decision making (see chart for details).     Patient presented with cough, headache, diarrhea, body aches, chest and nasal congestion, chills, and fever that began yesterday.  Denies any other symptoms.  Upon assessment congestion and rhinorrhea are present, mild erythema noted to pharynx.  Lungs clear bilaterally on auscultation.  Nontender upon palpation to abdomen.  COVID and flu testing were negative.  Discussed over-the-counter medications for symptoms.  Discussed importance of hydration.  Discussed return precautions. Final Clinical Impressions(s) / UC Diagnoses   Final diagnoses:  Viral respiratory illness     Discharge Instructions      You tested for COVID and flu today.  I believe your symptoms are from a viral illness. You can alternate between Tylenol and Ibuprofen as needed for pain and fever. I recommend Mucinex for cough and congestion as needed. Stay hydrated and get plenty of rest. Return here if symptoms persist or worsen.       ED Prescriptions   None    PDMP not reviewed this encounter.   Wynonia Lawman A, NP 01/09/24 2026

## 2024-05-30 ENCOUNTER — Ambulatory Visit

## 2024-06-14 ENCOUNTER — Ambulatory Visit

## 2024-09-21 ENCOUNTER — Ambulatory Visit: Admission: EM | Admit: 2024-09-21 | Discharge: 2024-09-21 | Disposition: A | Discharge status: Home / Self Care

## 2024-09-21 DIAGNOSIS — F419 Anxiety disorder, unspecified: Secondary | ICD-10-CM

## 2024-09-21 MED ORDER — HYDROXYZINE HCL 25 MG PO TABS: 25.0000 mg | ORAL_TABLET | Freq: Three times a day (TID) | ORAL | 3 refills | Status: AC | PRN

## 2024-09-21 NOTE — Discharge Instructions (Signed)
  1. Anxiety (Primary) - EKG 12-Lead completed in UC shows normal sinus rhythm with a ventricular rate of 66 bpm, no STEMI, normal EKG. - hydrOXYzine (ATARAX) 25 MG tablet; Take 1 tablet (25 mg total) by mouth every 8 (eight) hours as needed for anxiety.  Dispense: 30 tablet; Refill: 3 -Continue to monitor symptoms for any change in severity if there is any escalation of current symptoms or development of new symptoms follow-up in ER for further evaluation and management.

## 2024-09-21 NOTE — ED Provider Notes (Signed)
 [facility]  Note:  This document was prepared using Conservation officer, historic buildings and may include unintentional dictation errors.  MRN: [id number] DOB: 1996-10-29  Subjective:   Adriana [name] is a 28 y.o. female presenting for evaluation of chest tightness that lasted approximately 2 to 3 hours yesterday.  Patient states that symptoms initially started while sitting down doing nothing but have been off and on for approximately a week or 2.  Patient was previously seen in the ER for chest pain and full evaluation completed.  Patient reports having some stress and anxiety but does not take any medication for an otitis symptoms.  Patient has a follow-up appointment with her PCP but was unable to be evaluated this quickly after the onset of symptoms.  Patient denies shortness of breath, wheezing, dizziness, weakness, cough, weakness.  No current facility-administered medications for this encounter.  Current Outpatient Medications:    hydrOXYzine (ATARAX) 25 MG tablet, Take 1 tablet (25 mg total) by mouth every 8 (eight) hours as needed for anxiety., Disp: 30 tablet, Rfl: 3   norethindrone  (ORTHO MICRONOR ) 0.35 MG tablet, Take 1 tablet (0.35 mg total) by mouth daily., Disp: 28 tablet, Rfl: 1   amitriptyline  (ELAVIL ) 25 MG tablet, Take 1 tablet (25 mg total) by mouth at bedtime., Disp: 30 tablet, Rfl: 3   NIFEdipine  (PROCARDIA  XL) 30 MG 24 hr tablet, Take 1 tablet (30 mg total) by mouth daily. 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  No hx of oral or genital outbreaks. Recommend prophylaxis in any future pregnancies.   Migraines      Past Surgical History:  Procedure Laterality Date   HERNIA REPAIR     as an infant    Family History  Problem Relation Age of Onset   Healthy Mother    Healthy Father     Social History   Tobacco Use   Smoking status: Some Days    Current packs/day: 0.00    Types: Cigarettes    Last attempt to quit: 06/30/2016    Years since quitting: 8.2    Passive exposure: Never   Smokeless tobacco: Never  Vaping Use   Vaping status: Never Used  Substance Use Topics   Alcohol use: No    Alcohol/week: 0.0 standard drinks of alcohol   Drug use: No    ROS Refer to HPI for ROS details.  Objective:   Vitals: BP (!) 114/58 (BP Location: Right Arm)   Pulse 69   Temp 97.7 F (36.5 C) (Oral)   Resp 18   Ht 5' 7 (1.702 m)   Wt 180 lb (81.6 kg)   LMP 09/02/2024 (Exact Date)   SpO2 98%   BMI 28.19 kg/m   Physical Exam Vitals and nursing note reviewed.  Constitutional:      General: She is not in acute distress.    Appearance: She is well-developed. She is not ill-appearing  or toxic-appearing.  HENT:     Head: Normocephalic and atraumatic.     Nose: Nose normal.     Mouth/Throat:     Mouth: Mucous membranes are moist.     Pharynx: Oropharynx is clear.  Cardiovascular:     Rate and Rhythm: Normal rate and regular rhythm.     Heart sounds: Normal heart sounds. No murmur heard. Pulmonary:     Effort: Pulmonary effort is normal. No respiratory distress.     Breath sounds: Normal breath sounds. No stridor. No wheezing, rhonchi or rales.  Chest:     Chest wall: No tenderness.  Skin:    General: Skin is warm and dry.  Neurological:     General: No focal deficit present.     Mental Status: She is alert and oriented to person, place, and time.  Psychiatric:        Mood and  Affect: Mood normal.        Behavior: Behavior normal.     Procedures  No results found for this or any previous visit (from the past 24 hours).  No results found.   Assessment and Plan :     Discharge Instructions       1. Anxiety (Primary) - EKG 12-Lead completed in UC shows normal sinus rhythm with a ventricular rate of 66 bpm, no STEMI, normal EKG. - hydrOXYzine (ATARAX) 25 MG tablet; Take 1 tablet (25 mg total) by mouth every 8 (eight) hours as needed for anxiety.  Dispense: 30 tablet; Refill: 3 -Continue to monitor symptoms for any change in severity if there is any escalation of current symptoms or development of new symptoms follow-up in ER for further evaluation and management.       Radhika Dershem B Dynastee Brummell   Khadeem Rockett, Painter B, [place] 09/21/24 1231

## 2024-09-21 NOTE — ED Triage Notes (Signed)
 Patient reports having Chest Pain tightness for about 2-3 hrs yesterday (this being last episode) when setting down doing nothing. This has been on/off for about a week or two. Seen for this prior with stress test. Upcoming appt with PCP for follow up. No sob. No wheezing. Also states she has gas coming up from stomach too.

## 2024-09-21 NOTE — ED Notes (Signed)
 EKG done (Verbal order, Secure chat) with provider.

## 2024-12-14 ENCOUNTER — Encounter: Payer: Self-pay | Admitting: Emergency Medicine

## 2024-12-14 ENCOUNTER — Ambulatory Visit
Admission: EM | Admit: 2024-12-14 | Discharge: 2024-12-14 | Disposition: A | Discharge status: Home / Self Care | Attending: Student | Admitting: Student

## 2024-12-14 ENCOUNTER — Other Ambulatory Visit: Payer: Self-pay

## 2024-12-14 DIAGNOSIS — J101 Influenza due to other identified influenza virus with other respiratory manifestations: Secondary | ICD-10-CM

## 2024-12-14 LAB — POCT INFLUENZA A/B
Influenza A, POC: NEGATIVE
Influenza B, POC: POSITIVE — AB

## 2024-12-14 MED ORDER — PROMETHAZINE-DM 6.25-15 MG/5ML PO SYRP: 5.0000 mL | ORAL_SOLUTION | Freq: Four times a day (QID) | ORAL | 0 refills | Status: AC | PRN

## 2024-12-14 MED ORDER — OSELTAMIVIR PHOSPHATE 75 MG PO CAPS: 75.0000 mg | ORAL_CAPSULE | Freq: Two times a day (BID) | ORAL | 0 refills | Status: AC

## 2024-12-14 MED ORDER — ONDANSETRON 4 MG PO TBDP: 4.0000 mg | ORAL_TABLET | Freq: Three times a day (TID) | ORAL | 0 refills | Status: AC | PRN

## 2024-12-14 NOTE — ED Triage Notes (Signed)
 Pt here for cough, body aches and fever x 3 days; pt sts her children have flu currently

## 2024-12-14 NOTE — Discharge Instructions (Addendum)
-  You have influenza (the flu) -We are treating this with a medication called Tamiflu , which is an antiviral.  This will help reduce the severity and duration of the fluid. -Take the Zofran  (ondansetron ) up to 3 times daily if you develop nausea and vomiting. Dissolve one pill under your tongue or between your teeth and your cheek -Promethazine  DM cough syrup for congestion/cough. This could make you drowsy, so take at night before bed. -Use tylenol and/or ibuprofen for fever reduction or bodyaches, if needed.  Follow the instructions on the bottle for appropriate dosage.  You can take both Tylenol and ibuprofen; you can take them at the same time, or alternate every few hours (for example, Tylenol, then 4 hours later ibuprofen, then 4 hours later Tylenol, etc.) -Viruses like the flu typically last 5 to 7 days.  After 7 days, your symptoms should be improving rather than worsening.  If your symptoms improve, and then worsen again, this is when we worry about a sinus infection or a lung infection, and you should return for additional care.  If at any point you develop fevers that do not reduce with Tylenol, shortness of breath, a cough productive of dark or red sputum, or other symptoms that concern you, seek additional care.

## 2024-12-14 NOTE — ED Provider Notes (Signed)
 " [facility]    CSN: [id number] Arrival date & time: 12/14/24  0800      History   Chief Complaint Chief Complaint  Patient presents with   Cough    HPI Adriana [name] is a 29 y.o. female presenting with flulike symptoms. Pt here for cough, body aches and fever x 3 days; pt sts her children have flu currently. Temperature running 100.4 at home. Last tylenol  was 0400 today (4 hours ago). Decreased appetite and few episodes of bilious emesis. Frequent cough.   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  No hx of oral or genital outbreaks. Recommend prophylaxis in any future pregnancies.   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 Authorizing Provider  ondansetron  (ZOFRAN -ODT) 4 MG disintegrating tablet Take 1 tablet (4 mg total) by mouth every 8 (eight) hours as needed for nausea or vomiting. 12/14/24  Yes Arlyss Leita BRAVO, PA-C  oseltamivir  (TAMIFLU ) 75 MG capsule Take 1 capsule (75 mg total) by mouth every 12 (twelve) hours. 12/14/24   Yes Ronneisha Jett E, PA-C  promethazine -dextromethorphan (PROMETHAZINE -DM) 6.25-15 MG/5ML syrup Take 5 mLs by mouth 4 (four) times daily as needed for cough. 12/14/24  Yes Arlyss Leita BRAVO, PA-C  amitriptyline  (ELAVIL ) 25 MG tablet Take 1 tablet (25 mg total) by mouth at bedtime. 10/10/23   Camara, Amadou, MD  hydrOXYzine  (ATARAX ) 25 MG tablet Take 1 tablet (25 mg total) by mouth every 8 (eight) hours as needed for anxiety. 09/21/24   Reddick, Johnathan B, NP  norethindrone  (ORTHO MICRONOR ) 0.35 MG tablet Take 1 tablet (0.35 mg total) by mouth daily. 09/25/22 09/21/24  Doyce Sharper, MD  zolmitriptan  (ZOMIG ) 5 MG tablet Take 1 tablet (5 mg total) by mouth as needed for migraine. 09/26/23 10/26/23  Gregg Lek, MD    Family History Family History  Problem Relation Age of Onset   Healthy Mother    Healthy Father     Social History Social History[1]   Allergies   Sumatriptan  and Latex   Review of Systems Review of Systems  Constitutional:  Positive for fever. Negative for appetite change and chills.  HENT:  Positive for sore throat. Negative for congestion, ear pain, rhinorrhea, sinus  pressure and sinus pain.   Eyes:  Negative for redness and visual disturbance.  Respiratory:  Positive for cough. Negative for chest tightness, shortness of breath and wheezing.   Cardiovascular:  Negative for chest pain and palpitations.  Gastrointestinal:  Negative for abdominal pain, constipation, diarrhea, nausea and vomiting.  Genitourinary:  Negative for dysuria, frequency and urgency.  Musculoskeletal:  Positive for myalgias.  Neurological:  Negative for dizziness, weakness and headaches.  Psychiatric/Behavioral:  Negative for confusion.   All other systems reviewed and are negative.    Physical Exam Triage Vital Signs ED Triage Vitals  Encounter Vitals Group     BP      Girls Systolic BP Percentile      Girls Diastolic BP Percentile      Boys Systolic BP Percentile      Boys  Diastolic BP Percentile      Pulse      Resp      Temp      Temp src      SpO2      Weight      Height      Head Circumference      Peak Flow      Pain Score      Pain Loc      Pain Education      Exclude from Growth Chart    No data found.  Updated Vital Signs BP 119/71 (BP Location: Left Arm)   Pulse 94   Temp 98.6 F (37 C) (Oral)   Resp 18   SpO2 99%   Visual Acuity Right Eye Distance:   Left Eye Distance:   Bilateral Distance:    Right Eye Near:   Left Eye Near:    Bilateral Near:     Physical Exam Vitals reviewed.  Constitutional:      General: She is not in acute distress.    Appearance: Normal appearance. She is ill-appearing.  HENT:     Head: Normocephalic and atraumatic.     Right Ear: Tympanic membrane, ear canal and external ear normal. No tenderness. No middle ear effusion. There is no impacted cerumen. Tympanic membrane is not perforated, erythematous, retracted or bulging.     Left Ear: Tympanic membrane, ear canal and external ear normal. No tenderness.  No middle ear effusion. There is no impacted cerumen. Tympanic membrane is not perforated, erythematous, retracted or bulging.     Nose: Nose normal. No congestion.     Mouth/Throat:     Mouth: Mucous membranes are moist.     Pharynx: Uvula midline. Posterior oropharyngeal erythema present. No oropharyngeal exudate.     Tonsils: No tonsillar exudate. 1+ on the right. 1+ on the left.     Comments: Tonsils with erythema but no exudate. Eyes:     Extraocular Movements: Extraocular movements intact.     Pupils: Pupils are equal, round, and reactive to light.  Cardiovascular:     Rate and Rhythm: Normal rate and regular rhythm.     Heart sounds: Normal heart sounds.  Pulmonary:     Effort: Pulmonary effort is normal.     Breath sounds: Normal breath sounds. No decreased breath sounds, wheezing, rhonchi or rales.  Abdominal:     Palpations: Abdomen is soft.     Tenderness: There is no abdominal  tenderness. There is no guarding or rebound.  Lymphadenopathy:     Cervical: No cervical adenopathy.     Right cervical: No superficial, deep or posterior cervical adenopathy.  Left cervical: No superficial, deep or posterior cervical adenopathy.  Skin:    Comments: No rash   Neurological:     General: No focal deficit present.     Mental Status: She is alert and oriented to person, place, and time.  Psychiatric:        Mood and Affect: Mood normal.        Behavior: Behavior normal.        Thought Content: Thought content normal.        Judgment: Judgment normal.      UC Treatments / Results  Labs (all labs ordered are listed, but only abnormal results are displayed) Labs Reviewed  POCT INFLUENZA A/B - Abnormal; Notable for the following components:      Result Value   Influenza B, POC Positive (*)    All other components within normal limits    EKG   Radiology No results found.  Procedures Procedures (including critical care time)  Medications Ordered in UC Medications - No data to display  Initial Impression / Assessment and Plan / UC Course  I have reviewed the triage vital signs and the nursing notes.  Pertinent labs & imaging results that were available during my care of the patient were reviewed by me and considered in my medical decision making (see chart for details).     Patient is a pleasant 29 y.o. female presenting with influenza B.. The patient is afebrile and nontachycardic.  Last tylenol  was 0400 today (4 hours ago). OCP contraception.  Influenza B positive.  As below, I sent Tamiflu , Zofran  to have on hand, and Promethazine  DM for symptomatic relief.  Discussed appropriate dosage of acetaminophen  and ibuprofen .  Return precautions as below.  Final Clinical Impressions(s) / UC Diagnoses   Final diagnoses:  Influenza B     Discharge Instructions      -You have influenza (the flu) -We are treating this with a medication called Tamiflu ,  which is an antiviral.  This will help reduce the severity and duration of the fluid. -Take the Zofran  (ondansetron ) up to 3 times daily if you develop nausea and vomiting. Dissolve one pill under your tongue or between your teeth and your cheek -Promethazine  DM cough syrup for congestion/cough. This could make you drowsy, so take at night before bed. -Use tylenol  and/or ibuprofen  for fever reduction or bodyaches, if needed.  Follow the instructions on the bottle for appropriate dosage.  You can take both Tylenol  and ibuprofen ; you can take them at the same time, or alternate every few hours (for example, Tylenol , then 4 hours later ibuprofen , then 4 hours later Tylenol , etc.) ---->Viruses like the flu typically last 5 to 7 days.  After 7 days, your symptoms should be improving rather than worsening.  If your symptoms improve, and then worsen again, this is when we worry about a sinus infection or a lung infection, and you should return for additional care.  If at any point you develop fevers that do not reduce with Tylenol , shortness of breath, a cough productive of dark or red sputum, or other symptoms that concern you, seek additional care.      ED Prescriptions     Medication Sig Dispense Auth. Provider   oseltamivir  (TAMIFLU ) 75 MG capsule Take 1 capsule (75 mg total) by mouth every 12 (twelve) hours. 10 capsule Lydiah Pong E, PA-C   promethazine -dextromethorphan (PROMETHAZINE -DM) 6.25-15 MG/5ML syrup Take 5 mLs by mouth 4 (four) times daily as needed for cough. 118 mL  Loreda Silverio E, PA-C   ondansetron  (ZOFRAN -ODT) 4 MG disintegrating tablet Take 1 tablet (4 mg total) by mouth every 8 (eight) hours as needed for nausea or vomiting. 20 tablet Kalaya Infantino E, PA-C      PDMP not reviewed this encounter.     [1]  Social History Tobacco Use   Smoking status: Some Days    Current packs/day: 0.00    Types: Cigarettes    Last attempt to quit: 06/30/2016    Years since quitting: 8.4     Passive exposure: Never   Smokeless tobacco: Never  Vaping Use   Vaping status: Never Used  Substance Use Topics   Alcohol use: No    Alcohol/week: 0.0 standard drinks of alcohol   Drug use: No     Arlyss Leita BRAVO, PA-C 12/14/24 [id number]  "
